# Patient Record
Sex: Female | Born: 1940 | Race: Black or African American | Hispanic: No | Smoking: Never smoker
Health system: Southern US, Community
[De-identification: ages and names within clinical notes are randomized; demographics above are authoritative.]

## PROBLEM LIST (undated history)

## (undated) DIAGNOSIS — R7303 Prediabetes: Secondary | ICD-10-CM

## (undated) DIAGNOSIS — M549 Dorsalgia, unspecified: Secondary | ICD-10-CM

## (undated) DIAGNOSIS — E079 Disorder of thyroid, unspecified: Secondary | ICD-10-CM

## (undated) DIAGNOSIS — I10 Essential (primary) hypertension: Secondary | ICD-10-CM

## (undated) DIAGNOSIS — E039 Hypothyroidism, unspecified: Secondary | ICD-10-CM

## (undated) DIAGNOSIS — F419 Anxiety disorder, unspecified: Secondary | ICD-10-CM

## (undated) DIAGNOSIS — M67919 Unspecified disorder of synovium and tendon, unspecified shoulder: Secondary | ICD-10-CM

## (undated) DIAGNOSIS — N289 Disorder of kidney and ureter, unspecified: Secondary | ICD-10-CM

## (undated) DIAGNOSIS — H269 Unspecified cataract: Secondary | ICD-10-CM

## (undated) DIAGNOSIS — E785 Hyperlipidemia, unspecified: Secondary | ICD-10-CM

## (undated) DIAGNOSIS — M1990 Unspecified osteoarthritis, unspecified site: Secondary | ICD-10-CM

## (undated) HISTORY — DX: Disorder of thyroid, unspecified: E07.9

## (undated) HISTORY — DX: Unspecified osteoarthritis, unspecified site: M19.90

## (undated) HISTORY — DX: Essential (primary) hypertension: I10

## (undated) HISTORY — DX: Dorsalgia, unspecified: M54.9

## (undated) HISTORY — DX: Disorder of kidney and ureter, unspecified: N28.9

## (undated) HISTORY — DX: Unspecified cataract: H26.9

## (undated) HISTORY — DX: Hyperlipidemia, unspecified: E78.5

---

## 2007-09-30 ENCOUNTER — Other Ambulatory Visit: Admission: RE | Admit: 2007-09-30 | Discharge: 2007-09-30 | Payer: Self-pay | Admitting: Internal Medicine

## 2007-09-30 ENCOUNTER — Encounter: Admission: RE | Admit: 2007-09-30 | Discharge: 2007-09-30 | Payer: Self-pay | Admitting: Internal Medicine

## 2007-09-30 LAB — HM PAP SMEAR

## 2007-10-16 ENCOUNTER — Encounter: Admission: RE | Admit: 2007-10-16 | Discharge: 2007-10-16 | Payer: Self-pay | Admitting: Internal Medicine

## 2007-12-31 ENCOUNTER — Ambulatory Visit: Payer: Self-pay | Admitting: Internal Medicine

## 2008-02-28 ENCOUNTER — Ambulatory Visit: Payer: Self-pay | Admitting: Internal Medicine

## 2008-06-02 ENCOUNTER — Ambulatory Visit: Payer: Self-pay | Admitting: Internal Medicine

## 2008-06-30 ENCOUNTER — Ambulatory Visit: Payer: Self-pay | Admitting: Internal Medicine

## 2008-06-30 ENCOUNTER — Encounter: Admission: RE | Admit: 2008-06-30 | Discharge: 2008-06-30 | Payer: Self-pay | Admitting: Internal Medicine

## 2008-08-25 ENCOUNTER — Ambulatory Visit: Payer: Self-pay | Admitting: Internal Medicine

## 2008-09-28 ENCOUNTER — Ambulatory Visit: Payer: Self-pay | Admitting: Internal Medicine

## 2008-10-04 ENCOUNTER — Encounter: Admission: RE | Admit: 2008-10-04 | Discharge: 2008-10-04 | Payer: Self-pay | Admitting: Internal Medicine

## 2008-10-08 HISTORY — PX: JOINT REPLACEMENT: SHX530

## 2008-10-09 ENCOUNTER — Inpatient Hospital Stay (HOSPITAL_COMMUNITY): Admission: RE | Admit: 2008-10-09 | Discharge: 2008-10-12 | Payer: Self-pay | Admitting: Orthopedic Surgery

## 2008-12-29 ENCOUNTER — Ambulatory Visit: Payer: Self-pay | Admitting: Internal Medicine

## 2009-09-06 ENCOUNTER — Ambulatory Visit: Payer: Self-pay | Admitting: Internal Medicine

## 2009-10-30 ENCOUNTER — Ambulatory Visit: Payer: Self-pay | Admitting: Internal Medicine

## 2009-10-30 ENCOUNTER — Encounter: Admission: RE | Admit: 2009-10-30 | Discharge: 2009-10-30 | Payer: Self-pay | Admitting: Internal Medicine

## 2009-10-30 LAB — HM MAMMOGRAPHY

## 2010-04-01 ENCOUNTER — Encounter: Payer: Self-pay | Admitting: Internal Medicine

## 2010-04-22 ENCOUNTER — Ambulatory Visit (INDEPENDENT_AMBULATORY_CARE_PROVIDER_SITE_OTHER): Payer: Medicare Other | Admitting: Internal Medicine

## 2010-04-22 DIAGNOSIS — E785 Hyperlipidemia, unspecified: Secondary | ICD-10-CM

## 2010-04-22 DIAGNOSIS — IMO0002 Reserved for concepts with insufficient information to code with codable children: Secondary | ICD-10-CM

## 2010-04-22 DIAGNOSIS — E119 Type 2 diabetes mellitus without complications: Secondary | ICD-10-CM

## 2010-04-22 DIAGNOSIS — E039 Hypothyroidism, unspecified: Secondary | ICD-10-CM

## 2010-06-15 LAB — CBC
HCT: 24.7 % — ABNORMAL LOW (ref 36.0–46.0)
HCT: 25.8 % — ABNORMAL LOW (ref 36.0–46.0)
HCT: 29.1 % — ABNORMAL LOW (ref 36.0–46.0)
Hemoglobin: 9.9 g/dL — ABNORMAL LOW (ref 12.0–15.0)
MCHC: 33.8 g/dL (ref 30.0–36.0)
MCHC: 33.8 g/dL (ref 30.0–36.0)
MCHC: 34 g/dL (ref 30.0–36.0)
MCV: 98 fL (ref 78.0–100.0)
Platelets: 175 10*3/uL (ref 150–400)
Platelets: 180 10*3/uL (ref 150–400)
Platelets: 217 10*3/uL (ref 150–400)
RBC: 2.52 MIL/uL — ABNORMAL LOW (ref 3.87–5.11)
RBC: 2.64 MIL/uL — ABNORMAL LOW (ref 3.87–5.11)
RBC: 2.99 MIL/uL — ABNORMAL LOW (ref 3.87–5.11)
RDW: 13.3 % (ref 11.5–15.5)
RDW: 13.4 % (ref 11.5–15.5)
WBC: 8.8 10*3/uL (ref 4.0–10.5)

## 2010-06-15 LAB — BASIC METABOLIC PANEL
BUN: 12 mg/dL (ref 6–23)
BUN: 13 mg/dL (ref 6–23)
BUN: 15 mg/dL (ref 6–23)
BUN: 16 mg/dL (ref 6–23)
CO2: 28 mEq/L (ref 19–32)
CO2: 29 mEq/L (ref 19–32)
Calcium: 8 mg/dL — ABNORMAL LOW (ref 8.4–10.5)
Chloride: 102 mEq/L (ref 96–112)
Chloride: 98 mEq/L (ref 96–112)
Creatinine, Ser: 1.37 mg/dL — ABNORMAL HIGH (ref 0.4–1.2)
Creatinine, Ser: 1.54 mg/dL — ABNORMAL HIGH (ref 0.4–1.2)
GFR calc Af Amer: 46 mL/min — ABNORMAL LOW (ref >60)
GFR calc Af Amer: 54 mL/min — ABNORMAL LOW (ref >60)
GFR calc Af Amer: 57 mL/min — ABNORMAL LOW (ref >60)
GFR calc non Af Amer: 38 mL/min — ABNORMAL LOW (ref >60)
Glucose, Bld: 101 mg/dL — ABNORMAL HIGH (ref 70–99)
Glucose, Bld: 106 mg/dL — ABNORMAL HIGH (ref 70–99)
Glucose, Bld: 158 mg/dL — ABNORMAL HIGH (ref 70–99)
Potassium: 3.5 mEq/L (ref 3.5–5.1)
Potassium: 4.2 mEq/L (ref 3.5–5.1)
Sodium: 134 mEq/L — ABNORMAL LOW (ref 135–145)
Sodium: 137 mEq/L (ref 135–145)
Sodium: 137 mEq/L (ref 135–145)

## 2010-06-15 LAB — PROTIME-INR
INR: 1.1 (ref 0.00–1.49)
Prothrombin Time: 16.2 seconds — ABNORMAL HIGH (ref 11.6–15.2)
Prothrombin Time: 18.9 seconds — ABNORMAL HIGH (ref 11.6–15.2)

## 2010-06-15 LAB — TYPE AND SCREEN

## 2010-06-16 LAB — URINALYSIS, ROUTINE W REFLEX MICROSCOPIC
Leukocytes, UA: NEGATIVE
Nitrite: NEGATIVE
Specific Gravity, Urine: 1.007 (ref 1.005–1.030)
pH: 6 (ref 5.0–8.0)

## 2010-06-16 LAB — URINE MICROSCOPIC-ADD ON

## 2010-06-16 LAB — COMPREHENSIVE METABOLIC PANEL
Albumin: 3.6 g/dL (ref 3.5–5.2)
Calcium: 9.2 mg/dL (ref 8.4–10.5)
Chloride: 102 mEq/L (ref 96–112)
Creatinine, Ser: 1.32 mg/dL — ABNORMAL HIGH (ref 0.4–1.2)
GFR calc non Af Amer: 40 mL/min — ABNORMAL LOW (ref >60)
Total Bilirubin: 0.4 mg/dL (ref 0.3–1.2)

## 2010-06-16 LAB — CBC
Hemoglobin: 11.2 g/dL — ABNORMAL LOW (ref 12.0–15.0)
RDW: 13.6 % (ref 11.5–15.5)

## 2010-06-16 LAB — APTT: aPTT: 30 seconds (ref 24–37)

## 2010-07-23 NOTE — H&P (Signed)
[name], [name]                 ACCOUNT NO.:  [id number]   MEDICAL RECORD NO.:  [id number]          PATIENT TYPE:  INP   LOCATION:                               FACILITY:  [facility]   PHYSICIAN:  Ollen Gross, M.D.    DATE OF BIRTH:  22-Mar-1940   DATE OF ADMISSION:  10/09/2008  DATE OF DISCHARGE:                              HISTORY & PHYSICAL   CHIEF COMPLAINT:  Right hip pain.   HISTORY OF PRESENT ILLNESS:  The patient is a 70 year old female who has  been seen by Dr. Lequita Halt for ongoing right hip pain which has  progressively gotten worse over time.  It has been ongoing for several  years now, starting in the groin and radiating down to the thigh.  She  has been seen in the office and x-rays show severe end-stage arthritis  with bone on bone, subchondral cystic formation.  She also had MRI that  confirmed severe degenerative changes.  It is felt she has reached the  point where she would benefit from undergoing surgical intervention.  Risks and benefits have been discussed and she would like to proceed  with surgery.   ALLERGIES:  No known drug allergies.   INTOLERANCES:  LISINOPRIL causes cough, TOPROL causes cough.   FOOD ALLERGIES:  TOMATOES which cause rash and several FRUIT PUNCHES  which cause rash.   CURRENT MEDICATIONS:  Metoprolol, amlodipine, hydrochlorothiazide,  simvastatin, levothyroxine.   PAST MEDICAL HISTORY:  Hypertension, hypercholesterolemia,  hypothyroidism, postmenopausal.  Childhood illnesses of measles, mumps.   PAST SURGICAL HISTORY:  Tubal ligation.   FAMILY HISTORY:  Father with history of prostate cancer.  Mother with  hypertension and stroke.  She has four children.   SOCIAL HISTORY:  Separated, retired, nonsmoker.  She does have a  caregiver lined up after surgery.  She is going to be going home with  her daughter which has two steps entering into her home.  She has four  steps entering into her own home.   REVIEW OF SYSTEMS:  GENERAL:  No  fevers, chills or night sweats.  NEURO:  No seizures, syncope, paralysis.  RESPIRATORY:  She has a little bit of  shortness of breath on exertion.  No shortness of breath at rest.  The  patient states this is more due to pain than anything.  No productive  cough or hemoptysis.  CARDIOVASCULAR:  No chest pain, orthopnea.  GI:  No nausea, vomiting, diarrhea or constipation.  GU:  No dysuria,  hematuria or discharge.  MUSCULOSKELETAL:  Hip pain.   PHYSICAL EXAMINATION:  VITAL SIGNS:  Pulse 68, respirations 12, blood  pressure 162/84.  GENERAL:  A 70 year old [place] female well-nourished, well-  developed, in no acute distress.  She is alert and oriented,  cooperative, pleasant, accompanied by her sister and daughter.  HEENT:  Normocephalic, atraumatic.  Pupils are round and reactive.  Noted to wear reading glasses.  EOMs intact.  NECK:  Supple.  CHEST:  Clear.  HEART:  Regular rate and rhythm.  No murmur.  S1-S2 noted.  ABDOMEN:  Soft, round, protuberant  abdomen.  Bowel sounds present.  RECTUM/BREASTS/GENITALIA:  Not done, not pertinent to present illness.  EXTREMITIES:  Right hip flexion 90-0, internal rotation 0, external  rotation about 20 degrees of abduction.  Right knee normal exam.   IMPRESSION:  Osteoarthritis right hip.   PLAN:  The patient will be admitted to [facility] to undergo a  right total hip replacement arthroplasty.  Surgery will be performed by  Dr. Ollen Gross.      [name], P.A.C.      Ollen Gross, M.D.  Electronically Signed    ALP/MEDQ  D:  10/08/2008  T:  10/08/2008  Job:  [id number]

## 2010-07-23 NOTE — Op Note (Signed)
NAMESHAROLYN, [name]                 ACCOUNT NO.:  [id number]   MEDICAL RECORD NO.:  [id number]          PATIENT TYPE:  INP   LOCATION:  0012                         FACILITY:  [facility]   PHYSICIAN:  Ollen Gross, M.D.    DATE OF BIRTH:  [birth year]   DATE OF PROCEDURE:  10/09/2008  DATE OF DISCHARGE:                               OPERATIVE REPORT   PREOPERATIVE DIAGNOSIS:  Osteoarthritis, right hip.   POSTOPERATIVE DIAGNOSIS:  Osteoarthritis, right hip.   PROCEDURE:  Right total hip arthroplasty.   SURGEON:  Aluisio.   ASSISTANT:  Rozell Searing, PA-C.   ANESTHESIA:  General.   BLOOD LOSS:  500.   DRAINS:  Hemovac x1.   COMPLICATIONS:  None.   CONDITION:  Stable to recovery.   CLINICAL NOTE:  [name] is a 70 year old female with end-stage  arthritis of the right hip with progressively worsening pain and  dysfunction.  She has failed nonoperative management and presents for  total hip arthroplasty.   PROCEDURE IN DETAIL:  After successful administration of general  anesthetic, the patient is placed in the left lateral decubitus position  with the right side up and held with the hip positioner.  Right lower  extremity is isolated from her perineum with plastic drapes and prepped  and draped in the usual sterile fashion.  A short posterolateral  incision is made with a 10 blade through the skin and then subcutaneous  tissue is incised down to the level of the fascia lata.  This is a very  thick layer of subcutaneous tissue.  Fascia lata was incised in line  with the skin incision.  The sciatic nerve is palpated and protected,  and the short rotators isolated off the femur.  Capsulotomy was  performed, and the hip is dislocated.  The center of the femoral head is  marked.  A trial prosthesis was placed, such that the center of the  trial head corresponds to the center of her native femoral head.  Osteotomy lines are marked on the femoral neck, and osteotomy made with  an  oscillating saw.  The femoral head is removed, and the femur is  retracted anteriorly to gain acetabular exposure.   Acetabular retractors were placed in the labrum, and osteophytes were  removed.  Acetabular reaming was initiated, starting at 45 mm, coursing  in increments of 2 to 51 mm, and then a 52-mm Pinnacle acetabular shell  was placed in anatomic position and transfixed with 2 dome screws.  The  32 mm neutral +4 liner was placed.   The femur was repaired with the canal finder and irrigation.  Axial  reaming was performed at 13.5 mm proximal, reaming to an 18.5 and the  sleeve machine to a large.  The 18.5 large trial sleeve was placed, and  the 18 x 13 stem, and a 36 + 8 neck, matching her native anteversion.  A  32 +0 head is placed.  The hip is reduced fairly easily using a 32 + 3  head, which had great tension.  There is excellent stability with full  extension,  full external rotation to 70 degrees flexion, 40 degrees  adduction, 90 degrees internal rotation, 90 degrees of flexion, and 70  degrees of internal rotation.  By placing the right leg on top of the  left, it felt as though the leg lengths were equal.  The hip was then  dislocated, and trials were removed.  The permanent apex hole eliminator  was placed in the acetabular shell, and permanent 32 mm neutral +4  Marathon liner was placed.  On the femoral side, the 18.5 large sleeve  is placed with the 18 x 13 stem, 36 +8 neck, matching native  anteversion.  The 32 +3 head is placed, and the hip is reduced with the  same stability parameters.  The wound was copiously irrigated with  saline solution, and then the short rotators were reattached to the  femur through drill holes.  The fascia lata is then closed over a  Hemovac drain with interrupted #1 Vicryl.  Subcu closed with #1-0 and #2-  0 Vicryl, and subcuticular running 4-0 Monocryl.  The incision is then  cleaned and dried, and Steri-Strips and a bulky sterile  dressing are  applied.  She is then placed into a knee immobilizer, awakened, and  transported to recovery in stable condition.      Ollen Gross, M.D.  Electronically Signed     FA/MEDQ  D:  10/09/2008  T:  10/09/2008  Job:  [id number]

## 2010-09-05 ENCOUNTER — Other Ambulatory Visit: Payer: Self-pay | Admitting: *Deleted

## 2010-09-05 MED ORDER — METOPROLOL SUCCINATE ER 25 MG PO TB24: 25.0000 mg | ORAL_TABLET | Freq: Every day | ORAL | Status: DC

## 2010-09-05 MED ORDER — AMLODIPINE BESYLATE 10 MG PO TABS: 10.0000 mg | ORAL_TABLET | Freq: Every day | ORAL | Status: DC

## 2010-09-16 ENCOUNTER — Other Ambulatory Visit: Payer: Self-pay | Admitting: *Deleted

## 2010-09-16 MED ORDER — SIMVASTATIN 10 MG PO TABS: 10.0000 mg | ORAL_TABLET | Freq: Every evening | ORAL | Status: DC

## 2010-09-16 MED ORDER — LEVOTHYROXINE SODIUM 75 MCG PO TABS: 75.0000 ug | ORAL_TABLET | Freq: Every day | ORAL | Status: DC

## 2010-10-18 ENCOUNTER — Encounter: Payer: Self-pay | Admitting: Internal Medicine

## 2010-10-21 ENCOUNTER — Encounter: Payer: Self-pay | Admitting: Internal Medicine

## 2010-10-21 ENCOUNTER — Ambulatory Visit (INDEPENDENT_AMBULATORY_CARE_PROVIDER_SITE_OTHER): Payer: Medicare Other | Admitting: Internal Medicine

## 2010-10-21 DIAGNOSIS — X58XXXA Exposure to other specified factors, initial encounter: Secondary | ICD-10-CM

## 2010-10-21 DIAGNOSIS — IMO0002 Reserved for concepts with insufficient information to code with codable children: Secondary | ICD-10-CM

## 2010-10-21 DIAGNOSIS — M1990 Unspecified osteoarthritis, unspecified site: Secondary | ICD-10-CM

## 2010-10-21 DIAGNOSIS — M169 Osteoarthritis of hip, unspecified: Secondary | ICD-10-CM | POA: Insufficient documentation

## 2010-10-21 DIAGNOSIS — E785 Hyperlipidemia, unspecified: Secondary | ICD-10-CM

## 2010-10-21 DIAGNOSIS — T148XXA Other injury of unspecified body region, initial encounter: Secondary | ICD-10-CM

## 2010-10-21 DIAGNOSIS — E119 Type 2 diabetes mellitus without complications: Secondary | ICD-10-CM

## 2010-10-21 DIAGNOSIS — E039 Hypothyroidism, unspecified: Secondary | ICD-10-CM

## 2010-10-21 DIAGNOSIS — I10 Essential (primary) hypertension: Secondary | ICD-10-CM

## 2010-10-21 LAB — HEPATIC FUNCTION PANEL
AST: 23 U/L (ref 0–37)
Alkaline Phosphatase: 57 U/L (ref 39–117)
Bilirubin, Direct: 0.1 mg/dL (ref 0.0–0.3)
Indirect Bilirubin: 0.4 mg/dL (ref 0.0–0.9)
Total Bilirubin: 0.5 mg/dL (ref 0.3–1.2)

## 2010-10-21 LAB — HEMOGLOBIN A1C: Hgb A1c MFr Bld: 6.2 % — ABNORMAL HIGH (ref <5.7)

## 2010-10-21 MED ORDER — TETANUS-DIPHTH-ACELL PERTUSSIS 5-2.5-18.5 LF-MCG/0.5 IM SUSP
0.5000 mL | Freq: Once | INTRAMUSCULAR | Status: AC
  Administered 2010-10-21: 0.5 mL via INTRAMUSCULAR

## 2010-10-21 NOTE — Progress Notes (Signed)
  Subjective:    Patient ID: [name], female    DOB: Mar 03, 1941, 70 y.o.   MRN: [id number]  HPI 70 year old black female with history of hypertension, hypothyroidism, osteoarthritis of back and knees, hyperlipidemia, mild renal insufficiency, adult onset diabetes mellitus in today for six-month recheck. She shows me a laceration tip of her left third finger that happened last Thursday, August 9 which she was using some hedge clippers. Likely needed sutures at that time but she did not seek medical attention. Tdap vaccine given today.    Review of Systems     Objective:   Physical Exam 1 cm laceration tip of left third finger. No evidence of secondary bacterial infection. Area around laceration is macerated.        Assessment & Plan:  Laceration left third finger distal tip  Tdap vaccine given. Duricef 500 mg daily for 7 days. Recheck in one week. He is to clean laceration twice daily with peroxide followed by small amount of Bactroban ointment. Given prescription for Bactroban ointment with prn one year refill. Recheck in one week and we'll address other medical problems at that time.

## 2010-10-22 ENCOUNTER — Other Ambulatory Visit: Payer: Self-pay | Admitting: Internal Medicine

## 2010-10-22 DIAGNOSIS — Z1231 Encounter for screening mammogram for malignant neoplasm of breast: Secondary | ICD-10-CM

## 2010-10-28 ENCOUNTER — Ambulatory Visit: Payer: Medicare Other | Admitting: Internal Medicine

## 2010-10-31 ENCOUNTER — Encounter: Payer: Self-pay | Admitting: Internal Medicine

## 2010-10-31 ENCOUNTER — Ambulatory Visit (INDEPENDENT_AMBULATORY_CARE_PROVIDER_SITE_OTHER): Payer: Medicare Other | Admitting: Internal Medicine

## 2010-10-31 VITALS — BP 130/80 | HR 70 | Temp 97.5°F | Ht 63.5 in | Wt 237.0 lb

## 2010-10-31 DIAGNOSIS — N289 Disorder of kidney and ureter, unspecified: Secondary | ICD-10-CM

## 2010-10-31 DIAGNOSIS — E039 Hypothyroidism, unspecified: Secondary | ICD-10-CM

## 2010-10-31 DIAGNOSIS — I10 Essential (primary) hypertension: Secondary | ICD-10-CM

## 2010-10-31 DIAGNOSIS — M545 Low back pain, unspecified: Secondary | ICD-10-CM

## 2010-10-31 DIAGNOSIS — E119 Type 2 diabetes mellitus without complications: Secondary | ICD-10-CM

## 2010-10-31 DIAGNOSIS — E785 Hyperlipidemia, unspecified: Secondary | ICD-10-CM

## 2010-11-07 ENCOUNTER — Other Ambulatory Visit: Payer: Self-pay | Admitting: *Deleted

## 2010-11-07 MED ORDER — AMLODIPINE BESYLATE 10 MG PO TABS: 10.0000 mg | ORAL_TABLET | Freq: Every day | ORAL | Status: DC

## 2010-11-07 MED ORDER — METOPROLOL SUCCINATE ER 25 MG PO TB24: 25.0000 mg | ORAL_TABLET | Freq: Every day | ORAL | Status: DC

## 2010-11-22 ENCOUNTER — Other Ambulatory Visit: Payer: Self-pay

## 2010-11-22 MED ORDER — SIMVASTATIN 10 MG PO TABS: 10.0000 mg | ORAL_TABLET | Freq: Every evening | ORAL | Status: DC

## 2010-12-01 DIAGNOSIS — M545 Low back pain: Secondary | ICD-10-CM | POA: Insufficient documentation

## 2010-12-01 NOTE — Progress Notes (Signed)
  Subjective:    Patient ID: [name], female    DOB: 04-Nov-1940, 70 y.o.   MRN: [id number]  HPI for followup of laceration distal left third finger which occurred August 19 for which she sought treatment here August 13. She was treated with Duricef and Bactroban. It is  healing nicely. No evidence of secondary infection. Also for six-month recheck. Blood pressure is stable on current regimen. History of hypothyroidism. TSH checked and is within normal limits on Synthroid 0.075 mg daily. History of hyperlipidemia treated with Zocor. Patient is status post right hip replacement August 2010. Has osteoarthritis in her back as well. Mild renal insufficiency and diabetes mellitus. Had urinary tract infection July 2009. Last mammogram on file August 2011. Patient refuses influenza and Pneumovax immunizations. No known drug allergies. Prinivil causes a cough.    Review of Systems     Objective:   Physical Exam neck is supple without thyromegaly JVD or carotid bruits. Chest clear to auscultation; cardiac exam regular rate and rhythm; extremities no pitting edema.        Assessment & Plan:  Hypertension  Hyperlipidemia  Hypothyroidism  Back pain  Mild renal insufficiency  Adult onset diabetes mellitus  Healing laceration distal left third finger-laceration occurred August 9  Plan is to return in 6 months for physical exam and fasting lab work

## 2010-12-17 ENCOUNTER — Other Ambulatory Visit: Payer: Self-pay | Admitting: *Deleted

## 2010-12-17 MED ORDER — LEVOTHYROXINE SODIUM 75 MCG PO TABS: 75.0000 ug | ORAL_TABLET | Freq: Every day | ORAL | Status: DC

## 2011-04-29 DIAGNOSIS — H40019 Open angle with borderline findings, low risk, unspecified eye: Secondary | ICD-10-CM | POA: Diagnosis not present

## 2011-04-29 DIAGNOSIS — H5710 Ocular pain, unspecified eye: Secondary | ICD-10-CM | POA: Diagnosis not present

## 2011-04-29 DIAGNOSIS — H353190 Nonexudative age-related macular degeneration, unspecified eye, stage unspecified: Secondary | ICD-10-CM | POA: Diagnosis not present

## 2011-04-29 DIAGNOSIS — H2510 Age-related nuclear cataract, unspecified eye: Secondary | ICD-10-CM | POA: Diagnosis not present

## 2011-05-26 ENCOUNTER — Ambulatory Visit (INDEPENDENT_AMBULATORY_CARE_PROVIDER_SITE_OTHER): Payer: Medicare Other | Admitting: Internal Medicine

## 2011-05-26 ENCOUNTER — Encounter: Payer: Self-pay | Admitting: Internal Medicine

## 2011-05-26 ENCOUNTER — Other Ambulatory Visit (HOSPITAL_COMMUNITY)
Admission: RE | Admit: 2011-05-26 | Discharge: 2011-05-26 | Disposition: A | Discharge status: Home / Self Care | Payer: Medicare Other | Source: Ambulatory Visit | Attending: Internal Medicine | Admitting: Internal Medicine

## 2011-05-26 VITALS — BP 146/86 | HR 76 | Temp 97.7°F | Ht 63.25 in | Wt 237.0 lb

## 2011-05-26 DIAGNOSIS — E559 Vitamin D deficiency, unspecified: Secondary | ICD-10-CM | POA: Diagnosis not present

## 2011-05-26 DIAGNOSIS — E039 Hypothyroidism, unspecified: Secondary | ICD-10-CM

## 2011-05-26 DIAGNOSIS — I10 Essential (primary) hypertension: Secondary | ICD-10-CM | POA: Diagnosis not present

## 2011-05-26 DIAGNOSIS — Z0000 Encounter for general adult medical examination without abnormal findings: Secondary | ICD-10-CM | POA: Diagnosis not present

## 2011-05-26 DIAGNOSIS — Z124 Encounter for screening for malignant neoplasm of cervix: Secondary | ICD-10-CM

## 2011-05-26 DIAGNOSIS — E785 Hyperlipidemia, unspecified: Secondary | ICD-10-CM

## 2011-05-26 DIAGNOSIS — R319 Hematuria, unspecified: Secondary | ICD-10-CM | POA: Diagnosis not present

## 2011-05-26 DIAGNOSIS — E119 Type 2 diabetes mellitus without complications: Secondary | ICD-10-CM | POA: Diagnosis not present

## 2011-05-26 LAB — COMPREHENSIVE METABOLIC PANEL
AST: 24 U/L (ref 0–37)
Alkaline Phosphatase: 66 U/L (ref 39–117)
BUN: 22 mg/dL (ref 6–23)
Glucose, Bld: 85 mg/dL (ref 70–99)
Potassium: 4.5 mEq/L (ref 3.5–5.3)
Sodium: 143 mEq/L (ref 135–145)
Total Bilirubin: 0.6 mg/dL (ref 0.3–1.2)

## 2011-05-26 LAB — CBC WITH DIFFERENTIAL/PLATELET
Basophils Absolute: 0 10*3/uL (ref 0.0–0.1)
Basophils Relative: 0 % (ref 0–1)
Eosinophils Absolute: 0.1 10*3/uL (ref 0.0–0.7)
Hemoglobin: 12.8 g/dL (ref 12.0–15.0)
MCH: 31.8 pg (ref 26.0–34.0)
MCHC: 33.6 g/dL (ref 30.0–36.0)
Monocytes Absolute: 0.5 10*3/uL (ref 0.1–1.0)
Monocytes Relative: 10 % (ref 3–12)
Neutro Abs: 2 10*3/uL (ref 1.7–7.7)
Neutrophils Relative %: 41 % — ABNORMAL LOW (ref 43–77)
RBC: 4.02 MIL/uL (ref 3.87–5.11)
RDW: 13.9 % (ref 11.5–15.5)
WBC: 4.8 10*3/uL (ref 4.0–10.5)

## 2011-05-26 LAB — LIPID PANEL
HDL: 50 mg/dL (ref >39)
LDL Cholesterol: 110 mg/dL — ABNORMAL HIGH (ref 0–99)
Triglycerides: 100 mg/dL (ref <150)
VLDL: 20 mg/dL (ref 0–40)

## 2011-05-26 LAB — POCT URINALYSIS DIPSTICK
Bilirubin, UA: NEGATIVE
Glucose, UA: NEGATIVE
Ketones, UA: NEGATIVE
Spec Grav, UA: 1.02
pH, UA: 5.5

## 2011-05-26 LAB — TSH: TSH: 2.875 u[IU]/mL (ref 0.350–4.500)

## 2011-05-28 LAB — URINE CULTURE

## 2011-06-09 DIAGNOSIS — E119 Type 2 diabetes mellitus without complications: Secondary | ICD-10-CM | POA: Insufficient documentation

## 2011-06-09 NOTE — Patient Instructions (Signed)
Continue same medications and return in 6 months 

## 2011-06-09 NOTE — Progress Notes (Signed)
  Subjective:    Patient ID: [name], female    DOB: [birth year], 71 y.o.   MRN: [id number]  HPI 71 year old black female mother of [name] in today for health maintenance and evaluation of medical problems. Patient refuses flu and Pneumovax immunization. Does not want to do colonoscopy. Agrees to do 3 Hemoccult cards. History of hypertension, hypothyroidism, low back pain, hyperlipidemia, chronic kidney disease, diabetes mellitus. Had right hip replacement August 2010. Had mammogram August 2011. Prinivil causes a cough. Diabetes is diet controlled at the present time. She is on statin for hyperlipidemia.  No history of serious illnesses accidents or operations prior to my assuming her care in 2009. She is a retired from an Horticulturist, commercial.  Family history: Father died at age 78 of cancer of the prostate. Mother died at age 72 of stroke with history of hypertension. One brother with history of lung cancer. Patient does not smoke or consume alcohol. She is married and completed high school.    Review of Systems noncontributory except as above     Objective:   Physical Exam  Constitutional: She is oriented to person, place, and time. She appears well-nourished. No distress.  HENT:  Head: Normocephalic and atraumatic.  Right Ear: External ear normal.  Left Ear: External ear normal.  Mouth/Throat: Oropharynx is clear and moist. No oropharyngeal exudate.  Eyes: Conjunctivae and EOM are normal. Pupils are equal, round, and reactive to light. Right eye exhibits no discharge. No scleral icterus.  Neck: Neck supple. No JVD present. No thyromegaly present.  Cardiovascular: Normal rate, regular rhythm, normal heart sounds and intact distal pulses.   No murmur heard. Pulmonary/Chest: Effort normal and breath sounds normal. No respiratory distress. She has no wheezes. She has no rales. She exhibits no tenderness.  Abdominal: Soft. Bowel sounds are normal. She exhibits no  distension and no mass. There is no tenderness. There is no rebound and no guarding.  Genitourinary:       Deferred  Musculoskeletal: Normal range of motion. She exhibits no edema.  Lymphadenopathy:    She has no cervical adenopathy.  Neurological: She is alert and oriented to person, place, and time. She has normal reflexes. She displays normal reflexes. No cranial nerve deficit. Coordination normal.  Skin: Skin is warm and dry. No rash noted. She is not diaphoretic.  Psychiatric: She has a normal mood and affect. Her behavior is normal. Judgment and thought content normal.          Assessment & Plan:  Hypertension  Adult onset diabetes diet controlled  Chronic kidney disease stage I  Hyperlipidemia  Hypothyroidism  History of low back pain  Plan: Patient is to return in 6 months and continue same medications.

## 2011-07-03 ENCOUNTER — Other Ambulatory Visit: Payer: Self-pay

## 2011-07-03 MED ORDER — FUROSEMIDE 20 MG PO TABS: 20.0000 mg | ORAL_TABLET | Freq: Two times a day (BID) | ORAL | Status: DC

## 2011-12-01 ENCOUNTER — Ambulatory Visit (INDEPENDENT_AMBULATORY_CARE_PROVIDER_SITE_OTHER): Payer: Medicare Other | Admitting: Internal Medicine

## 2011-12-01 ENCOUNTER — Other Ambulatory Visit: Payer: Self-pay | Admitting: Internal Medicine

## 2011-12-01 ENCOUNTER — Encounter: Payer: Self-pay | Admitting: Internal Medicine

## 2011-12-01 VITALS — BP 148/84 | HR 76 | Ht 63.0 in | Wt 234.0 lb

## 2011-12-01 DIAGNOSIS — E119 Type 2 diabetes mellitus without complications: Secondary | ICD-10-CM | POA: Diagnosis not present

## 2011-12-01 DIAGNOSIS — Z79899 Other long term (current) drug therapy: Secondary | ICD-10-CM

## 2011-12-01 DIAGNOSIS — E785 Hyperlipidemia, unspecified: Secondary | ICD-10-CM

## 2011-12-01 DIAGNOSIS — E039 Hypothyroidism, unspecified: Secondary | ICD-10-CM | POA: Diagnosis not present

## 2011-12-01 LAB — HEPATIC FUNCTION PANEL
AST: 23 U/L (ref 0–37)
Alkaline Phosphatase: 59 U/L (ref 39–117)
Indirect Bilirubin: 0.4 mg/dL (ref 0.0–0.9)
Total Protein: 7.9 g/dL (ref 6.0–8.3)

## 2011-12-01 LAB — LIPID PANEL
Cholesterol: 187 mg/dL (ref 0–200)
LDL Cholesterol: 115 mg/dL — ABNORMAL HIGH (ref 0–99)
Triglycerides: 115 mg/dL (ref <150)

## 2011-12-01 NOTE — Progress Notes (Signed)
  Subjective:    Patient ID: [name], female    DOB: 1940-12-26, 71 y.o.   MRN: [id number]  HPI  71 year old Black female for 6 month recheck.  Having dependent edema right ankle greater than left. Ankles painful. Has not had BP meds this am; so of course legs are swollen somewhat. Drove up from [facility] this am. No other complaints. History of Hypertension, Hyperlipidemia and Hypothyroidism. Declines influenza immunization, pneumovax, Zostavax. Declines colonoscopy but has done 3 hemoccult cards from home previously. Wants refill on Vicodin for musculoskeletal pain in hips and knees. History of right hip replacement    Review of Systems     Objective:   Physical Exam Neck supple without  bruits; Chest clear; Cor: RRR,   normal S1/S2 without murmur   Ext: trace pitting edema. Skin warm and dry. Knees without effusions but both have osteoarthritic changes        Assessment & Plan:  Dependent edema HTN- has not had antihypertensive meds or diuretic this am Hypothyroidism-TSH pending Hyperlipidemia-fasting lipid panel pending Osteoarthritis  Plan: Increase Lasix to 40 mg daily. Continue other meds. Return in one month for BP check and potassium check Vicodin 5/500 one po q 8 hours prn pain with 2 refills.

## 2011-12-01 NOTE — Patient Instructions (Addendum)
Increase Furosemide to 40 mg daily from 20 mg daily.Return in one month.

## 2011-12-02 LAB — TSH: TSH: 4.095 u[IU]/mL (ref 0.350–4.500)

## 2011-12-09 ENCOUNTER — Other Ambulatory Visit: Payer: Self-pay

## 2011-12-09 MED ORDER — SIMVASTATIN 10 MG PO TABS: 10.0000 mg | ORAL_TABLET | Freq: Every evening | ORAL | Status: DC

## 2011-12-29 ENCOUNTER — Other Ambulatory Visit: Payer: Self-pay

## 2011-12-29 MED ORDER — AMLODIPINE BESYLATE 10 MG PO TABS: 10.0000 mg | ORAL_TABLET | Freq: Every day | ORAL | Status: DC

## 2011-12-29 MED ORDER — HYDROCODONE-ACETAMINOPHEN 5-500 MG PO TABS: 1.0000 | ORAL_TABLET | Freq: Four times a day (QID) | ORAL | Status: DC | PRN

## 2012-01-05 ENCOUNTER — Ambulatory Visit
Admission: RE | Admit: 2012-01-05 | Discharge: 2012-01-05 | Disposition: A | Discharge status: Home / Self Care | Payer: Medicare Other | Source: Ambulatory Visit | Attending: Internal Medicine | Admitting: Internal Medicine

## 2012-01-05 ENCOUNTER — Ambulatory Visit (INDEPENDENT_AMBULATORY_CARE_PROVIDER_SITE_OTHER): Payer: Medicare Other | Admitting: Internal Medicine

## 2012-01-05 ENCOUNTER — Encounter: Payer: Self-pay | Admitting: Internal Medicine

## 2012-01-05 VITALS — BP 138/76 | HR 76 | Temp 96.0°F | Wt 233.5 lb

## 2012-01-05 DIAGNOSIS — I10 Essential (primary) hypertension: Secondary | ICD-10-CM | POA: Diagnosis not present

## 2012-01-05 DIAGNOSIS — R52 Pain, unspecified: Secondary | ICD-10-CM | POA: Diagnosis not present

## 2012-01-05 DIAGNOSIS — M25571 Pain in right ankle and joints of right foot: Secondary | ICD-10-CM

## 2012-01-05 DIAGNOSIS — M25579 Pain in unspecified ankle and joints of unspecified foot: Secondary | ICD-10-CM | POA: Diagnosis not present

## 2012-01-05 DIAGNOSIS — R609 Edema, unspecified: Secondary | ICD-10-CM | POA: Insufficient documentation

## 2012-01-05 DIAGNOSIS — E039 Hypothyroidism, unspecified: Secondary | ICD-10-CM

## 2012-01-05 LAB — BASIC METABOLIC PANEL WITH GFR
BUN: 16 mg/dL (ref 6–23)
CO2: 27 meq/L (ref 19–32)
Calcium: 9.5 mg/dL (ref 8.4–10.5)
Chloride: 101 meq/L (ref 96–112)
Creat: 1.39 mg/dL — ABNORMAL HIGH (ref 0.50–1.10)
Glucose, Bld: 88 mg/dL (ref 70–99)
Potassium: 3.9 meq/L (ref 3.5–5.3)
Sodium: 139 meq/L (ref 135–145)

## 2012-01-05 LAB — TSH: TSH: 2.795 u[IU]/mL (ref 0.350–4.500)

## 2012-01-05 NOTE — Patient Instructions (Addendum)
Ankle x-ray shows no subluxation or fracture. Continue Lasix 40 mg daily. Continue other medications as review sleep prescribed. TSH redrawn today. Potassium checked today. Return in 6 months.

## 2012-01-05 NOTE — Progress Notes (Signed)
  Subjective:    Patient ID: [name], female    DOB: [birth year], 71 y.o.   MRN: [id number]  HPI 71 year old black female with history of hypertension, dependent edema, hypothyroidism in today for four-week followup. Last visit blood pressure was elevated. She had not been taking her blood pressure medication prior to coming to the office. Apparently she had run out. She was complaining of right lower extremity edema. Has always had some left lower extremity edema after injuring her left ankle. We increased Lasix from 20-40 mg daily. Also at last visit TSH was in the 4.0 range. She denied noncompliance with thyroid replacement therapy but has been taking a generic preparation. We will repeat TSH today along with the meds that she is on an increased dose of Lasix. She also complaining of pain in her right medial ankle. Doesn't recall any recent injury but says it hurts often.    Review of Systems     Objective:   Physical Exam edema has improved in both ankles. Or swelling in left ankle than right. Very tender along right medial malleolus.        Assessment & Plan:  Rule out subtle fracture right medial malleolus with x-ray-x-ray shows no fracture or subluxation  Dependent edema now on increased dose of Lasix 40 mg daily. Check for hypokalemia with b- met done today Hypertension-stable and improved  Hypothyroidism-TSH repeated today. Previous TSH was in the 4.0 range. Has been getting generic thyroid replacement.  Right ankle pain. 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 She had right hip replacement previously. Ankle pain could be related to that. Advise taking Vicodin sparingly on a daily basis. She waits until the pain is unbearable before taking pain medication. She should just take it daily. Also gave her Ace wrap to wrap her ankle when necessary.  Plan: Patient return in 6 months for physical examination. Advise further after reviewing labs.

## 2012-01-06 ENCOUNTER — Other Ambulatory Visit: Payer: Self-pay

## 2012-01-06 MED ORDER — POTASSIUM CHLORIDE ER 10 MEQ PO TBCR: 10.0000 meq | EXTENDED_RELEASE_TABLET | Freq: Every day | ORAL | Status: DC

## 2012-01-22 ENCOUNTER — Other Ambulatory Visit: Payer: Self-pay

## 2012-01-22 MED ORDER — AZITHROMYCIN 250 MG PO TABS: ORAL_TABLET | ORAL | Status: DC

## 2012-02-27 ENCOUNTER — Other Ambulatory Visit: Payer: Self-pay

## 2012-02-27 MED ORDER — LEVOTHYROXINE SODIUM 75 MCG PO TABS: 75.0000 ug | ORAL_TABLET | Freq: Every day | ORAL | Status: DC

## 2012-05-07 ENCOUNTER — Ambulatory Visit (INDEPENDENT_AMBULATORY_CARE_PROVIDER_SITE_OTHER): Payer: Medicare Other | Admitting: Internal Medicine

## 2012-05-07 ENCOUNTER — Other Ambulatory Visit (HOSPITAL_COMMUNITY): Payer: Self-pay | Admitting: Cardiovascular Disease

## 2012-05-07 ENCOUNTER — Encounter: Payer: Self-pay | Admitting: Internal Medicine

## 2012-05-07 VITALS — BP 122/86 | HR 76 | Temp 97.1°F | Ht 63.0 in | Wt 236.0 lb

## 2012-05-07 DIAGNOSIS — E039 Hypothyroidism, unspecified: Secondary | ICD-10-CM

## 2012-05-07 DIAGNOSIS — R7301 Impaired fasting glucose: Secondary | ICD-10-CM | POA: Diagnosis not present

## 2012-05-07 DIAGNOSIS — I10 Essential (primary) hypertension: Secondary | ICD-10-CM

## 2012-05-07 DIAGNOSIS — E782 Mixed hyperlipidemia: Secondary | ICD-10-CM | POA: Diagnosis not present

## 2012-05-07 DIAGNOSIS — R079 Chest pain, unspecified: Secondary | ICD-10-CM

## 2012-05-07 DIAGNOSIS — E119 Type 2 diabetes mellitus without complications: Secondary | ICD-10-CM | POA: Diagnosis not present

## 2012-05-07 DIAGNOSIS — R609 Edema, unspecified: Secondary | ICD-10-CM

## 2012-05-07 LAB — HEMOGLOBIN A1C: Hgb A1c MFr Bld: 5.7 % — ABNORMAL HIGH (ref <5.7)

## 2012-05-07 LAB — CBC WITH DIFFERENTIAL/PLATELET
HCT: 37.9 % (ref 36.0–46.0)
Hemoglobin: 13.2 g/dL (ref 12.0–15.0)
Lymphs Abs: 2.1 10*3/uL (ref 0.7–4.0)
Monocytes Absolute: 0.4 10*3/uL (ref 0.1–1.0)
Monocytes Relative: 8 % (ref 3–12)
Neutro Abs: 2.6 10*3/uL (ref 1.7–7.7)
Neutrophils Relative %: 50 % (ref 43–77)
RBC: 4.14 MIL/uL (ref 3.87–5.11)

## 2012-05-07 LAB — COMPREHENSIVE METABOLIC PANEL
Albumin: 4.3 g/dL (ref 3.5–5.2)
Alkaline Phosphatase: 71 U/L (ref 39–117)
BUN: 28 mg/dL — ABNORMAL HIGH (ref 6–23)
CO2: 26 mEq/L (ref 19–32)
Calcium: 9.4 mg/dL (ref 8.4–10.5)
Chloride: 98 mEq/L (ref 96–112)
Glucose, Bld: 80 mg/dL (ref 70–99)
Potassium: 3.9 mEq/L (ref 3.5–5.3)
Sodium: 139 mEq/L (ref 135–145)
Total Protein: 8.8 g/dL — ABNORMAL HIGH (ref 6.0–8.3)

## 2012-05-07 LAB — TSH: TSH: 2.875 u[IU]/mL (ref 0.350–4.500)

## 2012-05-07 NOTE — Progress Notes (Signed)
Subjective:    Patient ID: [name], female    DOB: 09-20-1940, 72 y.o.   MRN: [id number]  HPI 72 year old black female with history of hypertension, dependent edema, hypothyroidism who had an episode of right anterior chest pain early February. It awakened her from sleep and initially started below her right breast. It spread up over her right breast in her right anterior chest and neck. She said the pain was very sharp but did not last long. She got up to drink some water which she said relieved it. After that she got a headache. Says she had another similar less severe episode sometime in January. Once again it awakened her from sleep and had a similar distribution. She is worried about this. Her daughter who works as an Glass blower/designer for low back or is planning to have orthopedic surgery March 26 and wants this taken care of right away. Patient thinks is related to taking potassium supplement. She says potassium supplement gives her flatus .She is on diuretic therapy for dependent edema. I do not want to stop potassium supplement but it is possible for the pharmacist to give her another brand that might cause less gas. Patient had no nausea or vomiting with this episode. No significant diaphoresis. No water brash. Doesn't recall what she ate before going to bed. No prior history of heart disease. She does have some impaired glucose tolerance.   Review of Systems     Objective:   Physical Exam  Vitals reviewed. Constitutional: She is oriented to person, place, and time. She appears well-developed and well-nourished. No distress.  HENT:  Head: Normocephalic and atraumatic.  Right Ear: External ear normal.  Left Ear: External ear normal.  Chronically scarred TMs  Eyes: Conjunctivae and EOM are normal. Pupils are equal, round, and reactive to light. Left eye exhibits no discharge. No scleral icterus.  Neck: No JVD present. No thyromegaly present.  Cardiovascular: Normal rate, regular  rhythm, normal heart sounds and intact distal pulses.   No murmur heard. Pulmonary/Chest: Effort normal and breath sounds normal. No respiratory distress. She has no wheezes. She has no rales. She exhibits no tenderness.  No chest wall tenderness to palpation along parasternal areas or rib areas on the right. Breast fibrocystic changes without masses  Abdominal: Soft. Bowel sounds are normal. She exhibits no distension and no mass. There is no tenderness. There is no rebound and no guarding.  Musculoskeletal: She exhibits edema.  Patient complains of decreased range of motion left upper extremity.  Lymphadenopathy:    She has no cervical adenopathy.  Neurological: She is alert and oriented to person, place, and time. She has normal reflexes. No cranial nerve deficit.  Skin: Skin is warm and dry. She is not diaphoretic.  Psychiatric: She has a normal mood and affect. Her behavior is normal. Judgment and thought content normal.          Assessment & Plan:  Right chest pain-etiology unclear. Pain actually started below right breast but not really in right upper abdomen. She does have some risk factors for coronary disease. Daughter is concerned. Also with pain being in right diaphragmatic area possibly could represent gallbladder disease. We are going to arrange for cardiology consultation and ultrasound of the gallbladder. We have drawn CBC with differential C. met TSH and hemoglobin A1c today. EKG shows no acute changes. Would like for her to have a chest x-ray and ultrasound of the right upper abdomen.  No change in medications at this point  in time. Symptoms do not sound like GE reflux to make given the right-sided numbness of this discomfort. Possibly could be musculoskeletal pain.  Hypertension  Hypothyroidism  Dependent edema  Hyperlipidemia  Impaired glucose tolerance  Plan: Arrange for cardiology consultation, ultrasound of the right abdomen, chest x-ray. Lab work drawn.

## 2012-05-07 NOTE — Patient Instructions (Addendum)
Lab work has been drawn. EKG shows no acute changes. Arrange for cardiology consultation and ultrasound of the right upper quadrant. These chest x-ray.

## 2012-05-11 ENCOUNTER — Encounter (HOSPITAL_COMMUNITY): Payer: Medicare Other

## 2012-05-11 ENCOUNTER — Telehealth: Payer: Self-pay

## 2012-05-11 DIAGNOSIS — R079 Chest pain, unspecified: Secondary | ICD-10-CM

## 2012-05-11 NOTE — Telephone Encounter (Signed)
Patient scheduled for abdominal u/s on 05/21/12 at 8:45am at [address]. Daughter Silvio Pate informed.

## 2012-05-16 DIAGNOSIS — E785 Hyperlipidemia, unspecified: Secondary | ICD-10-CM | POA: Diagnosis not present

## 2012-05-16 DIAGNOSIS — I10 Essential (primary) hypertension: Secondary | ICD-10-CM | POA: Diagnosis not present

## 2012-05-16 DIAGNOSIS — R42 Dizziness and giddiness: Secondary | ICD-10-CM | POA: Diagnosis not present

## 2012-05-18 ENCOUNTER — Telehealth: Payer: Self-pay

## 2012-05-18 DIAGNOSIS — E039 Hypothyroidism, unspecified: Secondary | ICD-10-CM | POA: Diagnosis not present

## 2012-05-18 DIAGNOSIS — E7800 Pure hypercholesterolemia, unspecified: Secondary | ICD-10-CM | POA: Diagnosis not present

## 2012-05-18 DIAGNOSIS — Y92009 Unspecified place in unspecified non-institutional (private) residence as the place of occurrence of the external cause: Secondary | ICD-10-CM | POA: Diagnosis not present

## 2012-05-18 DIAGNOSIS — I10 Essential (primary) hypertension: Secondary | ICD-10-CM | POA: Diagnosis not present

## 2012-05-18 DIAGNOSIS — S139XXA Sprain of joints and ligaments of unspecified parts of neck, initial encounter: Secondary | ICD-10-CM | POA: Diagnosis not present

## 2012-05-18 DIAGNOSIS — R5383 Other fatigue: Secondary | ICD-10-CM | POA: Diagnosis not present

## 2012-05-18 DIAGNOSIS — T1490XA Injury, unspecified, initial encounter: Secondary | ICD-10-CM | POA: Diagnosis not present

## 2012-05-18 DIAGNOSIS — N390 Urinary tract infection, site not specified: Secondary | ICD-10-CM | POA: Diagnosis not present

## 2012-05-18 DIAGNOSIS — R5381 Other malaise: Secondary | ICD-10-CM | POA: Diagnosis not present

## 2012-05-18 DIAGNOSIS — R42 Dizziness and giddiness: Secondary | ICD-10-CM | POA: Diagnosis not present

## 2012-05-18 NOTE — Telephone Encounter (Signed)
Patient's daughter Silvio Pate calls today to report that patient fell outside her home on Saturday. Thinks she fell backwards, but no one witnessed the fall. Patient having dizziness and woozy-headed. Family member took her to an Urgent Care, but no tests were done. They told her to followup with PCP if no better. She is not any better. Advised, per Dr. Lenord Fellers to have someone take her to a local hospital for further evaluation.

## 2012-05-21 ENCOUNTER — Ambulatory Visit: Payer: Medicare Other | Admitting: Internal Medicine

## 2012-05-21 ENCOUNTER — Ambulatory Visit
Admission: RE | Admit: 2012-05-21 | Discharge: 2012-05-21 | Disposition: A | Discharge status: Home / Self Care | Payer: Medicare Other | Source: Ambulatory Visit | Attending: Internal Medicine | Admitting: Internal Medicine

## 2012-05-21 ENCOUNTER — Ambulatory Visit (HOSPITAL_COMMUNITY)
Admission: RE | Admit: 2012-05-21 | Discharge: 2012-05-21 | Disposition: A | Discharge status: Home / Self Care | Payer: Medicare Other | Source: Ambulatory Visit | Attending: Cardiovascular Disease | Admitting: Cardiovascular Disease

## 2012-05-21 ENCOUNTER — Encounter: Payer: Self-pay | Admitting: Internal Medicine

## 2012-05-21 VITALS — BP 138/88 | HR 76 | Temp 97.9°F | Wt 236.0 lb

## 2012-05-21 DIAGNOSIS — N390 Urinary tract infection, site not specified: Secondary | ICD-10-CM

## 2012-05-21 DIAGNOSIS — H8110 Benign paroxysmal vertigo, unspecified ear: Secondary | ICD-10-CM

## 2012-05-21 DIAGNOSIS — R079 Chest pain, unspecified: Secondary | ICD-10-CM | POA: Diagnosis not present

## 2012-05-21 DIAGNOSIS — E119 Type 2 diabetes mellitus without complications: Secondary | ICD-10-CM | POA: Diagnosis not present

## 2012-05-21 DIAGNOSIS — R1011 Right upper quadrant pain: Secondary | ICD-10-CM | POA: Diagnosis not present

## 2012-05-21 DIAGNOSIS — R300 Dysuria: Secondary | ICD-10-CM

## 2012-05-21 LAB — POCT URINALYSIS DIPSTICK
Glucose, UA: NEGATIVE
Spec Grav, UA: 1.015
Urobilinogen, UA: NEGATIVE
pH, UA: 5.5

## 2012-05-21 MED ORDER — REGADENOSON 0.4 MG/5ML IV SOLN
0.4000 mg | Freq: Once | INTRAVENOUS | Status: AC
  Administered 2012-05-21: 0.4 mg via INTRAVENOUS

## 2012-05-21 MED ORDER — TECHNETIUM TC 99M SESTAMIBI GENERIC - CARDIOLITE
30.0000 | Freq: Once | INTRAVENOUS | Status: AC | PRN
  Administered 2012-05-21: 30 via INTRAVENOUS

## 2012-05-21 MED ORDER — TECHNETIUM TC 99M SESTAMIBI GENERIC - CARDIOLITE
10.0000 | Freq: Once | INTRAVENOUS | Status: AC | PRN
  Administered 2012-05-21: 10 via INTRAVENOUS

## 2012-05-21 NOTE — Procedures (Addendum)
[facility] [address] [place] [address] [place] [address] [phone]  Cardiology Nuclear Med Study  KERRIGAN GOMBOS is a 72 y.o. female     MRN : [id number]     DOB: 28-May-1940  Procedure Date: 05/21/2012  Nuclear Med Background Indication for Stress Test:  Evaluation for Ischemia History:  NO PRIOR CARDIAC HISTORY. Cardiac Risk Factors: Hypertension, Lipids, NIDDM and Obesity  Symptoms:  Chest Pain and Dizziness   Nuclear Pre-Procedure Caffeine/Decaff Intake:  12:00am NPO After: 10 AM   IV Site: R Antecubital  IV 0.9% NS with Angio Cath:  22g  Chest Size (in):  N/A IV Started by: Emmit Pomfret, RN  Height: 5\' 6"  (1.676 m)  Cup Size: D  BMI:  Body mass index is 38.11 kg/(m^2). Weight:  236 lb (107.049 kg)   Tech Comments:  N/A    Nuclear Med Study 1 or 2 day study: 1 day  Stress Test Type:  Lexiscan  Order Authorizing Provider:  Thurmon Fair, MD    Resting Radionuclide: Technetium 99m Sestamibi  Resting Radionuclide Dose: 11.0 mCi   Stress Radionuclide:  Technetium 52m Sestamibi  Stress Radionuclide Dose: 31.1 mCi           Stress Protocol Rest HR: 64 Stress HR: 91  Rest BP: 144/86 Stress BP: 149/71  Exercise Time (min): n/a METS: n/a   Predicted Max HR: 148 bpm % Max HR: 61.49 bpm Rate Pressure Product: 46962  Dose of Adenosine (mg):  n/a Dose of Lexiscan: 0.4 mg  Dose of Atropine (mg): n/a Dose of Dobutamine: n/a mcg/kg/min (at max HR)  Stress Test Technologist: Esperanza Sheets, CCT Nuclear Technologist: Koren Shiver, CNMT   Rest Procedure:  Myocardial perfusion imaging was performed at rest 45 minutes following the intravenous administration of Technetium 94m Sestamibi. Stress Procedure:  The patient received IV Lexiscan 0.4 mg over 15-seconds.  Technetium 61m Sestamibi injected at 30-seconds.  There were no significant changes with Lexiscan.  Quantitative spect images were obtained after a 45 minute  delay.  Transient Ischemic Dilatation (Normal <1.22):  1.14 Lung/Heart Ratio (Normal <0.45):  0.35 QGS EDV:  61 ml QGS ESV:  14 ml LV Ejection Fraction: 77%  Rest ECG: NSR - Normal EKG  Stress ECG: No significant change from baseline ECG  QPS Raw Data Images:  Normal; no motion artifact; normal heart/lung ratio. Stress Images:  Normal homogeneous uptake in all areas of the myocardium. Rest Images:  Normal homogeneous uptake in all areas of the myocardium. Subtraction (SDS):  No evidence of ischemia.  Impression Exercise Capacity:  Lexiscan with no exercise. BP Response:  Hypotensive blood pressure response. Clinical Symptoms:  Patient felt "warm" ECG Impression:  No significant ECG changes with Lexiscan. Comparison with Prior Nuclear Study: No previous nuclear study performed  Overall Impression:  Normal stress nuclear study.  LV Wall Motion:  NL LV Function; NL Wall Motion; EF 77%  Chrystie Nose, MD, [facility] Board Certified in Nuclear Cardiology Attending Cardiologist [facility]   Chrystie Nose, MD  05/22/2012 2:43 PM

## 2012-05-21 NOTE — Progress Notes (Signed)
  Subjective:    Patient ID: [name], female    DOB: 08-20-1940, 72 y.o.   MRN: [id number]  HPI 72 year old Black female with history of hypertension, hyperlipidemia and hypothyroidism was at her home this past weekend when she noticed a strange dog on her porch. She went out to run the dog away and he ground at her and tried to approach her. She was backing down her front steps and fell on the ground. Says she did not strike her he had. The dog came after her. She screamed and some neighbors came and ran the dog away. Since then she's had issues with room spinning dizziness and positional vertigo. [facility] Her daughter later called on Monday March 10 explaining what had happened. We advised she be seen in the emergency department have a CT done. She went to [facility] in [place] on 05/18/2012 for evaluation. Had CT of the head and neck. Had no cervical spine fracture. No intracranial abnormality noted on CT. She was prescribed Antivert. She's been taking that and is a bit better but still having some positional vertigo. No headache. Says she was diagnosed with "whiplash". She had lab work consisting of a CBC which was within normal limits. Urinalysis was abnormal. Culture was done. She was started on Macrobid but she did not have prescription filled. BUN was 15, creatinine 1.3. Estimated GFR 49 cc per minute. Urine culture grew 100,000 col/ml strep agalactiae. Sensitivities were not done.  Her daughter is getting ready to have hip replacement surgery. Patient seems anxious about her issues. She recently had some issues with abdominal and chest pain. Had stress test done today a cardiology office. Had ultrasound of the gallbladder which was negative.    Review of Systems     Objective:   Physical Exam PERRLA. Alert and oriented x3. Extraocular movements are full. No nystagmus. Pharynx is clear. TMs are clear. Neck is supple. Tenderness in left  sternocleidomastoid muscle. Cranial nerves II through XII grossly intact. No focal deficits on brief neurological exam. Moves all 4 extremities. Ambulation is okay. Chest clear. Cardiac exam regular rate and rhythm. Neck without bruits.  Urinalysis today repeated. It is abnormal. Culture sent        Assessment & Plan:  Benign positional vertigo secondary to fall  Hypertension  Hyperlipidemia  Anxiety  UTI  Hypothyroidism  Plan: Defer physical examination mental September 2014. She was due to have physical exam March 24. She's had considerable evaluation done this past few weeks. Started on Cipro 250 mg twice daily for 7 days for UTI. Antivert 25 mg 3 times daily when necessary vertigo with no refill #30. Reassure patient.

## 2012-05-22 NOTE — Patient Instructions (Addendum)
Take Antivert 3 times daily as needed for vertigo. Take Cipro 250 mg twice daily for 7 days for urinary tract infection.

## 2012-05-23 LAB — URINE CULTURE

## 2012-05-31 ENCOUNTER — Encounter: Payer: Medicare Other | Admitting: Internal Medicine

## 2012-06-28 ENCOUNTER — Encounter: Payer: Medicare Other | Admitting: Internal Medicine

## 2012-07-13 ENCOUNTER — Ambulatory Visit (INDEPENDENT_AMBULATORY_CARE_PROVIDER_SITE_OTHER): Payer: Medicare Other | Admitting: Internal Medicine

## 2012-07-13 ENCOUNTER — Encounter: Payer: Self-pay | Admitting: Internal Medicine

## 2012-07-13 VITALS — BP 148/80 | HR 80 | Temp 97.9°F | Wt 235.0 lb

## 2012-07-13 DIAGNOSIS — B372 Candidiasis of skin and nail: Secondary | ICD-10-CM | POA: Diagnosis not present

## 2012-07-13 NOTE — Patient Instructions (Addendum)
Use Lotrisone cream under breast twice daily as needed.

## 2012-07-13 NOTE — Progress Notes (Signed)
  Subjective:    Patient ID: [name], female    DOB: 06-18-1940, 72 y.o.   MRN: [id number]  HPI Patient says she has had a rash under her breasts since having had recent ultrasound of her heart. Says rash is been very itchy particularly at night. She's tried Benadryl without success.    Review of Systems     Objective:   Physical Exam Erythema under both breasts consistent with intertrigo. No papular lesions noted. No excoriations.        Assessment & Plan:    Intertrigo  Plan: Lotrisone cream 30 g to use under breasts twice daily with refills

## 2012-11-11 ENCOUNTER — Encounter: Payer: Self-pay | Admitting: Internal Medicine

## 2012-11-29 ENCOUNTER — Encounter: Payer: Self-pay | Admitting: Internal Medicine

## 2012-11-29 ENCOUNTER — Ambulatory Visit (INDEPENDENT_AMBULATORY_CARE_PROVIDER_SITE_OTHER): Payer: Medicare Other | Admitting: Internal Medicine

## 2012-11-29 VITALS — BP 142/84 | HR 80 | Ht 63.0 in | Wt 231.0 lb

## 2012-11-29 DIAGNOSIS — Z0000 Encounter for general adult medical examination without abnormal findings: Secondary | ICD-10-CM | POA: Diagnosis not present

## 2012-11-29 DIAGNOSIS — E119 Type 2 diabetes mellitus without complications: Secondary | ICD-10-CM

## 2012-11-29 DIAGNOSIS — Z130 Encounter for screening for diseases of the blood and blood-forming organs and certain disorders involving the immune mechanism: Secondary | ICD-10-CM

## 2012-11-29 DIAGNOSIS — Z13220 Encounter for screening for lipoid disorders: Secondary | ICD-10-CM | POA: Diagnosis not present

## 2012-11-29 DIAGNOSIS — E669 Obesity, unspecified: Secondary | ICD-10-CM | POA: Diagnosis not present

## 2012-11-29 DIAGNOSIS — I10 Essential (primary) hypertension: Secondary | ICD-10-CM | POA: Diagnosis not present

## 2012-11-29 DIAGNOSIS — M899 Disorder of bone, unspecified: Secondary | ICD-10-CM | POA: Diagnosis not present

## 2012-11-29 DIAGNOSIS — R609 Edema, unspecified: Secondary | ICD-10-CM | POA: Diagnosis not present

## 2012-11-29 DIAGNOSIS — E039 Hypothyroidism, unspecified: Secondary | ICD-10-CM | POA: Diagnosis not present

## 2012-11-29 LAB — COMPREHENSIVE METABOLIC PANEL
ALT: 13 U/L (ref 0–35)
AST: 24 U/L (ref 0–37)
Albumin: 4 g/dL (ref 3.5–5.2)
Alkaline Phosphatase: 64 U/L (ref 39–117)
Potassium: 4.1 mEq/L (ref 3.5–5.3)
Sodium: 140 mEq/L (ref 135–145)
Total Protein: 8.2 g/dL (ref 6.0–8.3)

## 2012-11-29 LAB — CBC WITH DIFFERENTIAL/PLATELET
Basophils Absolute: 0 10*3/uL (ref 0.0–0.1)
Basophils Relative: 0 % (ref 0–1)
Hemoglobin: 12.6 g/dL (ref 12.0–15.0)
MCHC: 34.1 g/dL (ref 30.0–36.0)
Neutro Abs: 2.1 10*3/uL (ref 1.7–7.7)
Neutrophils Relative %: 43 % (ref 43–77)
RDW: 14.2 % (ref 11.5–15.5)
WBC: 4.8 10*3/uL (ref 4.0–10.5)

## 2012-11-29 LAB — POCT URINALYSIS DIPSTICK
Glucose, UA: NEGATIVE
Ketones, UA: NEGATIVE
Leukocytes, UA: NEGATIVE
Protein, UA: NEGATIVE
Urobilinogen, UA: NEGATIVE

## 2012-11-29 LAB — TSH: TSH: 2.317 u[IU]/mL (ref 0.350–4.500)

## 2012-11-29 LAB — HEMOGLOBIN A1C
Hgb A1c MFr Bld: 5.9 % — ABNORMAL HIGH (ref <5.7)
Mean Plasma Glucose: 123 mg/dL — ABNORMAL HIGH (ref <117)

## 2012-11-29 LAB — LIPID PANEL: LDL Cholesterol: 110 mg/dL — ABNORMAL HIGH (ref 0–99)

## 2012-11-29 MED ORDER — AMLODIPINE BESYLATE 5 MG PO TABS: 5.0000 mg | ORAL_TABLET | Freq: Every day | ORAL | Status: DC

## 2012-11-29 MED ORDER — HYDROCODONE-ACETAMINOPHEN 5-500 MG PO TABS: 1.0000 | ORAL_TABLET | Freq: Three times a day (TID) | ORAL | Status: DC | PRN

## 2012-11-29 NOTE — Progress Notes (Signed)
Subjective:    Patient ID: [name], female    DOB: Sep 12, 1940, 72 y.o.   MRN: [id number]  HPI  72 year old Black female mother of Philis Doke for health maintenance and evaluation of medical problems. Needs handicapped parking permit signed. Ambulatory issues due to osteoarthritis. She does not want to do colonoscopy. Agrees to do 3 Hemoccult cards.  History of hypertension, hypothyroidism, low back pain, osteoarthritis, hyperlipidemia, chronic kidney disease stage I, controlled type 2 diabetes mellitus.  Prinivil causes a cough  She is on statin for hyperlipidemia and diabetes is diet controlled at the present time.  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  No history of serious illnesses, accidents or operations before I assumed her care in 2009.  Social history: She is retired from an Horticulturist, commercial. Resides alone. Patient does not smoke or consume alcohol. She owns her on home and does her own house and yard work. She is married and completed high school. However husband is in a nursing home here in [place] with history of diabetes status post leg amputation.  Family history: Father died at age 30 of prostate cancer. Mother died at age 73 of stroke with history of hypertension. One brother with history of lung cancer.  Fasting lab results are pending as they were drawn today. Has not had recent mammogram. Order given today.    Review of Systems  Constitutional: Negative for appetite change and fatigue.  HENT: Positive for rhinorrhea and sneezing.   Eyes: Negative.   Respiratory: Negative for cough, chest tightness and wheezing.   Cardiovascular: Positive for leg swelling. Negative for chest pain.  Endocrine:       History of DM, hypothyroidism  Allergic/Immunologic: Negative for environmental allergies and food allergies.  Hematological: Negative for adenopathy. Bruises/bleeds easily.       Objective:   Physical Exam  Vitals reviewed. Constitutional:  She is oriented to person, place, and time. She appears well-developed and well-nourished. No distress.  HENT:  Head: Normocephalic and atraumatic.  Right Ear: External ear normal.  Left Ear: External ear normal.  Mouth/Throat: Oropharynx is clear and moist. No oropharyngeal exudate.  Eyes: Conjunctivae and EOM are normal. Pupils are equal, round, and reactive to light. Right eye exhibits no discharge. Left eye exhibits no discharge. No scleral icterus.  Neck: Normal range of motion. Neck supple. No thyromegaly present.  Cardiovascular: Normal rate, regular rhythm, normal heart sounds and intact distal pulses.   No murmur heard. Pulmonary/Chest: Effort normal and breath sounds normal. No respiratory distress. She has no wheezes. She has no rales. She exhibits no tenderness.  Breasts normal female  Abdominal: Soft. Bowel sounds are normal. She exhibits no distension and no mass. There is no tenderness. There is no rebound and no guarding.  Genitourinary:  deferred  Musculoskeletal: Normal range of motion. She exhibits edema.  Lymphadenopathy:    She has no cervical adenopathy.  Neurological: She is alert and oriented to person, place, and time. She has normal reflexes. No cranial nerve deficit. Coordination normal.  Skin: Skin is warm and dry. No rash noted. She is not diaphoretic.  Psychiatric: She has a normal mood and affect. Her behavior is normal. Judgment and thought content normal.          Assessment & Plan:  HTN- BP is normal in supine position. She will continue to monitor at home AODM-hemoglobin A1c pending Hypothyroidism-TSH pending Hyperlipidemia-on statin therapy Chronic kidney disease stage I-BUN/creatinine pending Osteoarthritis-hydrocodone/APAP refilled Dependent edema- decrease Norvasc  to 5 mg daily. Continue other medications as previously prescribed  Plan: Return in 6 months for office visit, hemoglobin A1c, TSH, lipid panel liver functions  Subjective:    Patient presents for Medicare Annual/Subsequent preventive examination.   Review Past Medical/Family/Social: see above   Risk Factors  Current exercise habits:  Walks some, does yard work Dietary issues discussed: low fat low carb  Cardiac risk factors: HTN, AODM  Depression Screen  (Note: if answer to either of the following is "Yes", a more complete depression screening is indicated)   Over the past two weeks, have you felt down, depressed or hopeless? No  Over the past two weeks, have you felt little interest or pleasure in doing things? No Have you lost interest or pleasure in daily life? No Do you often feel hopeless? No Do you cry easily over simple problems? No   Activities of Daily Living  In your present state of health, do you have any difficulty performing the following activities?:   Driving? No  Managing money? No  Feeding yourself? No  Getting from bed to chair? No  Climbing a flight of stairs? No  Preparing food and eating?: No  Bathing or showering? No  Getting dressed: No  Getting to the toilet? No  Using the toilet:No  Moving around from place to place: No  In the past year have you fallen or had a near fall?:No  Are you sexually active? No  Do you have more than one partner? No   Hearing Difficulties: No  Do you often ask people to speak up or repeat themselves? No  Do you experience ringing or noises in your ears? No  Do you have difficulty understanding soft or whispered voices? No  Do you feel that you have a problem with memory? No Do you often misplace items? No    Home Safety:  Do you have a smoke alarm at your residence?  Get batteries changed Do you have grab bars in the bathroom? no Do you have throw rugs in your house? no   Cognitive Testing  Alert? Yes Normal Appearance?Yes  Oriented to person? Yes Place? Yes  Time? Yes  Recall of three objects? Yes  Can perform simple calculations? Yes  Displays appropriate judgment?Yes  Can  read the correct time from a watch face?Yes   List the Names of Other Physician/Practitioners you currently use:  See referral list for the physicians patient is currently seeing.    NONE    Review of Systems: see EPIC   Objective:     General appearance: Appears stated age and mildly obese  Head: Normocephalic, without obvious abnormality, atraumatic  Eyes: conj clear, EOMi PEERLA  Ears: normal TM's and external ear canals both ears  Nose: Nares normal. Septum midline. Mucosa normal. No drainage or sinus tenderness.  Throat: lips, mucosa, and tongue normal; teeth and gums normal  Neck: no adenopathy, no carotid bruit, no JVD, supple, symmetrical, trachea midline and thyroid not enlarged, symmetric, no tenderness/mass/nodules  No CVA tenderness.  Lungs: clear to auscultation bilaterally  Breasts: normal appearance, no masses or tenderness Heart: regular rate and rhythm, S1, S2 normal, no murmur, click, rub or gallop  Abdomen: soft, non-tender; bowel sounds normal; no masses, no organomegaly  Musculoskeletal: ROM normal in all joints, no crepitus, no deformity, Normal muscle strengthen. Back  is symmetric, no curvature. Skin: Skin color, texture, turgor normal. No rashes or lesions  Lymph nodes: Cervical, supraclavicular, and axillary nodes normal.  Neurologic: CN  2 -12 Normal, Normal symmetric reflexes. Normal coordination and gait  Psych: Alert & Oriented x 3, Mood appear stable.    Assessment:    Annual wellness medicare exam   Plan:    During the course of the visit the patient was educated and counseled about appropriate screening and preventive services including:  Needs eye exam Needs mammogram-order written 3 hemoccult cards Declines flu vaccine Declines Pneumovax  Declines Zostavax       Patient Instructions (the written plan) was given to the patient.  Medicare Attestation  I have personally reviewed:  The patient's medical and social history  Their  use of alcohol, tobacco or illicit drugs  Their current medications and supplements  The patient's functional ability including ADLs,fall risks, home safety risks, cognitive, and hearing and visual impairment  Diet and physical activities  Evidence for depression or mood disorders  The patient's weight, height, BMI, and visual acuity have been recorded in the chart. I have made referrals, counseling, and provided education to the patient based on review of the above and I have provided the patient with a written personalized care plan for preventive services.

## 2012-11-29 NOTE — Patient Instructions (Addendum)
Do 3 hemoccult cards. Declines flu vaccine. Refilled Hydrocodone/ APAP. Labs are pending. Return in 6 months. Change amlodipine from 10  to 5 mg daily.

## 2012-11-30 LAB — MICROALBUMIN, URINE: Microalb, Ur: 2.41 mg/dL — ABNORMAL HIGH (ref 0.00–1.89)

## 2012-12-06 ENCOUNTER — Other Ambulatory Visit: Payer: Self-pay

## 2012-12-06 MED ORDER — FUROSEMIDE 40 MG PO TABS: 40.0000 mg | ORAL_TABLET | Freq: Every day | ORAL | Status: DC

## 2012-12-09 ENCOUNTER — Telehealth: Payer: Self-pay

## 2012-12-09 DIAGNOSIS — Z1239 Encounter for other screening for malignant neoplasm of breast: Secondary | ICD-10-CM

## 2012-12-13 ENCOUNTER — Telehealth (INDEPENDENT_AMBULATORY_CARE_PROVIDER_SITE_OTHER): Payer: Medicare Other

## 2012-12-13 DIAGNOSIS — Z1211 Encounter for screening for malignant neoplasm of colon: Secondary | ICD-10-CM

## 2012-12-13 LAB — HEMOCCULT GUIAC POC 1CARD (OFFICE)

## 2012-12-13 NOTE — Telephone Encounter (Signed)
Hemoccults 6 of 6 all negative

## 2013-01-07 ENCOUNTER — Other Ambulatory Visit: Payer: Self-pay | Admitting: Internal Medicine

## 2013-01-07 ENCOUNTER — Ambulatory Visit
Admission: RE | Admit: 2013-01-07 | Discharge: 2013-01-07 | Disposition: A | Discharge status: Home / Self Care | Payer: Medicare Other | Source: Ambulatory Visit | Attending: Internal Medicine | Admitting: Internal Medicine

## 2013-01-07 ENCOUNTER — Ambulatory Visit: Admission: RE | Admit: 2013-01-07 | Payer: Medicare Other | Source: Ambulatory Visit

## 2013-01-07 DIAGNOSIS — Z0000 Encounter for general adult medical examination without abnormal findings: Secondary | ICD-10-CM

## 2013-01-07 DIAGNOSIS — Z1231 Encounter for screening mammogram for malignant neoplasm of breast: Secondary | ICD-10-CM

## 2013-01-21 ENCOUNTER — Ambulatory Visit: Payer: Medicare Other

## 2013-02-17 ENCOUNTER — Ambulatory Visit (INDEPENDENT_AMBULATORY_CARE_PROVIDER_SITE_OTHER): Payer: Medicare Other | Admitting: Internal Medicine

## 2013-02-17 ENCOUNTER — Encounter: Payer: Self-pay | Admitting: Internal Medicine

## 2013-02-17 VITALS — BP 170/90 | HR 68 | Temp 97.8°F | Ht 63.0 in | Wt 225.0 lb

## 2013-02-17 DIAGNOSIS — R21 Rash and other nonspecific skin eruption: Secondary | ICD-10-CM | POA: Diagnosis not present

## 2013-02-17 DIAGNOSIS — F411 Generalized anxiety disorder: Secondary | ICD-10-CM

## 2013-02-17 DIAGNOSIS — I10 Essential (primary) hypertension: Secondary | ICD-10-CM

## 2013-02-17 MED ORDER — ALPRAZOLAM 0.5 MG PO TBDP: 0.5000 mg | ORAL_TABLET | Freq: Every evening | ORAL | Status: DC | PRN

## 2013-04-01 ENCOUNTER — Ambulatory Visit: Payer: Medicare Other | Admitting: Internal Medicine

## 2013-06-03 ENCOUNTER — Encounter: Payer: Self-pay | Admitting: Internal Medicine

## 2013-06-03 ENCOUNTER — Ambulatory Visit (INDEPENDENT_AMBULATORY_CARE_PROVIDER_SITE_OTHER): Payer: Medicare Other | Admitting: Internal Medicine

## 2013-06-03 VITALS — BP 140/90 | HR 80 | Temp 98.0°F | Wt 226.0 lb

## 2013-06-03 DIAGNOSIS — E785 Hyperlipidemia, unspecified: Secondary | ICD-10-CM

## 2013-06-03 DIAGNOSIS — H8110 Benign paroxysmal vertigo, unspecified ear: Secondary | ICD-10-CM

## 2013-06-03 DIAGNOSIS — M19019 Primary osteoarthritis, unspecified shoulder: Secondary | ICD-10-CM | POA: Diagnosis not present

## 2013-06-03 DIAGNOSIS — M19012 Primary osteoarthritis, left shoulder: Principal | ICD-10-CM

## 2013-06-03 DIAGNOSIS — I10 Essential (primary) hypertension: Secondary | ICD-10-CM | POA: Diagnosis not present

## 2013-06-03 DIAGNOSIS — E119 Type 2 diabetes mellitus without complications: Secondary | ICD-10-CM

## 2013-06-03 DIAGNOSIS — M19011 Primary osteoarthritis, right shoulder: Secondary | ICD-10-CM

## 2013-06-03 DIAGNOSIS — Z79899 Other long term (current) drug therapy: Secondary | ICD-10-CM

## 2013-06-03 DIAGNOSIS — E039 Hypothyroidism, unspecified: Secondary | ICD-10-CM | POA: Diagnosis not present

## 2013-06-03 LAB — LIPID PANEL
CHOL/HDL RATIO: 4 ratio
Cholesterol: 182 mg/dL (ref 0–200)
HDL: 46 mg/dL (ref >39)
LDL CALC: 114 mg/dL — AB (ref 0–99)
Triglycerides: 111 mg/dL (ref <150)
VLDL: 22 mg/dL (ref 0–40)

## 2013-06-03 LAB — HEPATIC FUNCTION PANEL
ALBUMIN: 4.1 g/dL (ref 3.5–5.2)
ALK PHOS: 67 U/L (ref 39–117)
ALT: 10 U/L (ref 0–35)
AST: 18 U/L (ref 0–37)
BILIRUBIN DIRECT: 0.1 mg/dL (ref 0.0–0.3)
BILIRUBIN TOTAL: 0.5 mg/dL (ref 0.2–1.2)
Indirect Bilirubin: 0.4 mg/dL (ref 0.2–1.2)
Total Protein: 8.4 g/dL — ABNORMAL HIGH (ref 6.0–8.3)

## 2013-06-03 MED ORDER — ALPRAZOLAM 0.5 MG PO TBDP: ORAL_TABLET | ORAL | Status: DC

## 2013-06-03 MED ORDER — HYDROCODONE-ACETAMINOPHEN 5-325 MG PO TABS: 1.0000 | ORAL_TABLET | Freq: Four times a day (QID) | ORAL | Status: DC | PRN

## 2013-06-03 NOTE — Patient Instructions (Addendum)
Take Xanax for vertigo. Take Norco for osteoarthritis pain. Return in 6 months. Both shoulders injected today. Please remember to take antihypertensive medications before coming to office . may take Aleve for osteoarthritis pain

## 2013-06-03 NOTE — Addendum Note (Signed)
Addended by: Brett Canales on: 06/03/2013 11:12 AM   Modules accepted: Orders

## 2013-06-03 NOTE — Progress Notes (Signed)
   Subjective:    Patient ID: [name], female    DOB: 01-22-1941, 73 y.o.   MRN: [id number]  HPI 73 year old Black Female in today for six-month recheck on hypertension, diabetes, hypothyroidism and hyperlipidemia. Says that her shoulders have been aching a lot this past winter. Takes Aleve occasionally. Has run out of Norco. Rash that she was seen for here in December has resolved. However has been having more problems recently with benign positional vertigo for the past 2 weeks. Dizzy with position change. Continues to drive but her sister drove her here today.    Review of Systems     Objective:   Physical Exam  skin warm and dry without rash. Nodes none. TMs are chronically scarred but not red. Pharynx is clear. Neck is supple without JVD thyromegaly or carotid bruits. Chest clear to auscultation. Cardiac exam regular rate and rhythm normal S1 and S2. Decreased range of motion both shoulders. Cannot extend shoulders up over her head without pain. Pain in both glenohumeral joints bilaterally with palpation. She has nystagmus present today on neurological exam. PERRLA. Moves all 4 extremities. Muscle strength is normal. Deep tendon reflexes 1+ and symmetrical.   Blood pressure is elevated today but patient did not have antihypertensive medication prior to coming to the office. Reminded her once again to be sure and take her medication even though she is fasting  Each shoulder today was injected with Depo-Medrol Marcaine and Xylocaine from posterior approach. Patient tolerated the procedure well.      Assessment & Plan:  Benign positional vertigo  Hypertension-patient reminded to take antihypertensive medication prior to coming to office.  Type 2 diabetes mellitus controlled  Hyperlipidemia  Hypothyroidism  Obesity  Metabolic syndrome  Osteoarthritis of shoulders  Plan: Both shoulders injected today as described above. Return in 6 months for physical exam. Refill Norco  5/325 to take once or twice daily for osteoarthritis pain. Xanax 0.5 mg for vertigo 1/2-1 by mouth twice a day when necessary vertigo. Continue same medications otherwise. May take Aleve daily for osteoarthritis pain.

## 2013-06-04 LAB — TSH: TSH: 3.09 u[IU]/mL (ref 0.350–4.500)

## 2013-06-06 ENCOUNTER — Other Ambulatory Visit: Payer: Self-pay

## 2013-06-06 MED ORDER — LEVOTHYROXINE SODIUM 75 MCG PO TABS: 75.0000 ug | ORAL_TABLET | Freq: Every day | ORAL | Status: DC

## 2013-06-08 ENCOUNTER — Other Ambulatory Visit: Payer: Self-pay

## 2013-06-08 MED ORDER — METOPROLOL SUCCINATE ER 25 MG PO TB24: 25.0000 mg | ORAL_TABLET | Freq: Every day | ORAL | Status: DC

## 2013-08-07 NOTE — Progress Notes (Signed)
   Subjective:    Patient ID: [name], female    DOB: 05/18/1940, 73 y.o.   MRN: [id number]  HPI Seems very anxious today. Says she's had rash and a lot of itching. Doesn't know what's causing rash. Has not taken blood pressure medication before coming to this office. She has been reminded before to do so. Sometimes I wonder she's compliant with her medication based on refills from the pharmacy.    Review of Systems     Objective:   Physical Exam Generalized fine papular rash on arms       Assessment & Plan:  Nonspecific dermatitis  Hypertension  Plan: Triamcinolone cream 0.1% 60 g in 4 ounces Eucerin to apply to rash 3 times a day sparingly. Reminded patient to take blood pressure medication before coming to office.

## 2013-08-07 NOTE — Patient Instructions (Signed)
Use triamcinolone cream in  Eucerin cream 2-3 times daily on rash.

## 2013-09-16 DIAGNOSIS — M19019 Primary osteoarthritis, unspecified shoulder: Secondary | ICD-10-CM | POA: Diagnosis not present

## 2013-09-16 DIAGNOSIS — M25519 Pain in unspecified shoulder: Secondary | ICD-10-CM | POA: Diagnosis not present

## 2013-09-21 DIAGNOSIS — M25519 Pain in unspecified shoulder: Secondary | ICD-10-CM | POA: Diagnosis not present

## 2013-09-26 DIAGNOSIS — M25519 Pain in unspecified shoulder: Secondary | ICD-10-CM | POA: Diagnosis not present

## 2013-09-28 DIAGNOSIS — M25519 Pain in unspecified shoulder: Secondary | ICD-10-CM | POA: Diagnosis not present

## 2013-10-03 DIAGNOSIS — M25519 Pain in unspecified shoulder: Secondary | ICD-10-CM | POA: Diagnosis not present

## 2013-10-05 DIAGNOSIS — M25519 Pain in unspecified shoulder: Secondary | ICD-10-CM | POA: Diagnosis not present

## 2013-10-10 DIAGNOSIS — M25519 Pain in unspecified shoulder: Secondary | ICD-10-CM | POA: Diagnosis not present

## 2013-10-12 DIAGNOSIS — M25519 Pain in unspecified shoulder: Secondary | ICD-10-CM | POA: Diagnosis not present

## 2013-10-17 DIAGNOSIS — M25519 Pain in unspecified shoulder: Secondary | ICD-10-CM | POA: Diagnosis not present

## 2013-10-20 ENCOUNTER — Other Ambulatory Visit: Payer: Self-pay

## 2013-10-20 DIAGNOSIS — M25519 Pain in unspecified shoulder: Secondary | ICD-10-CM | POA: Diagnosis not present

## 2013-10-20 MED ORDER — CLOTRIMAZOLE-BETAMETHASONE 1-0.05 % EX CREA: 1.0000 "application " | TOPICAL_CREAM | Freq: Two times a day (BID) | CUTANEOUS | Status: DC

## 2013-10-21 ENCOUNTER — Ambulatory Visit (INDEPENDENT_AMBULATORY_CARE_PROVIDER_SITE_OTHER): Payer: Medicare Other | Admitting: Internal Medicine

## 2013-10-21 VITALS — BP 142/86

## 2013-10-21 DIAGNOSIS — E785 Hyperlipidemia, unspecified: Secondary | ICD-10-CM

## 2013-10-21 DIAGNOSIS — E119 Type 2 diabetes mellitus without complications: Secondary | ICD-10-CM

## 2013-10-21 DIAGNOSIS — E039 Hypothyroidism, unspecified: Secondary | ICD-10-CM

## 2013-10-21 DIAGNOSIS — I10 Essential (primary) hypertension: Secondary | ICD-10-CM | POA: Diagnosis not present

## 2013-10-21 DIAGNOSIS — M25519 Pain in unspecified shoulder: Secondary | ICD-10-CM | POA: Diagnosis not present

## 2013-10-21 DIAGNOSIS — M19019 Primary osteoarthritis, unspecified shoulder: Secondary | ICD-10-CM | POA: Diagnosis not present

## 2013-10-21 LAB — CBC WITH DIFFERENTIAL/PLATELET
BASOS PCT: 0 % (ref 0–1)
Basophils Absolute: 0 10*3/uL (ref 0.0–0.1)
Eosinophils Absolute: 0.1 10*3/uL (ref 0.0–0.7)
Eosinophils Relative: 1 % (ref 0–5)
HCT: 36.9 % (ref 36.0–46.0)
Hemoglobin: 12.8 g/dL (ref 12.0–15.0)
LYMPHS ABS: 2.7 10*3/uL (ref 0.7–4.0)
Lymphocytes Relative: 43 % (ref 12–46)
MCH: 31.3 pg (ref 26.0–34.0)
MCHC: 34.7 g/dL (ref 30.0–36.0)
MCV: 90.2 fL (ref 78.0–100.0)
MONOS PCT: 9 % (ref 3–12)
Monocytes Absolute: 0.6 10*3/uL (ref 0.1–1.0)
NEUTROS ABS: 3 10*3/uL (ref 1.7–7.7)
NEUTROS PCT: 47 % (ref 43–77)
Platelets: 245 10*3/uL (ref 150–400)
RBC: 4.09 MIL/uL (ref 3.87–5.11)
RDW: 13.2 % (ref 11.5–15.5)
WBC: 6.3 10*3/uL (ref 4.0–10.5)

## 2013-10-21 LAB — LIPID PANEL
Cholesterol: 192 mg/dL (ref 0–200)
HDL: 51 mg/dL (ref >39)
LDL CALC: 123 mg/dL — AB (ref 0–99)
Total CHOL/HDL Ratio: 3.8 Ratio
Triglycerides: 90 mg/dL (ref <150)
VLDL: 18 mg/dL (ref 0–40)

## 2013-10-21 LAB — COMPREHENSIVE METABOLIC PANEL
ALK PHOS: 50 U/L (ref 39–117)
ALT: 15 U/L (ref 0–35)
AST: 19 U/L (ref 0–37)
Albumin: 3.8 g/dL (ref 3.5–5.2)
BUN: 25 mg/dL — ABNORMAL HIGH (ref 6–23)
CO2: 27 mEq/L (ref 19–32)
Calcium: 9.3 mg/dL (ref 8.4–10.5)
Chloride: 103 mEq/L (ref 96–112)
Creat: 1.54 mg/dL — ABNORMAL HIGH (ref 0.50–1.10)
GLUCOSE: 94 mg/dL (ref 70–99)
Potassium: 4.3 mEq/L (ref 3.5–5.3)
SODIUM: 141 meq/L (ref 135–145)
TOTAL PROTEIN: 7.2 g/dL (ref 6.0–8.3)
Total Bilirubin: 0.4 mg/dL (ref 0.2–1.2)

## 2013-10-21 LAB — HEMOGLOBIN A1C
HEMOGLOBIN A1C: 6.2 % — AB (ref <5.7)
MEAN PLASMA GLUCOSE: 131 mg/dL — AB (ref <117)

## 2013-10-22 LAB — TSH: TSH: 2.614 u[IU]/mL (ref 0.350–4.500)

## 2013-10-27 DIAGNOSIS — M25519 Pain in unspecified shoulder: Secondary | ICD-10-CM | POA: Diagnosis not present

## 2013-10-31 DIAGNOSIS — M25519 Pain in unspecified shoulder: Secondary | ICD-10-CM | POA: Diagnosis not present

## 2013-11-02 DIAGNOSIS — M25519 Pain in unspecified shoulder: Secondary | ICD-10-CM | POA: Diagnosis not present

## 2013-11-07 DIAGNOSIS — M25519 Pain in unspecified shoulder: Secondary | ICD-10-CM | POA: Diagnosis not present

## 2013-11-25 DIAGNOSIS — M7500 Adhesive capsulitis of unspecified shoulder: Secondary | ICD-10-CM | POA: Diagnosis not present

## 2013-11-25 DIAGNOSIS — M19019 Primary osteoarthritis, unspecified shoulder: Secondary | ICD-10-CM | POA: Diagnosis not present

## 2013-11-25 DIAGNOSIS — M25519 Pain in unspecified shoulder: Secondary | ICD-10-CM | POA: Diagnosis not present

## 2013-12-05 DIAGNOSIS — M25519 Pain in unspecified shoulder: Secondary | ICD-10-CM | POA: Diagnosis not present

## 2013-12-16 ENCOUNTER — Ambulatory Visit (INDEPENDENT_AMBULATORY_CARE_PROVIDER_SITE_OTHER): Payer: Medicare Other | Admitting: Internal Medicine

## 2013-12-16 ENCOUNTER — Encounter: Payer: Self-pay | Admitting: *Deleted

## 2013-12-16 ENCOUNTER — Encounter: Payer: Self-pay | Admitting: Internal Medicine

## 2013-12-16 VITALS — BP 138/80 | HR 78 | Temp 97.9°F | Wt 229.0 lb

## 2013-12-16 DIAGNOSIS — E039 Hypothyroidism, unspecified: Secondary | ICD-10-CM | POA: Diagnosis not present

## 2013-12-16 DIAGNOSIS — M19011 Primary osteoarthritis, right shoulder: Secondary | ICD-10-CM

## 2013-12-16 DIAGNOSIS — N181 Chronic kidney disease, stage 1: Secondary | ICD-10-CM

## 2013-12-16 DIAGNOSIS — E785 Hyperlipidemia, unspecified: Secondary | ICD-10-CM | POA: Diagnosis not present

## 2013-12-16 DIAGNOSIS — I10 Essential (primary) hypertension: Secondary | ICD-10-CM | POA: Diagnosis not present

## 2013-12-16 DIAGNOSIS — M545 Low back pain, unspecified: Secondary | ICD-10-CM

## 2013-12-16 DIAGNOSIS — M170 Bilateral primary osteoarthritis of knee: Secondary | ICD-10-CM | POA: Diagnosis not present

## 2013-12-16 DIAGNOSIS — Z0000 Encounter for general adult medical examination without abnormal findings: Secondary | ICD-10-CM

## 2013-12-16 DIAGNOSIS — E119 Type 2 diabetes mellitus without complications: Secondary | ICD-10-CM

## 2013-12-16 DIAGNOSIS — L304 Erythema intertrigo: Secondary | ICD-10-CM

## 2013-12-16 MED ORDER — CLOTRIMAZOLE-BETAMETHASONE 1-0.05 % EX CREA: 1.0000 "application " | TOPICAL_CREAM | Freq: Two times a day (BID) | CUTANEOUS | Status: DC

## 2013-12-16 MED ORDER — METOPROLOL SUCCINATE ER 25 MG PO TB24: 25.0000 mg | ORAL_TABLET | Freq: Every day | ORAL | Status: DC

## 2013-12-16 MED ORDER — MUPIROCIN 2 % EX OINT: 1.0000 "application " | TOPICAL_OINTMENT | Freq: Two times a day (BID) | CUTANEOUS | Status: DC

## 2013-12-16 MED ORDER — HYDROCODONE-ACETAMINOPHEN 5-325 MG PO TABS: 1.0000 | ORAL_TABLET | Freq: Four times a day (QID) | ORAL | Status: DC | PRN

## 2013-12-16 MED ORDER — AMLODIPINE BESYLATE 10 MG PO TABS: 10.0000 mg | ORAL_TABLET | Freq: Every day | ORAL | Status: DC

## 2013-12-16 MED ORDER — LEVOTHYROXINE SODIUM 75 MCG PO TABS: 75.0000 ug | ORAL_TABLET | Freq: Every day | ORAL | Status: DC

## 2013-12-16 MED ORDER — FUROSEMIDE 40 MG PO TABS: 40.0000 mg | ORAL_TABLET | Freq: Every day | ORAL | Status: DC

## 2013-12-16 MED ORDER — POTASSIUM CHLORIDE ER 10 MEQ PO TBCR: 10.0000 meq | EXTENDED_RELEASE_TABLET | Freq: Every day | ORAL | Status: DC

## 2013-12-16 MED ORDER — SIMVASTATIN 10 MG PO TABS: 10.0000 mg | ORAL_TABLET | Freq: Every evening | ORAL | Status: DC

## 2013-12-16 NOTE — Progress Notes (Signed)
Subjective:    Patient ID: [name], female    DOB: March 15, 1940, 73 y.o.   MRN: [id number]  HPI  73 year old 58 female in today for health maintenance exam in today wishing of medical issues. History of diabetes mellitus, hypertension, hyperlipidemia, dependent edema, hypothyroidism the pain and back pain. Complaining of tinnitus. Has appointment for diabetic eye exam with [name] in [place]. She declines flu vaccine. Was given 3 Hemoccult cards. Has been given handicap parking permit in [id number]. Has ambulatory issues due to osteoarthritis. Declines colonoscopy.  Prinivil causes a cough.  She is on statin medication for hyperlipidemia and diabetes is diet controlled at present time.  No history of serious illnesses accidents or operations before I assumed her care in 2009.  She had right hip replacement in 2010.  Social history: She is retired from an Museum/gallery exhibitions officer. Resides alone. Does not smoke or consume alcohol. She owns her own home and does her own housework. She is married and completed high school. Have her husband is in a nursing home here in [place] with history of diabetes status post leg amputation.  Family history: Father died at age 66 of prostate cancer. Mother died at age 22 of stroke with history of hypertension. One brother with history of lung cancer.    Review of Systems  Constitutional: Negative.   HENT:       Tinnitus  Respiratory: Negative.   Cardiovascular: Positive for leg swelling. Negative for chest pain.  Gastrointestinal: Negative.   Musculoskeletal:       Bilateral knee pain  Hematological: Negative.   Psychiatric/Behavioral: Negative.        Objective:   Physical Exam  Constitutional: She is oriented to person, place, and time. She appears well-developed and well-nourished. No distress.  HENT:  Head: Normocephalic and atraumatic.  Right Ear: External ear normal.  Left Ear: External ear normal.  Mouth/Throat: Oropharynx  is clear and moist. No oropharyngeal exudate.  Eyes: Conjunctivae and EOM are normal. Pupils are equal, round, and reactive to light. Right eye exhibits no discharge. Left eye exhibits no discharge. No scleral icterus.  Neck: Neck supple. No JVD present. No thyromegaly present.  Cardiovascular: Normal rate, regular rhythm, normal heart sounds and intact distal pulses.   No murmur heard. Pulmonary/Chest: Effort normal and breath sounds normal. No respiratory distress. She has no wheezes. She has no rales. She exhibits no tenderness.  Breasts normal female  Abdominal: Soft. Bowel sounds are normal. She exhibits no distension and no mass. There is no tenderness. There is no rebound and no guarding.  Genitourinary:  Pap taken in 2013. Bimanual normal.  Musculoskeletal: Normal range of motion. She exhibits no edema.  Neurological: She is alert and oriented to person, place, and time. She has normal reflexes. She displays normal reflexes. No cranial nerve deficit. Coordination normal.  Skin: Skin is warm and dry. No rash noted. She is not diaphoretic.  Psychiatric: She has a normal mood and affect. Her behavior is normal. Judgment and thought content normal.  Vitals reviewed.         Assessment & Plan:  Hypertension-stable  Hyperlipidemia-treated with diet  Hypothyroidism-stable on thyroid replacement  Back pain  Osteoarthritis of the knees  Tinnitus  Type 2 diabetes mellitus  Intertrigo-treat with Nizoral  Plan: Given Norco for pain. Reminded about diabetic eye exam. 3 Hemoccult cards given. Refill Bactroban. Nizoral given for intertrigo. Return in 6 months or as needed. LDL cholesterol is 123. Hemoglobin A1c was  6.2% in August.  Subjective:   Patient presents for Medicare Annual/Subsequent preventive examination.   Review Past Medical/Family/Social: See above   Risk Factors  Current exercise habits: Sedentary Dietary issues discussed: Low fat low carb  Cardiac risk  factors: Family history of stroke and hypertension in mother. Hyperlipidemia, diabetes mellitus, hypertension  Depression Screen  (Note: if answer to either of the following is "Yes", a more complete depression screening is indicated)  Over the past two weeks, have you felt down, depressed or hopeless? Yes  Over the past two weeks, have you felt little interest or pleasure in doing things? Yes  Have you lost interest or pleasure in daily life? Yes  Do you often feel hopeless? Yes  Do you cry easily over simple problems? No   Activities of Daily Living  In your present state of health, do you have any difficulty performing the following activities?:  Driving? No  Managing money? No  Feeding yourself? No  Getting from bed to chair? No  Climbing a flight of stairs? Yes due to musculoskeletal pain Preparing food and eating?: No  Bathing or showering? No  Getting dressed: No  Getting to the toilet? No  Using the toilet:No  Moving around from place to place: Sometimes because of musculoskeletal pain In the past year have you fallen or had a near fall?:No  Are you sexually active? No  Do you have more than one partner? No   Hearing Difficulties: No  Do you often ask people to speak up or repeat themselves? No  Do you experience ringing or noises in your ears? Yes  Do you have difficulty understanding soft or whispered voices? No  Do you feel that you have a problem with memory? no Do you often misplace items? No  Do you feel safe at home? Yes   Cognitive Testing  Alert? Yes Normal Appearance?Yes  Oriented to person? Yes Place? Yes  Time? Yes  Recall of three objects? Yes  Can perform simple calculations? Yes  Displays appropriate judgment?Yes  Can read the correct time from a watch face?Yes   List the Names of Other Physician/Practitioners you currently use: [name]   Indicate any recent Medical Services you may have received from other than [facility] providers in  the past year (date may be approximate).   Screening Tests / Date Colonoscopy   -declined                  Zostavax  Mammogram  Influenza Vaccine-declined  Tetanus/tdap   Objective:       General appearance: Appears stated age and mildly obese  Head: Normocephalic, without obvious abnormality, atraumatic  Eyes: conj clear, EOMi PEERLA  Ears: normal TM's and external ear canals both ears  Nose: Nares normal. Septum midline. Mucosa normal. No drainage or sinus tenderness.  Throat: lips, mucosa, and tongue normal; teeth and gums normal  Neck: no adenopathy, no carotid bruit, no JVD, supple, symmetrical, trachea midline and thyroid not enlarged, symmetric, no tenderness/mass/nodules  No CVA tenderness.  Lungs: clear to auscultation bilaterally  Breasts: normal appearance, no masses or tenderness, Heart: regular rate and rhythm, S1, S2 normal, no murmur, click, rub or gallop  Abdomen: soft, non-tender; bowel sounds normal; no masses, no organomegaly  Musculoskeletal: ROM normal in all joints, no crepitus, no deformity, Normal muscle strengthen. Back  is symmetric, no curvature. Skin: Skin color, texture, turgor normal. No rashes or lesions  Lymph nodes: Cervical, supraclavicular, and axillary nodes normal.  Neurologic: CN  2 -12 Normal, Normal symmetric reflexes. Normal coordination and gait  Psych: Alert & Oriented x 3, Mood appear stable.    Assessment:    Annual wellness medicare exam   Plan:    During the course of the visit the patient was educated and counseled about appropriate screening and preventive services including:  Screening mammography  Hemoccult cards given. Colonoscopy declined.  Diet review for nutrition referral? Yes ____ Not Indicated __x__  Patient Instructions (the written plan) was given to the patient.  Medicare Attestation  I have personally reviewed:  The patient's medical and social history  Their use of alcohol, tobacco or illicit drugs  Their  current medications and supplements  The patient's functional ability including ADLs,fall risks, home safety risks, cognitive, and hearing and visual impairment  Diet and physical activities  Evidence for depression or mood disorders  The patient's weight, height, BMI, and visual acuity have been recorded in the chart. I have made referrals, counseling, and provided education to the patient based on review of the above and I have provided the patient with a written personalized care plan for preventive services.    !

## 2013-12-16 NOTE — Patient Instructions (Signed)
Continue same meds and return in 6 months. Declines flu vaccine. Given 3 hemoccult cards. Have diabetic eye exam.

## 2013-12-26 ENCOUNTER — Telehealth: Payer: Self-pay | Admitting: Internal Medicine

## 2013-12-26 ENCOUNTER — Encounter: Payer: Self-pay | Admitting: Internal Medicine

## 2013-12-26 DIAGNOSIS — Z1211 Encounter for screening for malignant neoplasm of colon: Secondary | ICD-10-CM

## 2013-12-26 LAB — HEMOCCULT GUIAC POC 1CARD (OFFICE)
Card #2 Fecal Occult Blod, POC: POSITIVE
Card #3 Fecal Occult Blood, POC: POSITIVE
FECAL OCCULT BLD: POSITIVE

## 2013-12-26 NOTE — Telephone Encounter (Signed)
Patient has been taking multivitamin with iron and and NSAIDS. We are going to mail her 3 Hemoccult cards. She should stay off multivitamin with iron one week before doing Hemoccult cards and stay off NSAIDS 3 days before doing Hemoccult cards.

## 2013-12-26 NOTE — Telephone Encounter (Signed)
3 Hemoccult cards returned are all guaiac positive. Have left message to see if patient is taking iron supplement or NSAID which could possibly cause false positive results.

## 2014-01-23 ENCOUNTER — Other Ambulatory Visit (INDEPENDENT_AMBULATORY_CARE_PROVIDER_SITE_OTHER): Payer: Medicare Other | Admitting: Internal Medicine

## 2014-01-23 DIAGNOSIS — Z1211 Encounter for screening for malignant neoplasm of colon: Secondary | ICD-10-CM

## 2014-01-23 LAB — HEMOCCULT GUIAC POC 1CARD (OFFICE)
Card #2 Fecal Occult Blod, POC: NEGATIVE
Card #3 Fecal Occult Blood, POC: NEGATIVE
FECAL OCCULT BLD: NEGATIVE

## 2014-02-21 DIAGNOSIS — H2513 Age-related nuclear cataract, bilateral: Secondary | ICD-10-CM | POA: Diagnosis not present

## 2014-06-30 ENCOUNTER — Ambulatory Visit: Payer: Medicare Other | Admitting: Internal Medicine

## 2014-07-03 ENCOUNTER — Other Ambulatory Visit: Payer: Self-pay | Admitting: Internal Medicine

## 2014-07-03 ENCOUNTER — Encounter: Payer: Self-pay | Admitting: Internal Medicine

## 2014-07-03 ENCOUNTER — Ambulatory Visit (INDEPENDENT_AMBULATORY_CARE_PROVIDER_SITE_OTHER): Payer: Medicare Other | Admitting: Internal Medicine

## 2014-07-03 VITALS — BP 118/76 | HR 84 | Temp 97.8°F | Wt 226.0 lb

## 2014-07-03 DIAGNOSIS — Z79899 Other long term (current) drug therapy: Secondary | ICD-10-CM

## 2014-07-03 DIAGNOSIS — E039 Hypothyroidism, unspecified: Secondary | ICD-10-CM | POA: Diagnosis not present

## 2014-07-03 DIAGNOSIS — E785 Hyperlipidemia, unspecified: Secondary | ICD-10-CM | POA: Diagnosis not present

## 2014-07-03 DIAGNOSIS — E119 Type 2 diabetes mellitus without complications: Secondary | ICD-10-CM | POA: Diagnosis not present

## 2014-07-03 DIAGNOSIS — Z23 Encounter for immunization: Secondary | ICD-10-CM | POA: Diagnosis not present

## 2014-07-03 LAB — LIPID PANEL
CHOL/HDL RATIO: 3.4 ratio
Cholesterol: 173 mg/dL (ref 0–200)
HDL: 51 mg/dL (ref >=46)
LDL Cholesterol: 101 mg/dL — ABNORMAL HIGH (ref 0–99)
TRIGLYCERIDES: 104 mg/dL (ref <150)
VLDL: 21 mg/dL (ref 0–40)

## 2014-07-03 LAB — HEPATIC FUNCTION PANEL
ALBUMIN: 4 g/dL (ref 3.5–5.2)
ALT: 10 U/L (ref 0–35)
AST: 17 U/L (ref 0–37)
Alkaline Phosphatase: 59 U/L (ref 39–117)
BILIRUBIN INDIRECT: 0.4 mg/dL (ref 0.2–1.2)
BILIRUBIN TOTAL: 0.5 mg/dL (ref 0.2–1.2)
Bilirubin, Direct: 0.1 mg/dL (ref 0.0–0.3)
Total Protein: 7.8 g/dL (ref 6.0–8.3)

## 2014-07-03 LAB — TSH: TSH: 2.288 u[IU]/mL (ref 0.350–4.500)

## 2014-07-03 MED ORDER — HYDROCORTISONE 2.5 % EX CREA: TOPICAL_CREAM | Freq: Two times a day (BID) | CUTANEOUS | Status: DC

## 2014-07-03 MED ORDER — HYDROCODONE-ACETAMINOPHEN 5-325 MG PO TABS: 1.0000 | ORAL_TABLET | Freq: Four times a day (QID) | ORAL | Status: DC | PRN

## 2014-07-03 NOTE — Patient Instructions (Signed)
To see Dr. Maureen Ralphs regarding left knee pain. Handicap parking permit signed. Narcotic pain medication refill. Try 2% hydrocortisone cream to face 3 times a day for contact dermatitis. Continue other medications as prescribed. Lab work is pending. Prevnar given.

## 2014-07-03 NOTE — Progress Notes (Signed)
   Subjective:    Patient ID: [name], female    DOB: 31-Jul-1940, 74 y.o.   MRN: [id number]  HPI 74 year old 73 female in today for six-month recheck. Multiple medical issues including hypertension, chronic kidney disease, osteoarthritis, back pain, hyperlipidemia, dependent edema, controlled right 2 diabetes. History of hypokalemia. Denies noncompliance with medication. Also has hypothyroidism.  In August, she strained her left knee coming down from a step that she missed. It twisted outwards. Has been painful ever cents. Dr. Maureen Ralphs was orthopedist who did hip replacement. We will try to get an appointment with him in the near future. We talked about a steroid injection but she does not want to do that. We will give her a small quantity of pain medication for now since it's quite painful.  Her blood pressure is excellent. Denies noncompliance with medication. Urine obtained for microalbumin. Prevnar given today.  Complaining of rash on her face after working out in the yard. Looks like contact dermatitis    Review of Systems     Objective:   Physical Exam  Papular rash on face around eyes and cheeks. Eyes are not red or swollen. Neck is supple without JVD thyromegaly or carotid bruits. Chest clear. Cardiac exam regular rate and rhythm normal S1 and S2. Extremities without edema. No thyromegaly.  Pain lateral left joint line. No effusion. Osteoarthritis changes left knee.    Assessment & Plan:  Left knee pain-could have torn cartilage or partially torn ligament. To see orthopedist. Narcotic pain medication refilled. No evidence of abuse of prescription previously. Orthopedic appointment.  Type 2 diabetes mellitus-doesn't check Accu-Cheks on a regular basis. Tries to watch diet.  Hypertension-stable on current medication  Chronic kidney disease  Hypothyroidism-TSH pending  Obesity-cannot walk very much due to left knee pain. Needs to watch diet. His lost 3 pounds since last  visit.   Handicap parking permit signed.  Hyperlipidemia-lipid panel pending  Contact dermatitis-face  Plan: Return in 6 months for physical examination. Prevnar given today. Urine obtained for microalbumin. Try 2 percent Hytone cream on face for contact dermatitis. Narcotic pain medication refilled.

## 2014-07-04 LAB — HEMOGLOBIN A1C
Hgb A1c MFr Bld: 5.8 % — ABNORMAL HIGH (ref <5.7)
MEAN PLASMA GLUCOSE: 120 mg/dL — AB (ref <117)

## 2014-07-04 LAB — MICROALBUMIN / CREATININE URINE RATIO
Creatinine, Urine: 191.2 mg/dL
Microalb Creat Ratio: 4.2 mg/g (ref 0.0–30.0)
Microalb, Ur: 0.8 mg/dL (ref <2.0)

## 2014-07-05 ENCOUNTER — Telehealth: Payer: Self-pay | Admitting: Internal Medicine

## 2014-07-05 ENCOUNTER — Telehealth: Payer: Self-pay | Admitting: *Deleted

## 2014-07-05 NOTE — Telephone Encounter (Signed)
Reviewed lab results with patient.

## 2014-07-05 NOTE — Telephone Encounter (Signed)
Spoke with daughter, Gracia Saggese @ [phone] to confirm that they have heard from [place]:  Dr. Gaynelle Arabian.  Appointment scheduled for 09/07/14 @ 9:30 a.m.   Patient has been notified and daughter advised that patient will NOT see anyone else.

## 2014-09-07 DIAGNOSIS — M1712 Unilateral primary osteoarthritis, left knee: Secondary | ICD-10-CM | POA: Diagnosis not present

## 2014-09-07 DIAGNOSIS — M25562 Pain in left knee: Secondary | ICD-10-CM | POA: Diagnosis not present

## 2014-09-13 ENCOUNTER — Other Ambulatory Visit: Payer: Self-pay | Admitting: *Deleted

## 2014-09-13 MED ORDER — FUROSEMIDE 40 MG PO TABS: 40.0000 mg | ORAL_TABLET | Freq: Every day | ORAL | Status: DC

## 2014-09-13 NOTE — Telephone Encounter (Signed)
Lasix refilled.

## 2014-11-03 ENCOUNTER — Encounter: Payer: Self-pay | Admitting: Cardiovascular Disease

## 2014-12-01 ENCOUNTER — Telehealth: Payer: Self-pay | Admitting: Internal Medicine

## 2014-12-01 ENCOUNTER — Ambulatory Visit (INDEPENDENT_AMBULATORY_CARE_PROVIDER_SITE_OTHER): Payer: Medicare Other | Admitting: Internal Medicine

## 2014-12-01 ENCOUNTER — Encounter: Payer: Self-pay | Admitting: Internal Medicine

## 2014-12-01 VITALS — BP 124/84 | HR 75 | Temp 98.1°F | Ht 63.0 in | Wt 231.5 lb

## 2014-12-01 DIAGNOSIS — E559 Vitamin D deficiency, unspecified: Secondary | ICD-10-CM | POA: Diagnosis not present

## 2014-12-01 DIAGNOSIS — R21 Rash and other nonspecific skin eruption: Secondary | ICD-10-CM

## 2014-12-01 DIAGNOSIS — Z79899 Other long term (current) drug therapy: Secondary | ICD-10-CM | POA: Diagnosis not present

## 2014-12-01 DIAGNOSIS — Z23 Encounter for immunization: Secondary | ICD-10-CM | POA: Diagnosis not present

## 2014-12-01 DIAGNOSIS — E785 Hyperlipidemia, unspecified: Secondary | ICD-10-CM

## 2014-12-01 DIAGNOSIS — I10 Essential (primary) hypertension: Secondary | ICD-10-CM | POA: Diagnosis not present

## 2014-12-01 DIAGNOSIS — R5383 Other fatigue: Secondary | ICD-10-CM | POA: Diagnosis not present

## 2014-12-01 DIAGNOSIS — R609 Edema, unspecified: Secondary | ICD-10-CM | POA: Diagnosis not present

## 2014-12-01 LAB — CBC WITH DIFFERENTIAL/PLATELET
BASOS ABS: 0 10*3/uL (ref 0.0–0.1)
Basophils Relative: 0 % (ref 0–1)
EOS ABS: 0.1 10*3/uL (ref 0.0–0.7)
Eosinophils Relative: 2 % (ref 0–5)
HCT: 37.7 % (ref 36.0–46.0)
HEMOGLOBIN: 12.6 g/dL (ref 12.0–15.0)
Lymphocytes Relative: 49 % — ABNORMAL HIGH (ref 12–46)
Lymphs Abs: 2.2 10*3/uL (ref 0.7–4.0)
MCH: 31.5 pg (ref 26.0–34.0)
MCHC: 33.4 g/dL (ref 30.0–36.0)
MCV: 94.3 fL (ref 78.0–100.0)
MPV: 9.8 fL (ref 8.6–12.4)
Monocytes Absolute: 0.5 10*3/uL (ref 0.1–1.0)
Monocytes Relative: 12 % (ref 3–12)
NEUTROS ABS: 1.7 10*3/uL (ref 1.7–7.7)
NEUTROS PCT: 37 % — AB (ref 43–77)
PLATELETS: 245 10*3/uL (ref 150–400)
RBC: 4 MIL/uL (ref 3.87–5.11)
RDW: 13.4 % (ref 11.5–15.5)
WBC: 4.5 10*3/uL (ref 4.0–10.5)

## 2014-12-01 LAB — COMPLETE METABOLIC PANEL WITH GFR
ALBUMIN: 3.8 g/dL (ref 3.6–5.1)
ALT: 10 U/L (ref 6–29)
AST: 19 U/L (ref 10–35)
Alkaline Phosphatase: 67 U/L (ref 33–130)
BILIRUBIN TOTAL: 0.5 mg/dL (ref 0.2–1.2)
BUN: 17 mg/dL (ref 7–25)
CO2: 28 mmol/L (ref 20–31)
CREATININE: 1.19 mg/dL — AB (ref 0.60–0.93)
Calcium: 9.1 mg/dL (ref 8.6–10.4)
Chloride: 103 mmol/L (ref 98–110)
GFR, EST AFRICAN AMERICAN: 52 mL/min — AB (ref >=60)
GFR, Est Non African American: 45 mL/min — ABNORMAL LOW (ref >=60)
Glucose, Bld: 82 mg/dL (ref 65–99)
Potassium: 4.1 mmol/L (ref 3.5–5.3)
Sodium: 138 mmol/L (ref 135–146)
TOTAL PROTEIN: 7.4 g/dL (ref 6.1–8.1)

## 2014-12-01 LAB — LIPID PANEL
CHOLESTEROL: 193 mg/dL (ref 125–200)
HDL: 56 mg/dL (ref >=46)
LDL Cholesterol: 116 mg/dL (ref <130)
Total CHOL/HDL Ratio: 3.4 Ratio (ref <=5.0)
Triglycerides: 103 mg/dL (ref <150)
VLDL: 21 mg/dL (ref <30)

## 2014-12-01 LAB — TSH: TSH: 2.177 u[IU]/mL (ref 0.350–4.500)

## 2014-12-01 MED ORDER — HYDROCODONE-ACETAMINOPHEN 5-325 MG PO TABS: 1.0000 | ORAL_TABLET | Freq: Four times a day (QID) | ORAL | Status: DC | PRN

## 2014-12-01 NOTE — Progress Notes (Signed)
   Subjective:    Patient ID: [name], female    DOB: May 28, 1940, 74 y.o.   MRN: [id number]  HPI  74 year old Black Female with history of hypertension currently on metoprolol, calcium channel blocker, Lasix with persistent facial rash. Says it stings when she gets out in the sun. Has not been on HCTZ. Is worried that rash is not improving. Her daughter is a Marine scientist at [employer]  Has appointment October for complete physical exam. Went ahead and drew fasting labs today along with ANA  Review of Systems     Objective:   Physical Exam  Hyperpigmentation in a butterfly type of pattern with some papules.? 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 No other rash. Chest clear. Cardiac exam regular rate and rhythm. Extremity is without edema.      Assessment & Plan:  Facial rash-ANA drawn. Dermatology appointment made.? Contact dermatitis versus discoid lupus versus drug eruption  Essential hypertension-stable on current regimen  Hyperlipidemia-on statin medication  Chronic pain-refill hydrocodone/APAP for osteoarthritis #180 see written prescription  Plan: To see dermatologist regarding persistent facial rash for several months. Labs drawn and pending including ANA. Flu vaccine given today. Physical examination to follow in October. Fax records to Dermatologist.

## 2014-12-01 NOTE — Patient Instructions (Signed)
Fasting labs drawn for upcoming physical examination. Blood pressure is stable. Not on thiazide diuretic. See dermatologist regarding rash. ANA drawn

## 2014-12-01 NOTE — Telephone Encounter (Signed)
Appointment made with Dermatologist:  Dr. Rinaldo Ratel @ [phone].  Information faxed to [phone].   Date:  12/06/14 @11 :57.  RE:  Discoid Lupus Labs drawn today for CPE Labs and ANA Patient made aware of appointment as well as daughter, Jayme Cloud.  Daughter will call and R/S if this is not a good day.  Patient given appointment card with name, date/time and address/phone #.

## 2014-12-02 LAB — ANA: Anti Nuclear Antibody(ANA): NEGATIVE

## 2014-12-02 LAB — VITAMIN D 25 HYDROXY (VIT D DEFICIENCY, FRACTURES): Vit D, 25-Hydroxy: 36 ng/mL (ref 30–100)

## 2014-12-18 ENCOUNTER — Encounter: Payer: Medicare Other | Admitting: Internal Medicine

## 2014-12-19 ENCOUNTER — Ambulatory Visit (INDEPENDENT_AMBULATORY_CARE_PROVIDER_SITE_OTHER): Payer: Medicare Other | Admitting: Internal Medicine

## 2014-12-19 ENCOUNTER — Encounter: Payer: Self-pay | Admitting: Internal Medicine

## 2014-12-19 VITALS — BP 118/74 | HR 67 | Temp 97.5°F | Ht 63.0 in | Wt 234.0 lb

## 2014-12-19 DIAGNOSIS — M19011 Primary osteoarthritis, right shoulder: Secondary | ICD-10-CM | POA: Diagnosis not present

## 2014-12-19 DIAGNOSIS — I10 Essential (primary) hypertension: Secondary | ICD-10-CM

## 2014-12-19 DIAGNOSIS — Z0000 Encounter for general adult medical examination without abnormal findings: Secondary | ICD-10-CM | POA: Diagnosis not present

## 2014-12-19 DIAGNOSIS — E039 Hypothyroidism, unspecified: Secondary | ICD-10-CM

## 2014-12-19 DIAGNOSIS — E8881 Metabolic syndrome: Secondary | ICD-10-CM

## 2014-12-19 DIAGNOSIS — R21 Rash and other nonspecific skin eruption: Secondary | ICD-10-CM

## 2014-12-19 DIAGNOSIS — M170 Bilateral primary osteoarthritis of knee: Secondary | ICD-10-CM | POA: Diagnosis not present

## 2014-12-19 DIAGNOSIS — E785 Hyperlipidemia, unspecified: Secondary | ICD-10-CM | POA: Diagnosis not present

## 2014-12-19 DIAGNOSIS — L304 Erythema intertrigo: Secondary | ICD-10-CM | POA: Diagnosis not present

## 2014-12-19 DIAGNOSIS — J069 Acute upper respiratory infection, unspecified: Secondary | ICD-10-CM

## 2014-12-19 DIAGNOSIS — E119 Type 2 diabetes mellitus without complications: Secondary | ICD-10-CM | POA: Diagnosis not present

## 2014-12-19 LAB — POCT URINALYSIS DIPSTICK
BILIRUBIN UA: NEGATIVE
Glucose, UA: NEGATIVE
Ketones, UA: NEGATIVE
Leukocytes, UA: NEGATIVE
NITRITE UA: NEGATIVE
PH UA: 6
Protein, UA: NEGATIVE
RBC UA: NEGATIVE
SPEC GRAV UA: 1.02
Urobilinogen, UA: NEGATIVE

## 2014-12-20 ENCOUNTER — Other Ambulatory Visit: Payer: Self-pay | Admitting: *Deleted

## 2014-12-20 MED ORDER — SIMVASTATIN 10 MG PO TABS: 10.0000 mg | ORAL_TABLET | Freq: Every evening | ORAL | Status: DC

## 2015-01-06 NOTE — Progress Notes (Signed)
Subjective:    Patient ID: [name], female    DOB: 1940-05-20, 74 y.o.   MRN: [id number]  HPI 74 year old Black Female in today for health maintenance exam and evaluation of medical issues. Upcoming appointment to see dermatologist regarding rash on face. I am concerned about discoid lupus.  Patient has URI today. History of hypertension hyperlipidemia, hypothyroidism, control Type 2 diabetes mellitus. History of intertrigo. Has primary osteoarthritis of both knees and osteoarthritis of right shoulder.   Prinivil causes a cough.  She is on statin medication for hyperlipidemia and diabetes is diet controlled at the present time.  No history of serious illnesses accidents or operations before I send her care in 2009.  She had right hip replacement in 2010.  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 Resides alone. Does not smoke or consume alcohol. She owns her own home and does her own housework. She is married and completed high school. Her husband is in a nursing home here in [place] with history of diabetes status post leg amputation.  Family history: Father died at  Age 9 of prostate cancer. Mother died at age 40 of stroke with history of hypertension. One brother with history of lung cancer.      Review of Systems bilateral knee pain and some back pain. Rash on face which is been present for several weeks.     Objective:   Physical Exam  Constitutional: She is oriented to person, place, and time. She appears well-developed and well-nourished. No distress.  HENT:  Head: Normocephalic and atraumatic.  Right Ear: External ear normal.  Left Ear: External ear normal.  Mouth/Throat: Oropharynx is clear and moist.  Eyes: Conjunctivae and EOM are normal. Pupils are equal, round, and reactive to light.  Neck: Neck supple. No JVD present. No thyromegaly present.  Cardiovascular: Normal rate, regular rhythm  and normal heart sounds.   No murmur heard. Pulmonary/Chest: Effort normal and breath sounds normal. No respiratory distress. She has no wheezes. She has no rales. She exhibits no tenderness.  Abdominal: Soft. Bowel sounds are normal. She exhibits no distension and no mass. There is no tenderness. There is no rebound and no guarding.  Genitourinary:  Last Pap done in 2013. Will not be repeated due to age. Bimanual normal.  Musculoskeletal: She exhibits no edema.  Lymphadenopathy:    She has no cervical adenopathy.  Neurological: She is alert and oriented to person, place, and time. She has normal reflexes. No cranial nerve deficit.  Skin: Skin is warm and dry. No rash noted. She is not diaphoretic.  Psychiatric: She has a normal mood and affect. Her behavior is normal. Judgment and thought content normal.  Vitals reviewed.         Assessment & Plan:  Acute URI-treat with Zithromax Z-PAK  Essential hypertension-stable  Hyperlipidemia  Hypothyroidism  Back pain  Osteoarthritis of the knees  Status post right hip replacement 2010  Intertrigo treated with Nizoral  Plan: Okay to refill Norco as needed for musculoskeletal pain. Reminded about annual diabetic eye exam.  Subjective:   Patient presents for Medicare Annual/Subsequent preventive examination.  Review Past Medical/Family/Social: See above   Risk Factors  Current exercise habits: Sedentary except for housework and yardwork Dietary issues discussed: Low fat low carbohydrate  Cardiac risk factors: Hypertension, diabetes mellitus, hyperlipidemia, family history  Depression Screen  (Note: if answer to either of the following is "Yes", a more complete  depression screening is indicated)   Over the past two weeks, have you felt down, depressed or hopeless? No  Over the past two weeks, have you felt little interest or pleasure in doing things? No Have you lost interest or pleasure in daily life? No Do you often feel  hopeless? No Do you cry easily over simple problems? No   Activities of Daily Living  In your present state of health, do you have any difficulty performing the following activities?:   Driving? No  Managing money? No  Feeding yourself? No  Getting from bed to chair? No  Climbing a flight of stairs? Yes due to knee pain Preparing food and eating?: No  Bathing or showering? No  Getting dressed: No  Getting to the toilet? No  Using the toilet:No  Moving around from place to place: Yes due to osteoarthritis of knees. Needs knee replacement In the past year have you fallen or had a near fall?:No  Are you sexually active? No  Do you have more than one partner? No   Hearing Difficulties: No  Do you often ask people to speak up or repeat themselves? No  Do you experience ringing or noises in your ears? Sometimes Do you have difficulty understanding soft or whispered voices? No  Do you feel that you have a problem with memory? No Do you often misplace items? Not often   Home Safety:  Do you have a smoke alarm at your residence? Yes Do you have grab bars in the bathroom? No Do you have throw rugs in your house? Yes   Cognitive Testing  Alert? Yes Normal Appearance?Yes  Oriented to person? Yes Place? Yes  Time? Yes  Recall of three objects? Yes  Can perform simple calculations? Yes  Displays appropriate judgment?Yes  Can read the correct time from a watch face?Yes   List the Names of Other Physician/Practitioners you currently use:  See referral list for the physicians patient is currently seeing.     Review of Systems: See above   Objective:     General appearance: Appears stated age and mildly obese  Head: Normocephalic, without obvious abnormality, atraumatic  Eyes: conj clear, EOMi PEERLA  Ears: normal TM's and external ear canals both ears  Nose: Nares normal. Septum midline. Mucosa normal. No drainage or sinus tenderness.  Throat: lips, mucosa, and tongue  normal; teeth and gums normal  Neck: no adenopathy, no carotid bruit, no JVD, supple, symmetrical, trachea midline and thyroid not enlarged, symmetric, no tenderness/mass/nodules  No CVA tenderness.  Lungs: clear to auscultation bilaterally  Breasts: normal appearance, no masses or tenderness Heart: regular rate and rhythm, S1, S2 normal, no murmur, click, rub or gallop  Abdomen: soft, non-tender; bowel sounds normal; no masses, no organomegaly  Musculoskeletal: ROM normal in all joints, no crepitus, no deformity, Normal muscle strengthen. Back  is symmetric, no curvature. Skin: Skin color, texture, turgor normal. No rashes or lesions  Lymph nodes: Cervical, supraclavicular, and axillary nodes normal.  Neurologic: CN 2 -12 Normal, Normal symmetric reflexes. Normal coordination and gait  Psych: Alert & Oriented x 3, Mood appear stable.    Assessment:    Annual wellness medicare exam   Plan:    During the course of the visit the patient was educated and counseled about appropriate screening and preventive services including:   Annual mammogram  Annual flu vaccine     Patient Instructions (the written plan) was given to the patient.  Medicare Attestation  I have personally  reviewed:  The patient's medical and social history  Their use of alcohol, tobacco or illicit drugs  Their current medications and supplements  The patient's functional ability including ADLs,fall risks, home safety risks, cognitive, and hearing and visual impairment  Diet and physical activities  Evidence for depression or mood disorders  The patient's weight, height, BMI, and visual acuity have been recorded in the chart. I have made referrals, counseling, and provided education to the patient based on review of the above and I have provided the patient with a written personalized care plan for preventive services.

## 2015-01-07 ENCOUNTER — Encounter: Payer: Self-pay | Admitting: Internal Medicine

## 2015-01-07 NOTE — Patient Instructions (Signed)
It was pleasure to see you today. Takes Zithromax Z-PAK as directed. Return in 6 months for follow-up. Continue same medications and keep up the good work keeping her medical issues under control.

## 2015-01-10 ENCOUNTER — Telehealth: Payer: Self-pay | Admitting: Internal Medicine

## 2015-01-10 ENCOUNTER — Other Ambulatory Visit (INDEPENDENT_AMBULATORY_CARE_PROVIDER_SITE_OTHER): Payer: Medicare Other | Admitting: Internal Medicine

## 2015-01-10 ENCOUNTER — Telehealth: Payer: Self-pay

## 2015-01-10 DIAGNOSIS — Z1211 Encounter for screening for malignant neoplasm of colon: Secondary | ICD-10-CM

## 2015-01-10 LAB — POC HEMOCCULT BLD/STL (OFFICE/1-CARD/DIAGNOSTIC)
FECAL OCCULT BLD: POSITIVE — AB
Fecal Occult Blood, POC: POSITIVE — AB
Fecal Occult Blood, POC: POSITIVE — AB

## 2015-01-10 NOTE — Telephone Encounter (Signed)
Last year, she returned 3 Hemoccult cards and had been taking vitamins with iron and NSAIDS which I think caused false positivity. This year the same thing has happened.  She returns stool cards today and they're all guaic positive. Refer to GI. Please put in referral order for Dr. Scarlette Shorts. Please call her daughter [name] and explain results.

## 2015-01-10 NOTE — Telephone Encounter (Signed)
Contacted daughter to discuss recent results of hemoccult cards,no answer left voicemail for return call. All 3 positive and referral has been entered into [facility] for Dr. Scarlette Shorts. Dr. Renold Genta feels that a colonoscopy at this point is important for her health.

## 2015-01-11 NOTE — Telephone Encounter (Signed)
Spoke to patients daughter Freda Munro and she states that her mother is adamant that she doesn't want to do it. She states that she is going to "work on it" with her to try to get her to do it.

## 2015-02-14 ENCOUNTER — Other Ambulatory Visit: Payer: Self-pay

## 2015-02-14 MED ORDER — POTASSIUM CHLORIDE ER 10 MEQ PO TBCR: 10.0000 meq | EXTENDED_RELEASE_TABLET | Freq: Every day | ORAL | Status: DC

## 2015-02-14 MED ORDER — LEVOTHYROXINE SODIUM 75 MCG PO TABS: 75.0000 ug | ORAL_TABLET | Freq: Every day | ORAL | Status: DC

## 2015-02-14 MED ORDER — AMLODIPINE BESYLATE 10 MG PO TABS: 10.0000 mg | ORAL_TABLET | Freq: Every day | ORAL | Status: DC

## 2015-02-14 NOTE — Telephone Encounter (Signed)
Last OV 12/19/14  Next OV 06/18/15

## 2015-04-30 ENCOUNTER — Telehealth: Payer: Self-pay | Admitting: Internal Medicine

## 2015-04-30 NOTE — Telephone Encounter (Signed)
Daughter, Adela Lank calling to advise that her Mom is going to be having knee replacement surgery on 5/8.  She see's you on 06/18/14 for 6 month f/u.  Her surgeon is going to need a clearance letter from you.  Shelia just wanted to make [place] aware.  Patient seen you for her CPE on 12/19/14, so she wouldn't be due for CPE until 12/2015.  Would you do the clearance letter during her 6 month f/u on 4/10?

## 2015-04-30 NOTE — Telephone Encounter (Signed)
6 month recheck will be a surgical clearance OV with brief PE  And EKG just like others have been

## 2015-06-11 ENCOUNTER — Other Ambulatory Visit: Payer: Self-pay

## 2015-06-11 DIAGNOSIS — Z1231 Encounter for screening mammogram for malignant neoplasm of breast: Secondary | ICD-10-CM

## 2015-06-12 ENCOUNTER — Other Ambulatory Visit: Payer: Self-pay

## 2015-06-12 MED ORDER — AMLODIPINE BESYLATE 10 MG PO TABS: 10.0000 mg | ORAL_TABLET | Freq: Every day | ORAL | Status: DC

## 2015-06-12 MED ORDER — FUROSEMIDE 40 MG PO TABS: 40.0000 mg | ORAL_TABLET | Freq: Every day | ORAL | Status: DC

## 2015-06-18 ENCOUNTER — Encounter: Payer: Self-pay | Admitting: Internal Medicine

## 2015-06-18 ENCOUNTER — Ambulatory Visit (INDEPENDENT_AMBULATORY_CARE_PROVIDER_SITE_OTHER): Payer: Medicare Other | Admitting: Internal Medicine

## 2015-06-18 VITALS — BP 128/80 | HR 65 | Temp 97.2°F | Resp 20 | Ht 63.0 in | Wt 233.5 lb

## 2015-06-18 DIAGNOSIS — M1711 Unilateral primary osteoarthritis, right knee: Secondary | ICD-10-CM | POA: Diagnosis not present

## 2015-06-18 DIAGNOSIS — E039 Hypothyroidism, unspecified: Secondary | ICD-10-CM

## 2015-06-18 DIAGNOSIS — M170 Bilateral primary osteoarthritis of knee: Secondary | ICD-10-CM | POA: Diagnosis not present

## 2015-06-18 DIAGNOSIS — R609 Edema, unspecified: Secondary | ICD-10-CM | POA: Diagnosis not present

## 2015-06-18 DIAGNOSIS — N183 Chronic kidney disease, stage 3 unspecified: Secondary | ICD-10-CM

## 2015-06-18 DIAGNOSIS — Z79899 Other long term (current) drug therapy: Secondary | ICD-10-CM | POA: Diagnosis not present

## 2015-06-18 DIAGNOSIS — E119 Type 2 diabetes mellitus without complications: Secondary | ICD-10-CM | POA: Diagnosis not present

## 2015-06-18 DIAGNOSIS — N189 Chronic kidney disease, unspecified: Secondary | ICD-10-CM | POA: Insufficient documentation

## 2015-06-18 DIAGNOSIS — Z419 Encounter for procedure for purposes other than remedying health state, unspecified: Secondary | ICD-10-CM | POA: Diagnosis not present

## 2015-06-18 DIAGNOSIS — Z01818 Encounter for other preprocedural examination: Secondary | ICD-10-CM

## 2015-06-18 DIAGNOSIS — E785 Hyperlipidemia, unspecified: Secondary | ICD-10-CM | POA: Diagnosis not present

## 2015-06-18 DIAGNOSIS — E8881 Metabolic syndrome: Secondary | ICD-10-CM | POA: Diagnosis not present

## 2015-06-18 DIAGNOSIS — E669 Obesity, unspecified: Secondary | ICD-10-CM | POA: Diagnosis not present

## 2015-06-18 DIAGNOSIS — M1712 Unilateral primary osteoarthritis, left knee: Secondary | ICD-10-CM | POA: Diagnosis not present

## 2015-06-18 DIAGNOSIS — I10 Essential (primary) hypertension: Secondary | ICD-10-CM | POA: Diagnosis not present

## 2015-06-18 LAB — LIPID PANEL
Cholesterol: 187 mg/dL (ref 125–200)
HDL: 47 mg/dL (ref >=46)
LDL CALC: 110 mg/dL (ref <130)
TRIGLYCERIDES: 152 mg/dL — AB (ref <150)
Total CHOL/HDL Ratio: 4 Ratio (ref <=5.0)
VLDL: 30 mg/dL (ref <30)

## 2015-06-18 LAB — COMPLETE METABOLIC PANEL WITH GFR
ALT: 10 U/L (ref 6–29)
AST: 17 U/L (ref 10–35)
Albumin: 4 g/dL (ref 3.6–5.1)
Alkaline Phosphatase: 63 U/L (ref 33–130)
BUN: 14 mg/dL (ref 7–25)
CHLORIDE: 103 mmol/L (ref 98–110)
CO2: 26 mmol/L (ref 20–31)
Calcium: 9.5 mg/dL (ref 8.6–10.4)
Creat: 1.05 mg/dL — ABNORMAL HIGH (ref 0.60–0.93)
GFR, Est African American: 60 mL/min (ref >=60)
GFR, Est Non African American: 52 mL/min — ABNORMAL LOW (ref >=60)
GLUCOSE: 93 mg/dL (ref 65–99)
POTASSIUM: 4.2 mmol/L (ref 3.5–5.3)
SODIUM: 140 mmol/L (ref 135–146)
Total Bilirubin: 0.5 mg/dL (ref 0.2–1.2)
Total Protein: 7.8 g/dL (ref 6.1–8.1)

## 2015-06-18 LAB — HEMOGLOBIN A1C
HEMOGLOBIN A1C: 6 % — AB (ref <5.7)
Mean Plasma Glucose: 126 mg/dL

## 2015-06-18 LAB — TSH: TSH: 2.04 m[IU]/L

## 2015-06-18 MED ORDER — HYDROCODONE-ACETAMINOPHEN 5-325 MG PO TABS: 1.0000 | ORAL_TABLET | Freq: Four times a day (QID) | ORAL | Status: DC | PRN

## 2015-06-18 NOTE — Patient Instructions (Addendum)
Approved for surgical clearance for knee replacement in May pending lab review.  Return in 6 months for physical exam. Reminded about annual diabetic eye exam.

## 2015-06-18 NOTE — Progress Notes (Signed)
   Subjective:    Patient ID: [name], female    DOB: 05/19/1940, 75 y.o.   MRN: [id number]  HPI Here for 6 month recheck and will need surgical clearance for knee replacement by Dr. Maureen Ralphs May 8th. Has end stage osteoarthritis both knees. Pt is  not sure which knee will be done first.  Says she feels well. She says right knee gave way with her recently.  She has a history of essential hypertension, controlled Type 2 diabetes mellitus, dependent edema, hyperlipidemia, obesity, metabolic syndrome, hypothyroidism and chronic kidney disease stage III. Last creatinine checked Fall  of 2016 was 1.19.  In 2014 she complained of chest pain and dizziness and had negative myocardial perfusion scan.  Labs drawn today and are pending.  Prinivil causes a cough.  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 She resides alone in [place]. Daughter lives here in [place]. Daughter is a Marine scientist at [employer]. Patient's husband is in a nursing home here in [place].  Family history: Father died at age 35 of prostate cancer. Mother died at age 13 of a stroke with history of hypertension. One brother with history of lung cancer.    Review of Systems  Constitutional: Negative.   Respiratory: Negative.   Cardiovascular: Positive for leg swelling.  Genitourinary: Negative.   Neurological: Negative.   Psychiatric/Behavioral: Negative.        Objective:   Physical Exam  Constitutional: She is oriented to person, place, and time. She appears well-developed and well-nourished. No distress.  HENT:  Head: Normocephalic and atraumatic.  Neck: No JVD present. No thyromegaly present.  Cardiovascular: Normal rate, regular rhythm, normal heart sounds and intact distal pulses.   No murmur heard. Pulmonary/Chest: Effort normal and breath sounds normal. No  respiratory distress. She has no wheezes.  Musculoskeletal:  1+ nonpitting edema  Lymphadenopathy:    She has no cervical adenopathy.  Neurological: She is alert and oriented to person, place, and time. She has normal reflexes. No cranial nerve deficit.  Skin: Skin is warm and dry. She is not diaphoretic.  Psychiatric: She has a normal mood and affect. Her behavior is normal. Judgment and thought content normal.  Vitals reviewed.         Assessment & Plan:  Preoperative evaluation for knee arthroplasty  Essential hypertension-stable  Controlled type 2 diabetes mellitus-hemoglobin A1c pending  Hyperlipidemia-fasting lipid panel obtained  Hypothyroidism-TSH pending  Chronic kidney disease stage III likely due to combination of essential hypertension and type 2 diabetes mellitus  Dependent edema  Obesity  Metabolic syndrome  End-stage osteoarthritis of knee  Plan: Review lab results when available. EKG performed today. Continue same medications and return for physical exam in 6 months. Approved for upcoming knee replacement surgery. Patient is in a lot of pain. Refilled hydrocodone/APAP.

## 2015-06-19 DIAGNOSIS — H40033 Anatomical narrow angle, bilateral: Secondary | ICD-10-CM | POA: Diagnosis not present

## 2015-06-19 DIAGNOSIS — H2513 Age-related nuclear cataract, bilateral: Secondary | ICD-10-CM | POA: Diagnosis not present

## 2015-06-19 LAB — CBC WITH DIFFERENTIAL/PLATELET
Basophils Absolute: 0 cells/uL (ref 0–200)
Basophils Relative: 0 %
EOS PCT: 2 %
Eosinophils Absolute: 90 cells/uL (ref 15–500)
HCT: 38.7 % (ref 35.0–45.0)
Hemoglobin: 12.7 g/dL (ref 11.7–15.5)
LYMPHS PCT: 48 %
Lymphs Abs: 2160 cells/uL (ref 850–3900)
MCH: 31.2 pg (ref 27.0–33.0)
MCHC: 32.8 g/dL (ref 32.0–36.0)
MCV: 95.1 fL (ref 80.0–100.0)
MPV: 10 fL (ref 7.5–12.5)
Monocytes Absolute: 450 cells/uL (ref 200–950)
Monocytes Relative: 10 %
NEUTROS PCT: 40 %
Neutro Abs: 1800 cells/uL (ref 1500–7800)
PLATELETS: 241 10*3/uL (ref 140–400)
RBC: 4.07 MIL/uL (ref 3.80–5.10)
RDW: 13.9 % (ref 11.0–15.0)
WBC: 4.5 10*3/uL (ref 3.8–10.8)

## 2015-06-28 DIAGNOSIS — H35361 Drusen (degenerative) of macula, right eye: Secondary | ICD-10-CM | POA: Diagnosis not present

## 2015-06-28 DIAGNOSIS — H40033 Anatomical narrow angle, bilateral: Secondary | ICD-10-CM | POA: Diagnosis not present

## 2015-07-05 ENCOUNTER — Ambulatory Visit: Payer: Self-pay | Admitting: Orthopedic Surgery

## 2015-07-05 NOTE — Progress Notes (Signed)
Preoperative surgical orders have been place into the Epic hospital system for LUJAIN OBEID on 07/05/2015, 8:54 AM  by Mickel Crow for surgery on 07-16-2015.  Preop Total Knee orders including Experal, IV Tylenol, and IV Decadron as long as there are no contraindications to the above medications. Arlee Muslim, PA-C

## 2015-07-06 ENCOUNTER — Encounter (HOSPITAL_COMMUNITY): Payer: Medicare Other

## 2015-07-06 NOTE — Patient Instructions (Addendum)
[name]  07/06/2015   Your procedure is scheduled on: 07-16-15  Report to [facility] Main  Entrance take [facility]  elevators to 3rd floor to  [place] at 0930 AM.  Call this number if you have problems the morning of surgery [phone]   Remember: ONLY 1 PERSON MAY GO WITH YOU TO SHORT STAY TO GET  READY MORNING OF [place].  Do not eat food or drink liquids :After Midnight.     Take these medicines the morning of surgery with A SIP OF WATER: Amlodipine. Levothyroxine.Metoprolol. Pain med-if need. DO NOT TAKE ANY DIABETIC MEDICATIONS DAY OF YOUR SURGERY                               You may not have any metal on your body including hair pins and              piercings  Do not wear jewelry, make-up, lotions, powders or perfumes, deodorant             Do not wear nail polish.  Do not shave  48 hours prior to surgery.              Men may shave face and neck.   Do not bring valuables to the hospital. [name].  Contacts, dentures or bridgework may not be worn into surgery.  Leave suitcase in the car. After surgery it may be brought to your room.     Patients discharged the day of surgery will not be allowed to drive home.  Name and phone number of your driver: [name]- daughter [phone]  Special Instructions: N/A              Please read over the following fact sheets you were given: _____________________________________________________________________             [facility] - Preparing for Surgery Before surgery, you can play an important role.  Because skin is not sterile, your skin needs to be as free of germs as possible.  You can reduce the number of germs on your skin by washing with CHG (chlorahexidine gluconate) soap before surgery.  CHG is an antiseptic cleaner which kills germs and bonds with the skin to continue killing germs even after washing. Please DO NOT use if you  have an allergy to CHG or antibacterial soaps.  If your skin becomes reddened/irritated stop using the CHG and inform your nurse when you arrive at Short Stay. Do not shave (including legs and underarms) for at least 48 hours prior to the first CHG shower.  You may shave your face/neck. Please follow these instructions carefully:  1.  Shower with CHG Soap the night before surgery and the  morning of Surgery.  2.  If you choose to wash your hair, wash your hair first as usual with your  normal  shampoo.  3.  After you shampoo, rinse your hair and body thoroughly to remove the  shampoo.                           4.  Use CHG as you would any other liquid soap.  You can apply chg directly  to the skin and wash                       Gently with a scrungie or clean washcloth.  5.  Apply the CHG Soap to your body ONLY FROM THE NECK DOWN.   Do not use on face/ open                           Wound or open sores. Avoid contact with eyes, ears mouth and genitals (private parts).                       Wash face,  Genitals (private parts) with your normal soap.             6.  Wash thoroughly, paying special attention to the area where your surgery  will be performed.  7.  Thoroughly rinse your body with warm water from the neck down.  8.  DO NOT shower/wash with your normal soap after using and rinsing off  the CHG Soap.                9.  Pat yourself dry with a clean towel.            10.  Wear clean pajamas.            11.  Place clean sheets on your bed the night of your first shower and do not  sleep with pets. Day of Surgery : Do not apply any lotions/deodorants the morning of surgery.  Please wear clean clothes to the hospital/surgery center.  FAILURE TO FOLLOW THESE INSTRUCTIONS MAY RESULT IN THE CANCELLATION OF YOUR SURGERY PATIENT SIGNATURE_________________________________  NURSE  SIGNATURE__________________________________  ________________________________________________________________________   [name]  An incentive spirometer is a tool that can help keep your lungs clear and active. This tool measures how well you are filling your lungs with each breath. Taking long deep breaths may help reverse or decrease the chance of developing breathing (pulmonary) problems (especially infection) following:  A long period of time when you are unable to move or be active. BEFORE THE PROCEDURE   If the spirometer includes an indicator to show your best effort, your nurse or respiratory therapist will set it to a desired goal.  If possible, sit up straight or lean slightly forward. Try not to slouch.  Hold the incentive spirometer in an upright position. INSTRUCTIONS FOR USE   Sit on the edge of your bed if possible, or sit up as far as you can in bed or on a chair.  Hold the incentive spirometer in an upright position.  Breathe out normally.  Place the mouthpiece in your mouth and seal your lips tightly around it.  Breathe in slowly and as deeply as possible, raising the piston or the ball toward the top of the column.  Hold your breath for 3-5 seconds or for as long as possible. Allow the piston or ball to fall to the bottom of the column.  Remove the mouthpiece from your mouth and breathe out normally.  Rest for a few seconds and repeat Steps 1 through 7 at least 10 times every 1-2 hours when you are awake. Take your time and take a few normal breaths between deep breaths.  The spirometer may include an indicator to show your best effort. Use the indicator as a goal to work toward during each repetition.  After each  set of 10 deep breaths, practice coughing to be sure your lungs are clear. If you have an incision (the cut made at the time of surgery), support your incision when coughing by placing a pillow or rolled up towels firmly against it. Once  you are able to get out of bed, walk around indoors and cough well. You may stop using the incentive spirometer when instructed by your caregiver.  RISKS AND COMPLICATIONS  Take your time so you do not get dizzy or light-headed.  If you are in pain, you may need to take or ask for pain medication before doing incentive spirometry. It is harder to take a deep breath if you are having pain. AFTER USE  Rest and breathe slowly and easily.  It can be helpful to keep track of a log of your progress. Your caregiver can provide you with a simple table to help with this. If you are using the spirometer at home, follow these instructions: [place] IF:   You are having difficultly using the spirometer.  You have trouble using the spirometer as often as instructed.  Your pain medication is not giving enough relief while using the spirometer.  You develop fever of 100.5 F (38.1 C) or higher. SEEK IMMEDIATE MEDICAL CARE IF:   You cough up bloody sputum that had not been present before.  You develop fever of 102 F (38.9 C) or greater.  You develop worsening pain at or near the incision site. MAKE SURE YOU:   Understand these instructions.  Will watch your condition.  Will get help right away if you are not doing well or get worse. Document Released: 07/07/2006 Document Revised: 05/19/2011 Document Reviewed: 09/07/2006 ExitCare Patient Information 2014 ExitCare, [place].   ________________________________________________________________________  WHAT IS A BLOOD TRANSFUSION? Blood Transfusion Information  A transfusion is the replacement of blood or some of its parts. Blood is made up of multiple cells which provide different functions.  Red blood cells carry oxygen and are used for blood loss replacement.  White blood cells fight against infection.  Platelets control bleeding.  Plasma helps clot blood.  Other blood products are available for specialized needs, such as  hemophilia or other clotting disorders. BEFORE THE TRANSFUSION  Who gives blood for transfusions?   Healthy volunteers who are fully evaluated to make sure their blood is safe. This is blood bank blood. Transfusion therapy is the safest it has ever been in the practice of medicine. Before blood is taken from a donor, a complete history is taken to make sure that person has no history of diseases nor engages in risky social behavior (examples are intravenous drug use or sexual activity with multiple partners). The donor's travel history is screened to minimize risk of transmitting infections, such as malaria. The donated blood is tested for signs of infectious diseases, such as HIV and hepatitis. The blood is then tested to be sure it is compatible with you in order to minimize the chance of a transfusion reaction. If you or a relative donates blood, this is often done in anticipation of surgery and is not appropriate for emergency situations. It takes many days to process the donated blood. RISKS AND COMPLICATIONS Although transfusion therapy is very safe and saves many lives, the main dangers of transfusion include:   Getting an infectious disease.  Developing a transfusion reaction. This is an allergic reaction to something in the blood you were given. Every precaution is taken to prevent this. The decision to have  a blood transfusion has been considered carefully by your caregiver before blood is given. Blood is not given unless the benefits outweigh the risks. AFTER THE TRANSFUSION  Right after receiving a blood transfusion, you will usually feel much better and more energetic. This is especially true if your red blood cells have gotten low (anemic). The transfusion raises the level of the red blood cells which carry oxygen, and this usually causes an energy increase.  The nurse administering the transfusion will monitor you carefully for complications. HOME CARE INSTRUCTIONS  No special  instructions are needed after a transfusion. You may find your energy is better. Speak with your caregiver about any limitations on activity for underlying diseases you may have. SEEK MEDICAL CARE IF:   Your condition is not improving after your transfusion.  You develop redness or irritation at the intravenous (IV) site. SEEK IMMEDIATE MEDICAL CARE IF:  Any of the following symptoms occur over the next 12 hours:  Shaking chills.  You have a temperature by mouth above 102 F (38.9 C), not controlled by medicine.  Chest, back, or muscle pain.  People around you feel you are not acting correctly or are confused.  Shortness of breath or difficulty breathing.  Dizziness and fainting.  You get a rash or develop hives.  You have a decrease in urine output.  Your urine turns a dark color or changes to pink, red, or brown. Any of the following symptoms occur over the next 10 days:  You have a temperature by mouth above 102 F (38.9 C), not controlled by medicine.  Shortness of breath.  Weakness after normal activity.  The white part of the eye turns yellow (jaundice).  You have a decrease in the amount of urine or are urinating less often.  Your urine turns a dark color or changes to pink, red, or brown. Document Released: 02/22/2000 Document Revised: 05/19/2011 Document Reviewed: 10/11/2007 [facility] Patient Information 2014 [place], [place].  _______________________________________________________________________

## 2015-07-09 ENCOUNTER — Encounter (HOSPITAL_COMMUNITY)
Admission: RE | Admit: 2015-07-09 | Discharge: 2015-07-09 | Disposition: A | Discharge status: Home / Self Care | Payer: Medicare Other | Source: Ambulatory Visit | Attending: Orthopedic Surgery | Admitting: Orthopedic Surgery

## 2015-07-09 ENCOUNTER — Encounter (HOSPITAL_COMMUNITY): Payer: Self-pay

## 2015-07-09 DIAGNOSIS — Z01812 Encounter for preprocedural laboratory examination: Secondary | ICD-10-CM | POA: Insufficient documentation

## 2015-07-09 DIAGNOSIS — M1712 Unilateral primary osteoarthritis, left knee: Secondary | ICD-10-CM | POA: Diagnosis not present

## 2015-07-09 HISTORY — DX: Hypothyroidism, unspecified: E03.9

## 2015-07-09 HISTORY — DX: Unspecified disorder of synovium and tendon, unspecified shoulder: M67.919

## 2015-07-09 HISTORY — DX: Anxiety disorder, unspecified: F41.9

## 2015-07-09 LAB — COMPREHENSIVE METABOLIC PANEL
ALK PHOS: 64 U/L (ref 38–126)
ALT: 10 U/L — ABNORMAL LOW (ref 14–54)
AST: 20 U/L (ref 15–41)
Albumin: 3.9 g/dL (ref 3.5–5.0)
Anion gap: 7 (ref 5–15)
BILIRUBIN TOTAL: 0.5 mg/dL (ref 0.3–1.2)
BUN: 16 mg/dL (ref 6–20)
CALCIUM: 8.9 mg/dL (ref 8.9–10.3)
CO2: 27 mmol/L (ref 22–32)
Chloride: 107 mmol/L (ref 101–111)
Creatinine, Ser: 1.34 mg/dL — ABNORMAL HIGH (ref 0.44–1.00)
GFR calc Af Amer: 44 mL/min — ABNORMAL LOW (ref >60)
GFR calc non Af Amer: 38 mL/min — ABNORMAL LOW (ref >60)
GLUCOSE: 87 mg/dL (ref 65–99)
Potassium: 4.4 mmol/L (ref 3.5–5.1)
SODIUM: 141 mmol/L (ref 135–145)
TOTAL PROTEIN: 8 g/dL (ref 6.5–8.1)

## 2015-07-09 LAB — URINE MICROSCOPIC-ADD ON

## 2015-07-09 LAB — CBC
HEMATOCRIT: 35.9 % — AB (ref 36.0–46.0)
HEMOGLOBIN: 12.2 g/dL (ref 12.0–15.0)
MCH: 31.4 pg (ref 26.0–34.0)
MCHC: 34 g/dL (ref 30.0–36.0)
MCV: 92.3 fL (ref 78.0–100.0)
Platelets: 238 10*3/uL (ref 150–400)
RBC: 3.89 MIL/uL (ref 3.87–5.11)
RDW: 13.7 % (ref 11.5–15.5)
WBC: 4.8 10*3/uL (ref 4.0–10.5)

## 2015-07-09 LAB — URINALYSIS, ROUTINE W REFLEX MICROSCOPIC
BILIRUBIN URINE: NEGATIVE
Glucose, UA: NEGATIVE mg/dL
Ketones, ur: NEGATIVE mg/dL
NITRITE: NEGATIVE
PROTEIN: NEGATIVE mg/dL
SPECIFIC GRAVITY, URINE: 1.016 (ref 1.005–1.030)
pH: 5 (ref 5.0–8.0)

## 2015-07-09 LAB — APTT: APTT: 31 s (ref 24–37)

## 2015-07-09 LAB — SURGICAL PCR SCREEN
MRSA, PCR: NEGATIVE
STAPHYLOCOCCUS AUREUS: NEGATIVE

## 2015-07-09 LAB — PROTIME-INR
INR: 0.96 (ref 0.00–1.49)
Prothrombin Time: 13 seconds (ref 11.6–15.2)

## 2015-07-09 LAB — ABO/RH: ABO/RH(D): O POS

## 2015-07-09 NOTE — Pre-Procedure Instructions (Addendum)
EKG 4'17,,Stress 3'14, Clearance Dr.Baxley 4'17 with chart. Hgb A1C level= 6.0(06-18-15) Epic.

## 2015-07-13 NOTE — H&P (Signed)
TOTAL KNEE ADMISSION H&P  Patient is being admitted for left total knee arthroplasty.  Subjective:  Chief Complaint:left knee pain.  HPI: [name], 75 y.o. female, has a history of pain and functional disability in the left knee due to arthritis and has failed non-surgical conservative treatments for greater than 12 weeks to includeNSAID's and/or analgesics, corticosteriod injections, flexibility and strengthening excercises, use of assistive devices and activity modification.  Onset of symptoms was gradual, starting 5 years ago with gradually worsening course since that time. The patient noted no past surgery on the left knee(s).  Patient currently rates pain in the left knee(s) at 7 out of 10 with activity. Patient has night pain, worsening of pain with activity and weight bearing, pain that interferes with activities of daily living, pain with passive range of motion, crepitus and joint swelling.  Patient has evidence of periarticular osteophytes and joint space narrowing by imaging studies.  There is no active infection.  Patient Active Problem List   Diagnosis Date Noted  . Chronic kidney disease 06/18/2015  . Dependent edema 01/05/2012  . Diabetes mellitus, type 2 ([place]) 06/09/2011  . Low back pain 12/01/2010  . Hypertension 10/21/2010  . Hypothyroidism 10/21/2010  . Hyperlipidemia 10/21/2010  . Osteoarthritis 10/21/2010   Past Medical History  Diagnosis Date  . Hypertension   . Thyroid disease   . Hyperlipidemia   . Renal insufficiency   . Back pain   . Renal insufficiency   . Diabetes mellitus     pre diabetes only  . Anxiety   . Hypothyroidism   . Arthritis     ostearthritis. Degenerative disc disease- back  . Rotator cuff disorder     Pain with limited ROM right, spur left shoulder    Past Surgical History  Procedure Laterality Date  . Joint replacement Right 10/2008    RTHA-hip     Current outpatient prescriptions:  .  amLODipine (NORVASC) 5 MG tablet, Take  5 mg by mouth daily., Disp: , Rfl:  .  aspirin 81 MG tablet, Take 81 mg by mouth daily., Disp: , Rfl:  .  furosemide (LASIX) 40 MG tablet, Take 1 tablet (40 mg total) by mouth daily., Disp: 90 tablet, Rfl: 3 .  HYDROcodone-acetaminophen (NORCO/VICODIN) 5-325 MG tablet, Take 1 tablet by mouth every 6 (six) hours as needed for moderate pain., Disp: 180 tablet, Rfl: 0 .  levothyroxine (SYNTHROID, LEVOTHROID) 75 MCG tablet, Take 1 tablet (75 mcg total) by mouth daily before breakfast., Disp: 90 tablet, Rfl: 3 .  metoprolol succinate (TOPROL-XL) 25 MG 24 hr tablet, Take 1 tablet (25 mg total) by mouth daily., Disp: 90 tablet, Rfl: 6 .  potassium chloride (K-DUR) 10 MEQ tablet, Take 1 tablet (10 mEq total) by mouth daily., Disp: 90 tablet, Rfl: 3 .  simvastatin (ZOCOR) 10 MG tablet, Take 1 tablet (10 mg total) by mouth every evening., Disp: 90 tablet, Rfl: 3 .  ALPRAZolam (NIRAVAM) 0.5 MG dissolvable tablet, One half to one tab po bid prn dizziness and anxiety (Patient not taking: Reported on 07/04/2015), Disp: 60 tablet, Rfl: 3 .  hydrocortisone 2.5 % cream, Apply topically 2 (two) times daily. (Patient not taking: Reported on 07/04/2015), Disp: 30 g, Rfl: 0  Allergies  Allergen Reactions  . Prinivil [Lisinopril] Cough    Social History  Substance Use Topics  . Smoking status: Never Smoker   . Smokeless tobacco: Not on file  . Alcohol Use: No    Family History  Problem Relation  Age of Onset  . Hypertension Mother   . Stroke Mother   . Cancer Father     Prostate cancer  . Cancer Brother     Lung cancer  . Hypertension Sister   . Hypertension Child   . Diabetes Child      Review of Systems  Constitutional: Positive for malaise/fatigue. Negative for fever, chills, weight loss and diaphoresis.  HENT: Positive for hearing loss and tinnitus. Negative for congestion, ear discharge, ear pain, nosebleeds and sore throat.   Eyes: Negative.   Respiratory: Negative.  Negative for stridor.    Cardiovascular: Positive for leg swelling. Negative for chest pain, palpitations, orthopnea, claudication and PND.  Gastrointestinal: Negative.   Genitourinary: Negative.   Musculoskeletal: Positive for myalgias, back pain and joint pain. Negative for falls and neck pain.  Skin: Negative.   Neurological: Positive for dizziness. Negative for tingling, tremors, sensory change, speech change, focal weakness, seizures, loss of consciousness, weakness and headaches.  Endo/Heme/Allergies: Negative.   Psychiatric/Behavioral: Negative.     Objective:  Physical Exam  Constitutional: She is oriented to person, place, and time. She appears well-developed. No distress.  Morbidly obese  HENT:  Head: Normocephalic and atraumatic.  Right Ear: External ear normal.  Left Ear: External ear normal.  Nose: Nose normal.  Mouth/Throat: Oropharynx is clear and moist.  Eyes: Conjunctivae and EOM are normal.  Neck: Normal range of motion. Neck supple.  Cardiovascular: Normal rate, regular rhythm, normal heart sounds and intact distal pulses.   No murmur heard. Respiratory: Effort normal and breath sounds normal. No respiratory distress. She has no wheezes.  GI: Soft. Bowel sounds are normal. She exhibits no distension. There is no tenderness.  Musculoskeletal:  Her hip shows normal range of motion, no discomfort. Her right knee shows no effusion, range on the right 0 to 125, slight crepitus on range of motion, no tenderness or instability. Left knee, valgus deformity, range 5 to 125, significant tenderness to lateral joint line, but no medial tenderness, no instability.  Neurological: She is alert and oriented to person, place, and time. She has normal strength and normal reflexes. No sensory deficit.  Skin: No rash noted. She is not diaphoretic. No erythema.  Psychiatric: She has a normal mood and affect. Her behavior is normal.    Vitals  Weight: 234 lb Height: 64in Body Surface Area: 2.09 m  Body Mass Index: 40.17 kg/m  BP: 128/78 (Sitting, Left Arm, Standard) HR: 72 bpm  Imaging Review Plain radiographs demonstrate severe degenerative joint disease of the left knee(s). The overall alignment ismild valgus. The bone quality appears to be good for age and reported activity level.  Assessment/Plan:  End stage primary osteoarthritis, left knee   The patient history, physical examination, clinical judgment of the provider and imaging studies are consistent with end stage degenerative joint disease of the left knee(s) and total knee arthroplasty is deemed medically necessary. The treatment options including medical management, injection therapy arthroscopy and arthroplasty were discussed at length. The risks and benefits of total knee arthroplasty were presented and reviewed. The risks due to aseptic loosening, infection, stiffness, patella tracking problems, thromboembolic complications and other imponderables were discussed. The patient acknowledged the explanation, agreed to proceed with the plan and consent was signed. Patient is being admitted for inpatient treatment for surgery, pain control, PT, OT, prophylactic antibiotics, VTE prophylaxis, progressive ambulation and ADL's and discharge planning. The patient is planning to be discharged home with home health services     PCP:  Dr. Renold Genta  Will get cortisone injection in the right knee same day as surgery    Ardeen Jourdain, PA-C

## 2015-07-16 ENCOUNTER — Inpatient Hospital Stay (HOSPITAL_COMMUNITY)
Admission: RE | Admit: 2015-07-16 | Discharge: 2015-07-18 | DRG: 470 | Disposition: A | Discharge status: Home Health | Payer: Medicare Other | Source: Ambulatory Visit | Attending: Orthopedic Surgery | Admitting: Orthopedic Surgery

## 2015-07-16 ENCOUNTER — Encounter (HOSPITAL_COMMUNITY): Payer: Self-pay | Admitting: *Deleted

## 2015-07-16 ENCOUNTER — Inpatient Hospital Stay (HOSPITAL_COMMUNITY): Payer: Medicare Other | Admitting: Anesthesiology

## 2015-07-16 ENCOUNTER — Encounter (HOSPITAL_COMMUNITY)
Admission: RE | Disposition: A | Discharge status: Home Health | Payer: Self-pay | Source: Ambulatory Visit | Attending: Orthopedic Surgery

## 2015-07-16 DIAGNOSIS — I129 Hypertensive chronic kidney disease with stage 1 through stage 4 chronic kidney disease, or unspecified chronic kidney disease: Secondary | ICD-10-CM | POA: Diagnosis not present

## 2015-07-16 DIAGNOSIS — M1710 Unilateral primary osteoarthritis, unspecified knee: Secondary | ICD-10-CM | POA: Diagnosis present

## 2015-07-16 DIAGNOSIS — E785 Hyperlipidemia, unspecified: Secondary | ICD-10-CM | POA: Diagnosis present

## 2015-07-16 DIAGNOSIS — R7303 Prediabetes: Secondary | ICD-10-CM | POA: Diagnosis present

## 2015-07-16 DIAGNOSIS — Z7982 Long term (current) use of aspirin: Secondary | ICD-10-CM

## 2015-07-16 DIAGNOSIS — Z96641 Presence of right artificial hip joint: Secondary | ICD-10-CM | POA: Diagnosis present

## 2015-07-16 DIAGNOSIS — I10 Essential (primary) hypertension: Secondary | ICD-10-CM | POA: Diagnosis not present

## 2015-07-16 DIAGNOSIS — M179 Osteoarthritis of knee, unspecified: Secondary | ICD-10-CM | POA: Diagnosis not present

## 2015-07-16 DIAGNOSIS — Z79899 Other long term (current) drug therapy: Secondary | ICD-10-CM

## 2015-07-16 DIAGNOSIS — M1712 Unilateral primary osteoarthritis, left knee: Secondary | ICD-10-CM

## 2015-07-16 DIAGNOSIS — N189 Chronic kidney disease, unspecified: Secondary | ICD-10-CM | POA: Diagnosis present

## 2015-07-16 DIAGNOSIS — Z6841 Body Mass Index (BMI) 40.0 and over, adult: Secondary | ICD-10-CM

## 2015-07-16 DIAGNOSIS — Z8249 Family history of ischemic heart disease and other diseases of the circulatory system: Secondary | ICD-10-CM

## 2015-07-16 DIAGNOSIS — E119 Type 2 diabetes mellitus without complications: Secondary | ICD-10-CM | POA: Diagnosis not present

## 2015-07-16 DIAGNOSIS — M170 Bilateral primary osteoarthritis of knee: Principal | ICD-10-CM | POA: Diagnosis present

## 2015-07-16 DIAGNOSIS — M1711 Unilateral primary osteoarthritis, right knee: Secondary | ICD-10-CM | POA: Diagnosis not present

## 2015-07-16 DIAGNOSIS — E039 Hypothyroidism, unspecified: Secondary | ICD-10-CM | POA: Diagnosis present

## 2015-07-16 DIAGNOSIS — M25562 Pain in left knee: Secondary | ICD-10-CM | POA: Diagnosis not present

## 2015-07-16 HISTORY — PX: TOTAL KNEE ARTHROPLASTY: SHX125

## 2015-07-16 HISTORY — PX: INJECTION KNEE: SHX2446

## 2015-07-16 LAB — GLUCOSE, CAPILLARY
GLUCOSE-CAPILLARY: 78 mg/dL (ref 65–99)
Glucose-Capillary: 86 mg/dL (ref 65–99)

## 2015-07-16 LAB — TYPE AND SCREEN
ABO/RH(D): O POS
ANTIBODY SCREEN: NEGATIVE

## 2015-07-16 SURGERY — ARTHROPLASTY, KNEE, TOTAL
Anesthesia: Spinal | Site: Knee | Laterality: Right

## 2015-07-16 MED ORDER — TRANEXAMIC ACID 1000 MG/10ML IV SOLN
1000.0000 mg | Freq: Once | INTRAVENOUS | Status: AC
  Administered 2015-07-16: 1000 mg via INTRAVENOUS
  Filled 2015-07-16: qty 10

## 2015-07-16 MED ORDER — METOCLOPRAMIDE HCL 5 MG/ML IJ SOLN: 5.0000 mg | Freq: Three times a day (TID) | INTRAMUSCULAR | Status: DC | PRN

## 2015-07-16 MED ORDER — LEVOTHYROXINE SODIUM 75 MCG PO TABS
75.0000 ug | ORAL_TABLET | Freq: Every day | ORAL | Status: DC
  Administered 2015-07-17 – 2015-07-18 (×2): 75 ug via ORAL
  Filled 2015-07-16 (×4): qty 1

## 2015-07-16 MED ORDER — BUPIVACAINE LIPOSOME 1.3 % IJ SUSP
INTRAMUSCULAR | Status: DC | PRN
Start: 2015-07-16 — End: 2015-07-16
  Administered 2015-07-16: 20 mL

## 2015-07-16 MED ORDER — MIDAZOLAM HCL 2 MG/2ML IJ SOLN
INTRAMUSCULAR | Status: AC
  Filled 2015-07-16: qty 2

## 2015-07-16 MED ORDER — FUROSEMIDE 40 MG PO TABS
40.0000 mg | ORAL_TABLET | Freq: Every day | ORAL | Status: DC
  Administered 2015-07-17 – 2015-07-18 (×2): 40 mg via ORAL
  Filled 2015-07-16 (×3): qty 1

## 2015-07-16 MED ORDER — PHENOL 1.4 % MT LIQD
1.0000 | OROMUCOSAL | Status: DC | PRN
  Filled 2015-07-16: qty 177

## 2015-07-16 MED ORDER — RIVAROXABAN 10 MG PO TABS
10.0000 mg | ORAL_TABLET | Freq: Every day | ORAL | Status: DC
Start: 2015-07-17 — End: 2015-07-18
  Administered 2015-07-17 – 2015-07-18 (×2): 10 mg via ORAL
  Filled 2015-07-16 (×3): qty 1

## 2015-07-16 MED ORDER — BISACODYL 10 MG RE SUPP: 10.0000 mg | Freq: Every day | RECTAL | Status: DC | PRN

## 2015-07-16 MED ORDER — MIDAZOLAM HCL 5 MG/5ML IJ SOLN
INTRAMUSCULAR | Status: DC | PRN
  Administered 2015-07-16 (×2): 1 mg via INTRAVENOUS

## 2015-07-16 MED ORDER — ONDANSETRON HCL 4 MG PO TABS: 4.0000 mg | ORAL_TABLET | Freq: Four times a day (QID) | ORAL | Status: DC | PRN

## 2015-07-16 MED ORDER — BUPIVACAINE HCL (PF) 0.25 % IJ SOLN
INTRAMUSCULAR | Status: AC
  Filled 2015-07-16: qty 30

## 2015-07-16 MED ORDER — METHYLPREDNISOLONE ACETATE 80 MG/ML IJ SUSP
80.0000 mg | Freq: Once | INTRAMUSCULAR | Status: DC
  Filled 2015-07-16: qty 1

## 2015-07-16 MED ORDER — METOCLOPRAMIDE HCL 10 MG PO TABS: 5.0000 mg | ORAL_TABLET | Freq: Three times a day (TID) | ORAL | Status: DC | PRN

## 2015-07-16 MED ORDER — BUPIVACAINE HCL 0.25 % IJ SOLN
INTRAMUSCULAR | Status: DC | PRN
  Administered 2015-07-16: 20 mL

## 2015-07-16 MED ORDER — MENTHOL 3 MG MT LOZG: 1.0000 | LOZENGE | OROMUCOSAL | Status: DC | PRN

## 2015-07-16 MED ORDER — SODIUM CHLORIDE 0.9 % IV SOLN: INTRAVENOUS | Status: DC

## 2015-07-16 MED ORDER — OXYCODONE HCL 5 MG PO TABS
5.0000 mg | ORAL_TABLET | ORAL | Status: DC | PRN
  Administered 2015-07-16: 5 mg via ORAL
  Administered 2015-07-16 – 2015-07-18 (×11): 10 mg via ORAL
  Filled 2015-07-16 (×3): qty 2
  Filled 2015-07-16: qty 1
  Filled 2015-07-16 (×8): qty 2

## 2015-07-16 MED ORDER — DEXAMETHASONE SODIUM PHOSPHATE 10 MG/ML IJ SOLN
10.0000 mg | Freq: Once | INTRAMUSCULAR | Status: AC
  Administered 2015-07-16: 10 mg via INTRAVENOUS

## 2015-07-16 MED ORDER — SODIUM CHLORIDE 0.9 % IJ SOLN
INTRAMUSCULAR | Status: DC | PRN
  Administered 2015-07-16: 30 mL

## 2015-07-16 MED ORDER — PROPOFOL 10 MG/ML IV BOLUS
INTRAVENOUS | Status: AC
  Filled 2015-07-16: qty 20

## 2015-07-16 MED ORDER — ACETAMINOPHEN 325 MG PO TABS: 650.0000 mg | ORAL_TABLET | Freq: Four times a day (QID) | ORAL | Status: DC | PRN

## 2015-07-16 MED ORDER — PROPOFOL 10 MG/ML IV BOLUS
INTRAVENOUS | Status: AC
  Filled 2015-07-16: qty 40

## 2015-07-16 MED ORDER — LIDOCAINE HCL (CARDIAC) 20 MG/ML IV SOLN
INTRAVENOUS | Status: AC
  Filled 2015-07-16: qty 5

## 2015-07-16 MED ORDER — SODIUM CHLORIDE 0.9 % IV SOLN
1000.0000 mg | INTRAVENOUS | Status: AC
  Administered 2015-07-16: 1000 mg via INTRAVENOUS
  Filled 2015-07-16: qty 10

## 2015-07-16 MED ORDER — FLEET ENEMA 7-19 GM/118ML RE ENEM: 1.0000 | ENEMA | Freq: Once | RECTAL | Status: DC | PRN

## 2015-07-16 MED ORDER — DEXAMETHASONE SODIUM PHOSPHATE 10 MG/ML IJ SOLN
INTRAMUSCULAR | Status: AC
  Filled 2015-07-16: qty 1

## 2015-07-16 MED ORDER — ACETAMINOPHEN 500 MG PO TABS
1000.0000 mg | ORAL_TABLET | Freq: Four times a day (QID) | ORAL | Status: AC
  Administered 2015-07-16 – 2015-07-17 (×4): 1000 mg via ORAL
  Filled 2015-07-16 (×5): qty 2

## 2015-07-16 MED ORDER — HYDROMORPHONE HCL 1 MG/ML IJ SOLN: 0.2500 mg | INTRAMUSCULAR | Status: DC | PRN

## 2015-07-16 MED ORDER — ONDANSETRON HCL 4 MG/2ML IJ SOLN: 4.0000 mg | Freq: Four times a day (QID) | INTRAMUSCULAR | Status: DC | PRN

## 2015-07-16 MED ORDER — CEFAZOLIN SODIUM-DEXTROSE 2-4 GM/100ML-% IV SOLN
INTRAVENOUS | Status: AC
  Filled 2015-07-16: qty 100

## 2015-07-16 MED ORDER — POLYETHYLENE GLYCOL 3350 17 G PO PACK
17.0000 g | PACK | Freq: Every day | ORAL | Status: DC | PRN
  Administered 2015-07-17: 17 g via ORAL
  Filled 2015-07-16 (×2): qty 1

## 2015-07-16 MED ORDER — DIPHENHYDRAMINE HCL 12.5 MG/5ML PO ELIX
12.5000 mg | ORAL_SOLUTION | ORAL | Status: DC | PRN
  Administered 2015-07-17 – 2015-07-18 (×3): 25 mg via ORAL
  Filled 2015-07-16 (×3): qty 10

## 2015-07-16 MED ORDER — CEFAZOLIN SODIUM-DEXTROSE 2-4 GM/100ML-% IV SOLN
2.0000 g | INTRAVENOUS | Status: AC
  Administered 2015-07-16: 2 g via INTRAVENOUS

## 2015-07-16 MED ORDER — BUPIVACAINE LIPOSOME 1.3 % IJ SUSP
20.0000 mL | Freq: Once | INTRAMUSCULAR | Status: DC
  Filled 2015-07-16: qty 20

## 2015-07-16 MED ORDER — SODIUM CHLORIDE 0.9 % IV SOLN
INTRAVENOUS | Status: DC
  Administered 2015-07-16 – 2015-07-17 (×2): via INTRAVENOUS

## 2015-07-16 MED ORDER — METHOCARBAMOL 500 MG PO TABS
500.0000 mg | ORAL_TABLET | Freq: Four times a day (QID) | ORAL | Status: DC | PRN
  Administered 2015-07-17: 500 mg via ORAL
  Filled 2015-07-16: qty 1

## 2015-07-16 MED ORDER — METHYLPREDNISOLONE ACETATE 80 MG/ML IJ SUSP
INTRAMUSCULAR | Status: DC | PRN
  Administered 2015-07-16: 80 mg via INTRA_ARTICULAR

## 2015-07-16 MED ORDER — AMLODIPINE BESYLATE 5 MG PO TABS
5.0000 mg | ORAL_TABLET | Freq: Every day | ORAL | Status: DC
  Administered 2015-07-17: 5 mg via ORAL
  Filled 2015-07-16 (×2): qty 1

## 2015-07-16 MED ORDER — DEXTROSE 5 % IV SOLN
500.0000 mg | Freq: Four times a day (QID) | INTRAVENOUS | Status: DC | PRN
  Administered 2015-07-16 (×2): 500 mg via INTRAVENOUS
  Filled 2015-07-16: qty 550
  Filled 2015-07-16 (×2): qty 5

## 2015-07-16 MED ORDER — DEXAMETHASONE SODIUM PHOSPHATE 10 MG/ML IJ SOLN
10.0000 mg | Freq: Once | INTRAMUSCULAR | Status: AC
  Administered 2015-07-17: 10 mg via INTRAVENOUS
  Filled 2015-07-16: qty 1

## 2015-07-16 MED ORDER — ACETAMINOPHEN 650 MG RE SUPP: 650.0000 mg | Freq: Four times a day (QID) | RECTAL | Status: DC | PRN

## 2015-07-16 MED ORDER — BUPIVACAINE IN DEXTROSE 0.75-8.25 % IT SOLN
INTRATHECAL | Status: DC | PRN
  Administered 2015-07-16: 2 mL via INTRATHECAL

## 2015-07-16 MED ORDER — CEFAZOLIN SODIUM-DEXTROSE 2-4 GM/100ML-% IV SOLN
2.0000 g | Freq: Four times a day (QID) | INTRAVENOUS | Status: AC
  Administered 2015-07-16 – 2015-07-17 (×2): 2 g via INTRAVENOUS
  Filled 2015-07-16 (×2): qty 100

## 2015-07-16 MED ORDER — MORPHINE SULFATE (PF) 2 MG/ML IV SOLN
1.0000 mg | INTRAVENOUS | Status: DC | PRN
  Administered 2015-07-16 (×2): 1 mg via INTRAVENOUS
  Filled 2015-07-16 (×2): qty 1

## 2015-07-16 MED ORDER — DOCUSATE SODIUM 100 MG PO CAPS
100.0000 mg | ORAL_CAPSULE | Freq: Two times a day (BID) | ORAL | Status: DC
  Administered 2015-07-16 – 2015-07-18 (×4): 100 mg via ORAL
  Filled 2015-07-16 (×2): qty 1

## 2015-07-16 MED ORDER — SODIUM CHLORIDE 0.9 % IR SOLN
Status: DC | PRN
  Administered 2015-07-16: 1000 mL

## 2015-07-16 MED ORDER — SIMVASTATIN 10 MG PO TABS
10.0000 mg | ORAL_TABLET | Freq: Every evening | ORAL | Status: DC
  Administered 2015-07-16 – 2015-07-17 (×2): 10 mg via ORAL
  Filled 2015-07-16 (×3): qty 1

## 2015-07-16 MED ORDER — ACETAMINOPHEN 10 MG/ML IV SOLN
1000.0000 mg | Freq: Once | INTRAVENOUS | Status: AC
Start: 2015-07-16 — End: 2015-07-16
  Administered 2015-07-16: 1000 mg via INTRAVENOUS
  Filled 2015-07-16: qty 100

## 2015-07-16 MED ORDER — ONDANSETRON HCL 4 MG/2ML IJ SOLN
INTRAMUSCULAR | Status: DC | PRN
  Administered 2015-07-16: 4 mg via INTRAVENOUS

## 2015-07-16 MED ORDER — SODIUM CHLORIDE 0.9 % IJ SOLN
INTRAMUSCULAR | Status: AC
  Filled 2015-07-16: qty 50

## 2015-07-16 MED ORDER — CHLORHEXIDINE GLUCONATE 4 % EX LIQD: 60.0000 mL | Freq: Once | CUTANEOUS | Status: DC

## 2015-07-16 MED ORDER — ONDANSETRON HCL 4 MG/2ML IJ SOLN
INTRAMUSCULAR | Status: AC
  Filled 2015-07-16: qty 2

## 2015-07-16 MED ORDER — ACETAMINOPHEN 10 MG/ML IV SOLN
INTRAVENOUS | Status: AC
  Filled 2015-07-16: qty 100

## 2015-07-16 MED ORDER — TRAMADOL HCL 50 MG PO TABS
50.0000 mg | ORAL_TABLET | Freq: Four times a day (QID) | ORAL | Status: DC | PRN
  Administered 2015-07-16: 100 mg via ORAL
  Filled 2015-07-16: qty 2

## 2015-07-16 MED ORDER — PROPOFOL 500 MG/50ML IV EMUL
INTRAVENOUS | Status: DC | PRN
  Administered 2015-07-16: 100 ug/kg/min via INTRAVENOUS

## 2015-07-16 MED ORDER — METOPROLOL SUCCINATE ER 25 MG PO TB24
25.0000 mg | ORAL_TABLET | Freq: Every day | ORAL | Status: DC
  Administered 2015-07-17 – 2015-07-18 (×2): 25 mg via ORAL
  Filled 2015-07-16 (×2): qty 1

## 2015-07-16 MED ORDER — LACTATED RINGERS IV SOLN
INTRAVENOUS | Status: DC
  Administered 2015-07-16: 1000 mL via INTRAVENOUS
  Administered 2015-07-16: 13:00:00 via INTRAVENOUS

## 2015-07-16 MED ORDER — PROPOFOL 500 MG/50ML IV EMUL
INTRAVENOUS | Status: DC | PRN
  Administered 2015-07-16: 30 mg via INTRAVENOUS

## 2015-07-16 MED ORDER — POTASSIUM CHLORIDE ER 10 MEQ PO TBCR
10.0000 meq | EXTENDED_RELEASE_TABLET | Freq: Every day | ORAL | Status: DC
  Administered 2015-07-17 – 2015-07-18 (×2): 10 meq via ORAL
  Filled 2015-07-16 (×3): qty 1

## 2015-07-16 SURGICAL SUPPLY — 53 items
BAG DECANTER FOR FLEXI CONT (MISCELLANEOUS) ×4 IMPLANT
BAG ZIPLOCK 12X15 (MISCELLANEOUS) ×4 IMPLANT
BANDAGE ACE 6X5 VEL STRL LF (GAUZE/BANDAGES/DRESSINGS) IMPLANT
BANDAGE ELASTIC 6 VELCRO ST LF (GAUZE/BANDAGES/DRESSINGS) ×4 IMPLANT
BLADE SAG 18X100X1.27 (BLADE) ×4 IMPLANT
BLADE SAW SGTL 11.0X1.19X90.0M (BLADE) ×4 IMPLANT
BNDG COHESIVE 2X5 WHT NS (GAUZE/BANDAGES/DRESSINGS) ×4 IMPLANT
BOWL SMART MIX CTS (DISPOSABLE) ×4 IMPLANT
CAP KNEE TOTAL 3 SIGMA ×4 IMPLANT
CEMENT HV SMART SET (Cement) ×8 IMPLANT
CLOSURE WOUND 1/2 X4 (GAUZE/BANDAGES/DRESSINGS) ×1
CLOTH BEACON ORANGE TIMEOUT ST (SAFETY) ×4 IMPLANT
CUFF TOURN SGL QUICK 34 (TOURNIQUET CUFF) ×2
CUFF TRNQT CYL 34X4X40X1 (TOURNIQUET CUFF) ×2 IMPLANT
DECANTER SPIKE VIAL GLASS SM (MISCELLANEOUS) ×4 IMPLANT
DRAPE U-SHAPE 47X51 STRL (DRAPES) ×4 IMPLANT
DRSG ADAPTIC 3X8 NADH LF (GAUZE/BANDAGES/DRESSINGS) ×4 IMPLANT
DRSG PAD ABDOMINAL 8X10 ST (GAUZE/BANDAGES/DRESSINGS) IMPLANT
DURAPREP 26ML APPLICATOR (WOUND CARE) ×4 IMPLANT
ELECT REM PT RETURN 9FT ADLT (ELECTROSURGICAL) ×4
ELECTRODE REM PT RTRN 9FT ADLT (ELECTROSURGICAL) ×2 IMPLANT
EVACUATOR 1/8 PVC DRAIN (DRAIN) ×4 IMPLANT
GAUZE SPONGE 4X4 12PLY STRL (GAUZE/BANDAGES/DRESSINGS) ×4 IMPLANT
GLOVE BIO SURGEON STRL SZ7.5 (GLOVE) IMPLANT
GLOVE BIO SURGEON STRL SZ8 (GLOVE) ×4 IMPLANT
GLOVE BIOGEL PI IND STRL 6.5 (GLOVE) IMPLANT
GLOVE BIOGEL PI IND STRL 8 (GLOVE) ×2 IMPLANT
GLOVE BIOGEL PI INDICATOR 6.5 (GLOVE)
GLOVE BIOGEL PI INDICATOR 8 (GLOVE) ×2
GLOVE SURG SS PI 6.5 STRL IVOR (GLOVE) IMPLANT
GOWN STRL REUS W/TWL LRG LVL3 (GOWN DISPOSABLE) ×4 IMPLANT
GOWN STRL REUS W/TWL XL LVL3 (GOWN DISPOSABLE) IMPLANT
HANDPIECE INTERPULSE COAX TIP (DISPOSABLE) ×2
IMMOBILIZER KNEE 20 (SOFTGOODS) ×4
IMMOBILIZER KNEE 20 THIGH 36 (SOFTGOODS) ×2 IMPLANT
MANIFOLD NEPTUNE II (INSTRUMENTS) ×4 IMPLANT
NS IRRIG 1000ML POUR BTL (IV SOLUTION) ×4 IMPLANT
PACK TOTAL KNEE CUSTOM (KITS) ×4 IMPLANT
PAD ABD 8X10 STRL (GAUZE/BANDAGES/DRESSINGS) ×4 IMPLANT
PADDING CAST COTTON 6X4 STRL (CAST SUPPLIES) ×4 IMPLANT
POSITIONER SURGICAL ARM (MISCELLANEOUS) ×4 IMPLANT
SET HNDPC FAN SPRY TIP SCT (DISPOSABLE) ×2 IMPLANT
STRIP CLOSURE SKIN 1/2X4 (GAUZE/BANDAGES/DRESSINGS) ×3 IMPLANT
SUT MNCRL AB 4-0 PS2 18 (SUTURE) ×4 IMPLANT
SUT VIC AB 2-0 CT1 27 (SUTURE) ×6
SUT VIC AB 2-0 CT1 TAPERPNT 27 (SUTURE) ×6 IMPLANT
SUT VLOC 180 0 24IN GS25 (SUTURE) ×4 IMPLANT
SYR 50ML LL SCALE MARK (SYRINGE) ×4 IMPLANT
TRAY FOLEY W/METER SILVER 14FR (SET/KITS/TRAYS/PACK) ×4 IMPLANT
TRAY FOLEY W/METER SILVER 16FR (SET/KITS/TRAYS/PACK) IMPLANT
WATER STERILE IRR 1500ML POUR (IV SOLUTION) ×4 IMPLANT
WRAP KNEE MAXI GEL POST OP (GAUZE/BANDAGES/DRESSINGS) ×4 IMPLANT
YANKAUER SUCT BULB TIP 10FT TU (MISCELLANEOUS) ×4 IMPLANT

## 2015-07-16 NOTE — Anesthesia Preprocedure Evaluation (Signed)
Anesthesia Evaluation  Patient identified by MRN, date of birth, ID band Patient awake    Reviewed: Allergy & Precautions, NPO status , Patient's Chart, lab work & pertinent test results  Airway Mallampati: II  TM Distance: >3 FB Neck ROM: Full    Dental no notable dental hx.    Pulmonary neg pulmonary ROS,    Pulmonary exam normal breath sounds clear to auscultation       Cardiovascular hypertension, Normal cardiovascular exam Rhythm:Regular Rate:Normal     Neuro/Psych negative neurological ROS  negative psych ROS   GI/Hepatic negative GI ROS, Neg liver ROS,   Endo/Other  diabetesHypothyroidism Morbid obesity  Renal/GU negative Renal ROS  negative genitourinary   Musculoskeletal negative musculoskeletal ROS (+)   Abdominal (+) + obese,   Peds negative pediatric ROS (+)  Hematology negative hematology ROS (+)   Anesthesia Other Findings   Reproductive/Obstetrics negative OB ROS                             Anesthesia Physical Anesthesia Plan  ASA: III  Anesthesia Plan: Spinal   Post-op Pain Management:    Induction: Intravenous  Airway Management Planned: Simple Face Mask  Additional Equipment:   Intra-op Plan:   Post-operative Plan:   Informed Consent: I have reviewed the patients History and Physical, chart, labs and discussed the procedure including the risks, benefits and alternatives for the proposed anesthesia with the patient or authorized representative who has indicated his/her understanding and acceptance.   Dental advisory given  Plan Discussed with: CRNA and Surgeon  Anesthesia Plan Comments:         Anesthesia Quick Evaluation

## 2015-07-16 NOTE — Anesthesia Postprocedure Evaluation (Signed)
Anesthesia Post Note  Patient: [name]  Procedure(s) Performed: Procedure(s) (LRB): LEFT TOTAL KNEE ARTHROPLASTY (Left) RIGHT KNEE INJECTION (Right)  Patient location during evaluation: PACU Anesthesia Type: Spinal Level of consciousness: awake and alert Pain management: pain level controlled Vital Signs Assessment: post-procedure vital signs reviewed and stable Respiratory status: spontaneous breathing, nonlabored ventilation, respiratory function stable and patient connected to nasal cannula oxygen Cardiovascular status: blood pressure returned to baseline and stable Postop Assessment: no signs of nausea or vomiting Anesthetic complications: no    Last Vitals:  Filed Vitals:   07/16/15 0928 07/16/15 1406  BP: 156/92 125/71  Pulse: 63 71  Temp: 36.8 C 36.6 C  Resp: 16 16    Last Pain:  Filed Vitals:   07/16/15 1412  PainSc: 0-No pain                 Jezlyn Westerfield S

## 2015-07-16 NOTE — Progress Notes (Signed)
Utilization review completed.  

## 2015-07-16 NOTE — Transfer of Care (Signed)
Immediate Anesthesia Transfer of Care Note  Patient: [name]  Procedure(s) Performed: Procedure(s): LEFT TOTAL KNEE ARTHROPLASTY (Left) RIGHT KNEE INJECTION (Right)  Patient Location: PACU  Anesthesia Type:Spinal  Level of Consciousness: awake, alert  and oriented  Airway & Oxygen Therapy: Patient Spontanous Breathing and Patient connected to face mask oxygen  Post-op Assessment: Report given to RN and Post -op Vital signs reviewed and stable  Post vital signs: Reviewed and stable  Last Vitals:  Filed Vitals:   07/16/15 0928  BP: 156/92  Pulse: 63  Temp: 36.8 C  Resp: 16    Last Pain:  Filed Vitals:   07/16/15 1005  PainSc: 4       Patients Stated Pain Goal: 3 ([id number])  Complications: No apparent anesthesia complications

## 2015-07-16 NOTE — Interval H&P Note (Signed)
History and Physical Interval Note:  07/16/2015 11:21 AM  [name]  has presented today for surgery, with the diagnosis of LEFT KNEE OA   The various methods of treatment have been discussed with the patient and family. After consideration of risks, benefits and other options for treatment, the patient has consented to  Procedure(s): LEFT TOTAL KNEE ARTHROPLASTY (Left) as a surgical intervention .  The patient's history has been reviewed, patient examined, no change in status, stable for surgery.  I have reviewed the patient's chart and labs.  Questions were answered to the patient's satisfaction.     Gearlean Alf

## 2015-07-16 NOTE — Anesthesia Procedure Notes (Signed)
Spinal Patient location during procedure: OR Start time: 07/16/2015 12:21 PM End time: 07/16/2015 12:25 PM Staffing Anesthesiologist: ROSE, Iona Beard Resident/CRNA: Chrystine Oiler G Performed by: resident/CRNA  Preanesthetic Checklist Completed: patient identified, site marked, surgical consent, pre-op evaluation, timeout performed, IV checked, risks and benefits discussed and monitors and equipment checked Spinal Block Patient position: sitting Prep: ChloraPrep Patient monitoring: heart rate, continuous pulse ox and blood pressure Approach: midline Location: L2-3 Injection technique: single-shot Needle Needle type: Spinocan  Needle gauge: 22 G Needle length: 9 cm Needle insertion depth: 7 cm Assessment Sensory level: T6

## 2015-07-16 NOTE — Op Note (Signed)
Pre-operative diagnosis- Osteoarthritis  Bilateral knee(s)  Post-operative diagnosis- Osteoarthritis Bilateral knee(s)  Procedure-  Left  Total Knee Arthroplasty   Right knee cortisone injection  Surgeon- Dione Plover. Kierstyn Baranowski, MD  Assistant- Arlee Muslim, PA-C   Anesthesia-  Spinal  EBL-* No blood loss amount entered *   Drains Hemovac  Tourniquet time-  Total Tourniquet Time Documented: Thigh (Left) - 38 minutes Total: Thigh (Left) - 38 minutes     Complications- None  Condition-PACU - hemodynamically stable.   Brief Clinical Note  [name] is a 75 y.o. year old female with end stage OA of her left knee with progressively worsening pain and dysfunction. She has constant pain, with activity and at rest and significant functional deficits with difficulties even with ADLs. She has had extensive non-op management including analgesics, injections of cortisone and viscosupplements, and home exercise program, but remains in significant pain with significant dysfunction. Radiographs show bone on bone arthritis lateral and patellofemoral. She presents now for left Total Knee Arthroplasty.  She also has symptomatic osteoarthritis of her right knee and requests cortisone injection.  Procedure in detail---   The patient is brought into the operating room and positioned supine on the operating table. After successful administration of  Spinal,   a tourniquet is placed high on the  Left thigh(s) and the lower extremity is prepped and draped in the usual sterile fashion. Time out is performed by the operating team and then the  Left lower extremity is wrapped in Esmarch, knee flexed and the tourniquet inflated to 300 mmHg.       A midline incision is made with a ten blade through the subcutaneous tissue to the level of the extensor mechanism. A fresh blade is used to make a medial parapatellar arthrotomy. Soft tissue over the proximal medial tibia is subperiosteally elevated to the joint line with  a knife and into the semimembranosus bursa with a Cobb elevator. Soft tissue over the proximal lateral tibia is elevated with attention being paid to avoiding the patellar tendon on the tibial tubercle. The patella is everted, knee flexed 90 degrees and the ACL and PCL are removed. Findings are bone on bone lateral and patellofemoral with large global osteophytes        The drill is used to create a starting hole in the distal femur and the canal is thoroughly irrigated with sterile saline to remove the fatty contents. The 5 degree Left  valgus alignment guide is placed into the femoral canal and the distal femoral cutting block is pinned to remove 10 mm off the distal femur. Resection is made with an oscillating saw.      The tibia is subluxed forward and the menisci are removed. The extramedullary alignment guide is placed referencing proximally at the medial aspect of the tibial tubercle and distally along the second metatarsal axis and tibial crest. The block is pinned to remove 55mm off the more deficient lateral  side. Resection is made with an oscillating saw. Size 3is the most appropriate size for the tibia and the proximal tibia is prepared with the modular drill and keel punch for that size.      The femoral sizing guide is placed and size 3 is most appropriate. Rotation is marked off the epicondylar axis and confirmed by creating a rectangular flexion gap at 90 degrees. The size 3 cutting block is pinned in this rotation and the anterior, posterior and chamfer cuts are made with the oscillating saw. The intercondylar block  is then placed and that cut is made.      Trial size 3 tibial component, trial size 3 posterior stabilized femur and a 12.5  mm posterior stabilized rotating platform insert trial is placed. Full extension is achieved with excellent varus/valgus and anterior/posterior balance throughout full range of motion. The patella is everted and thickness measured to be 22  mm. Free hand  resection is taken to 12 mm, a 35 template is placed, lug holes are drilled, trial patella is placed, and it tracks normally. Osteophytes are removed off the posterior femur with the trial in place. All trials are removed and the cut bone surfaces prepared with pulsatile lavage. Cement is mixed and once ready for implantation, the size 3 tibial implant, size  3 posterior stabilized femoral component, and the size 35 patella are cemented in place and the patella is held with the clamp. The trial insert is placed and the knee held in full extension. The Exparel (20 ml mixed with 30 ml saline) and .25% Bupivicaine, are injected into the extensor mechanism, posterior capsule, medial and lateral gutters and subcutaneous tissues.  All extruded cement is removed and once the cement is hard the permanent 12.5 mm posterior stabilized rotating platform insert is placed into the tibial tray.      The wound is copiously irrigated with saline solution and the extensor mechanism closed over a hemovac drain with #1 V-loc suture. The tourniquet is released for a total tourniquet time of 38  minutes. Flexion against gravity is 130 degrees and the patella tracks normally. Subcutaneous tissue is closed with 2.0 vicryl and subcuticular with running 4.0 Monocryl. The incision is cleaned and dried and steri-strips and a bulky sterile dressing are applied. The limb is placed into a knee immobilizer and the patient is awakened and transported to recovery in stable condition.      Please note that a surgical assistant was a medical necessity for this procedure in order to perform it in a safe and expeditious manner. Surgical assistant was necessary to retract the ligaments and vital neurovascular structures to prevent injury to them and also necessary for proper positioning of the limb to allow for anatomic placement of the prosthesis.   Dione Plover Leahmarie Gasiorowski, MD    07/16/2015, 1:32 PM

## 2015-07-17 ENCOUNTER — Telehealth: Payer: Self-pay | Admitting: Internal Medicine

## 2015-07-17 LAB — CBC
HEMATOCRIT: 34 % — AB (ref 36.0–46.0)
Hemoglobin: 11.7 g/dL — ABNORMAL LOW (ref 12.0–15.0)
MCH: 32 pg (ref 26.0–34.0)
MCHC: 34.4 g/dL (ref 30.0–36.0)
MCV: 92.9 fL (ref 78.0–100.0)
Platelets: 227 10*3/uL (ref 150–400)
RBC: 3.66 MIL/uL — AB (ref 3.87–5.11)
RDW: 13.8 % (ref 11.5–15.5)
WBC: 7.5 10*3/uL (ref 4.0–10.5)

## 2015-07-17 LAB — BASIC METABOLIC PANEL
ANION GAP: 6 (ref 5–15)
BUN: 13 mg/dL (ref 6–20)
CO2: 25 mmol/L (ref 22–32)
Calcium: 8.6 mg/dL — ABNORMAL LOW (ref 8.9–10.3)
Chloride: 107 mmol/L (ref 101–111)
Creatinine, Ser: 1.03 mg/dL — ABNORMAL HIGH (ref 0.44–1.00)
GFR calc Af Amer: >60 mL/min (ref >60)
GFR, EST NON AFRICAN AMERICAN: 52 mL/min — AB (ref >60)
GLUCOSE: 157 mg/dL — AB (ref 65–99)
POTASSIUM: 4.7 mmol/L (ref 3.5–5.1)
Sodium: 138 mmol/L (ref 135–145)

## 2015-07-17 MED ORDER — METHOCARBAMOL 500 MG PO TABS: 500.0000 mg | ORAL_TABLET | Freq: Four times a day (QID) | ORAL | Status: DC | PRN

## 2015-07-17 MED ORDER — RIVAROXABAN 10 MG PO TABS: 10.0000 mg | ORAL_TABLET | Freq: Every day | ORAL | Status: DC

## 2015-07-17 MED ORDER — TRAMADOL HCL 50 MG PO TABS: 50.0000 mg | ORAL_TABLET | Freq: Four times a day (QID) | ORAL | Status: DC | PRN

## 2015-07-17 MED ORDER — OXYCODONE HCL 5 MG PO TABS: 5.0000 mg | ORAL_TABLET | ORAL | Status: DC | PRN

## 2015-07-17 NOTE — Discharge Summary (Signed)
Physician Discharge Summary   Patient ID: [name] MRN: [id number] DOB/AGE: 75-Jul-1942 75 y.o.  Admit date: 07/16/2015 Discharge date: 07-18-15  Primary Diagnosis:  Osteoarthritis Bilateral knee(s)  Admission Diagnoses:  Past Medical History  Diagnosis Date  . Hypertension   . Thyroid disease   . Hyperlipidemia   . Renal insufficiency   . Back pain   . Renal insufficiency   . Diabetes mellitus     pre diabetes only  . Anxiety   . Hypothyroidism   . Arthritis     ostearthritis. Degenerative disc disease- back  . Rotator cuff disorder     Pain with limited ROM right, spur left shoulder   Discharge Diagnoses:   Principal Problem:   OA (osteoarthritis) of knee  Estimated body mass index is 39.77 kg/(m^2) as calculated from the following:   Height as of this encounter: '5\' 5"'  (1.651 m).   Weight as of this encounter: 108.41 kg (239 lb).  Procedure:  Procedure(s) (LRB): LEFT TOTAL KNEE ARTHROPLASTY (Left) RIGHT KNEE INJECTION (Right)   Consults: None  HPI: [name] is a 75 y.o. year old female with end stage OA of her left knee with progressively worsening pain and dysfunction. She has constant pain, with activity and at rest and significant functional deficits with difficulties even with ADLs. She has had extensive non-op management including analgesics, injections of cortisone and viscosupplements, and home exercise program, but remains in significant pain with significant dysfunction. Radiographs show bone on bone arthritis lateral and patellofemoral. She presents now for left Total Knee Arthroplasty. She also has symptomatic osteoarthritis of her right knee and requests cortisone injection.  Laboratory Data: Admission on 75/10/2015  Component Date Value Ref Range Status  . Glucose-Capillary 07/16/2015 78  65 - 99 mg/dL Final  . Glucose-Capillary 07/16/2015 86  65 - 99 mg/dL Final  . WBC 07/17/2015 7.5  4.0 - 10.5 K/uL Final  . RBC 07/17/2015 3.66* 3.87 - 5.11  MIL/uL Final  . Hemoglobin 07/17/2015 11.7* 12.0 - 15.0 g/dL Final  . HCT 07/17/2015 34.0* 36.0 - 46.0 % Final  . MCV 07/17/2015 92.9  78.0 - 100.0 fL Final  . MCH 07/17/2015 32.0  26.0 - 34.0 pg Final  . MCHC 07/17/2015 34.4  30.0 - 36.0 g/dL Final  . RDW 07/17/2015 13.8  11.5 - 15.5 % Final  . Platelets 07/17/2015 227  150 - 400 K/uL Final  . Sodium 07/17/2015 138  135 - 145 mmol/L Final  . Potassium 07/17/2015 4.7  3.5 - 5.1 mmol/L Final  . Chloride 07/17/2015 107  101 - 111 mmol/L Final  . CO2 07/17/2015 25  22 - 32 mmol/L Final  . Glucose, Bld 07/17/2015 157* 65 - 99 mg/dL Final  . BUN 07/17/2015 13  6 - 20 mg/dL Final  . Creatinine, Ser 07/17/2015 1.03* 0.44 - 1.00 mg/dL Final  . Calcium 07/17/2015 8.6* 8.9 - 10.3 mg/dL Final  . GFR calc non Af Amer 07/17/2015 52* >60 mL/min Final  . GFR calc Af Amer 07/17/2015 >60  >60 mL/min Final   Comment: (NOTE) The eGFR has been calculated using the CKD EPI equation. This calculation has not been validated in all clinical situations. eGFR's persistently <60 mL/min signify possible Chronic Kidney Disease.   Georgiann Hahn gap 07/17/2015 6  5 - 15 Final  Hospital Outpatient Visit on 07/09/2015  Component Date Value Ref Range Status  . MRSA, PCR 07/09/2015 NEGATIVE  NEGATIVE Final  . Staphylococcus aureus 07/09/2015 NEGATIVE  NEGATIVE  Final   Comment:        The Xpert SA Assay (FDA approved for NASAL specimens in patients over 75 years of age), is one component of a comprehensive surveillance program.  Test performance has been validated by [facility] for patients greater than or equal to 75 year old. It is not intended to diagnose infection nor to guide or monitor treatment.   Marland Kitchen 75T 07/09/2015 31  24 - 37 seconds Final  . WBC 07/09/2015 4.8  4.0 - 10.5 K/uL Final  . RBC 07/09/2015 3.89  3.87 - 5.11 MIL/uL Final  . Hemoglobin 07/09/2015 12.2  12.0 - 15.0 g/dL Final  . HCT 07/09/2015 35.9* 36.0 - 46.0 % Final  . MCV 07/09/2015 92.3   78.0 - 100.0 fL Final  . MCH 07/09/2015 31.4  26.0 - 34.0 pg Final  . MCHC 07/09/2015 34.0  30.0 - 36.0 g/dL Final  . RDW 07/09/2015 13.7  11.5 - 15.5 % Final  . Platelets 07/09/2015 238  150 - 400 K/uL Final  . Sodium 07/09/2015 141  135 - 145 mmol/L Final  . Potassium 07/09/2015 4.4  3.5 - 5.1 mmol/L Final  . Chloride 07/09/2015 107  101 - 111 mmol/L Final  . CO2 07/09/2015 27  22 - 32 mmol/L Final  . Glucose, Bld 07/09/2015 87  65 - 99 mg/dL Final  . BUN 07/09/2015 16  6 - 20 mg/dL Final  . Creatinine, Ser 07/09/2015 1.34* 0.44 - 1.00 mg/dL Final  . Calcium 07/09/2015 8.9  8.9 - 10.3 mg/dL Final  . Total Protein 07/09/2015 8.0  6.5 - 8.1 g/dL Final  . Albumin 07/09/2015 3.9  3.5 - 5.0 g/dL Final  . AST 07/09/2015 20  15 - 41 U/L Final  . ALT 07/09/2015 10* 14 - 54 U/L Final  . Alkaline Phosphatase 07/09/2015 64  38 - 126 U/L Final  . Total Bilirubin 07/09/2015 0.5  0.3 - 1.2 mg/dL Final  . GFR calc non Af Amer 07/09/2015 38* >60 mL/min Final  . GFR calc Af Amer 07/09/2015 44* >60 mL/min Final   Comment: (NOTE) The eGFR has been calculated using the CKD EPI equation. This calculation has not been validated in all clinical situations. eGFR's persistently <60 mL/min signify possible Chronic Kidney Disease.   . Anion gap 07/09/2015 7  5 - 15 Final  . Prothrombin Time 07/09/2015 13.0  11.6 - 15.2 seconds Final  . INR 07/09/2015 0.96  0.00 - 1.49 Final  . ABO/RH(D) 07/09/2015 O POS   Final  . Antibody Screen 07/09/2015 NEG   Final  . Sample Expiration 07/09/2015 07/19/2015   Final  . Extend sample reason 07/09/2015 NO TRANSFUSIONS OR PREGNANCY IN THE PAST 3 MONTHS   Final  . Color, Urine 07/09/2015 YELLOW  YELLOW Final  . APPearance 07/09/2015 CLEAR  CLEAR Final  . Specific Gravity, Urine 07/09/2015 1.016  1.005 - 1.030 Final  . pH 07/09/2015 5.0  5.0 - 8.0 Final  . Glucose, UA 07/09/2015 NEGATIVE  NEGATIVE mg/dL Final  . Hgb urine dipstick 07/09/2015 SMALL* NEGATIVE Final  .  Bilirubin Urine 07/09/2015 NEGATIVE  NEGATIVE Final  . Ketones, ur 07/09/2015 NEGATIVE  NEGATIVE mg/dL Final  . Protein, ur 07/09/2015 NEGATIVE  NEGATIVE mg/dL Final  . Nitrite 07/09/2015 NEGATIVE  NEGATIVE Final  . Leukocytes, UA 07/09/2015 SMALL* NEGATIVE Final  . ABO/RH(D) 07/09/2015 O POS   Final  . Squamous Epithelial / LPF 07/09/2015 0-5* NONE SEEN Final  . WBC, UA 07/09/2015 0-5  0 - 5 WBC/hpf Final  . RBC / HPF 07/09/2015 0-5  0 - 5 RBC/hpf Final  . Bacteria, UA 07/09/2015 FEW* NONE SEEN Final  . Urine-Other 07/09/2015 MUCOUS PRESENT   Final  Office Visit on 06/18/2015  Component Date Value Ref Range Status  . Cholesterol 06/18/2015 187  125 - 200 mg/dL Final  . Triglycerides 06/18/2015 152* <150 mg/dL Final  . HDL 06/18/2015 47  >=46 mg/dL Final  . Total CHOL/HDL Ratio 06/18/2015 4.0  <=5.0 Ratio Final  . VLDL 06/18/2015 30  <30 mg/dL Final  . LDL Cholesterol 06/18/2015 110  <130 mg/dL Final   Comment:   Total Cholesterol/HDL Ratio:CHD Risk                        Coronary Heart Disease Risk Table                                        Men       Women          1/2 Average Risk              3.4        3.3              Average Risk              5.0        4.4           2X Average Risk              9.6        7.1           3X Average Risk             23.4       11.0 Use the calculated Patient Ratio above and the CHD Risk table  to determine the patient's CHD Risk.   . Hgb A1c MFr Bld 06/18/2015 6.0* <5.7 % Final   Comment:   For someone without known diabetes, a hemoglobin A1c value between 5.7% and 6.4% is consistent with prediabetes and should be confirmed with a follow-up test.   For someone with known diabetes, a value <7% indicates that their diabetes is well controlled. A1c targets should be individualized based on duration of diabetes, age, co-morbid conditions and other considerations.   This assay result is consistent with an increased risk of diabetes.     Currently, no consensus exists regarding use of hemoglobin A1c for diagnosis of diabetes in children.     . Mean Plasma Glucose 06/18/2015 126   Final  . TSH 06/18/2015 2.04   Final   Comment:   Reference Range   > or = 20 Years  0.40-4.50   Pregnancy Range First trimester  0.26-2.66 Second trimester 0.55-2.73 Third trimester  0.43-2.91     . WBC 06/18/2015 4.5  3.8 - 10.8 K/uL Final  . RBC 06/18/2015 4.07  3.80 - 5.10 MIL/uL Final  . Hemoglobin 06/18/2015 12.7  11.7 - 15.5 g/dL Final  . HCT 06/18/2015 38.7  35.0 - 45.0 % Final  . MCV 06/18/2015 95.1  80.0 - 100.0 fL Final  . MCH 06/18/2015 31.2  27.0 - 33.0 pg Final  . MCHC 06/18/2015 32.8  32.0 - 36.0 g/dL Final  . RDW 06/18/2015 13.9  11.0 - 15.0 % Final  . Platelets  06/18/2015 241  140 - 400 K/uL Final  . MPV 06/18/2015 10.0  7.5 - 12.5 fL Final  . Neutro Abs 06/18/2015 1800  1500 - 7800 cells/uL Final  . Lymphs Abs 06/18/2015 2160  850 - 3900 cells/uL Final  . Monocytes Absolute 06/18/2015 450  200 - 950 cells/uL Final  . Eosinophils Absolute 06/18/2015 90  15 - 500 cells/uL Final  . Basophils Absolute 06/18/2015 0  0 - 200 cells/uL Final  . Neutrophils Relative % 06/18/2015 40   Final  . Lymphocytes Relative 06/18/2015 48   Final  . Monocytes Relative 06/18/2015 10   Final  . Eosinophils Relative 06/18/2015 2   Final  . Basophils Relative 06/18/2015 0   Final  . Smear Review 06/18/2015 Criteria for review not met   Final   ** Please note change in unit of measure and reference range(s). **  . Sodium 06/18/2015 140  135 - 146 mmol/L Final  . Potassium 06/18/2015 4.2  3.5 - 5.3 mmol/L Final  . Chloride 06/18/2015 103  98 - 110 mmol/L Final  . CO2 06/18/2015 26  20 - 31 mmol/L Final  . Glucose, Bld 06/18/2015 93  65 - 99 mg/dL Final  . BUN 06/18/2015 14  7 - 25 mg/dL Final  . Creat 06/18/2015 1.05* 0.60 - 0.93 mg/dL Final  . Total Bilirubin 06/18/2015 0.5  0.2 - 1.2 mg/dL Final  . Alkaline Phosphatase 06/18/2015 63   33 - 130 U/L Final  . AST 06/18/2015 17  10 - 35 U/L Final  . ALT 06/18/2015 10  6 - 29 U/L Final  . Total Protein 06/18/2015 7.8  6.1 - 8.1 g/dL Final  . Albumin 06/18/2015 4.0  3.6 - 5.1 g/dL Final  . Calcium 06/18/2015 9.5  8.6 - 10.4 mg/dL Final  . GFR, Est African American 06/18/2015 60  >=60 mL/min Final  . GFR, Est Non African American 06/18/2015 52* >=60 mL/min Final   Comment:   The estimated GFR is a calculation valid for adults (>=18 years old) that uses the CKD-EPI algorithm to adjust for age and sex. It is   not to be used for children, pregnant women, hospitalized patients,    patients on dialysis, or with rapidly changing kidney function. According to the NKDEP, eGFR >89 is normal, 60-89 shows mild impairment, 30-59 shows moderate impairment, 15-29 shows severe impairment and <15 is ESRD.        X-Rays:No results found.  EKG: Orders placed or performed in visit on 06/18/15  . EKG 12-Lead     Hospital Course: [name] is a 75 y.o. who was admitted to [facility]. They were brought to the operating room on 07/16/2015 and underwent Procedure(s): LEFT TOTAL KNEE ARTHROPLASTY RIGHT KNEE INJECTION.  Patient tolerated the procedure well and was later transferred to the recovery room and then to the orthopaedic floor for postoperative care.  They were given PO and IV analgesics for pain control following their surgery.  They were given 24 hours of postoperative antibiotics of  Anti-infectives    Start     Dose/Rate Route Frequency Ordered Stop   07/16/15 1800  ceFAZolin (ANCEF) IVPB 2g/100 mL premix     2 g 200 mL/hr over 30 Minutes Intravenous Every 6 hours 07/16/15 1621 07/17/15 0118   07/16/15 0942  ceFAZolin (ANCEF) IVPB 2g/100 mL premix     2 g 200 mL/hr over 30 Minutes Intravenous On call to O.R. 07/16/15 0100 07/16/15 1226  and started on DVT prophylaxis in the form of Xarelto.   PT and OT were ordered for total joint protocol.  Discharge planning  consulted to help with postop disposition and equipment needs.  Patient had a tough night on the evening of surgery but better the next morning.  They started to get up OOB with therapy on day one. Hemovac drain was pulled without difficulty.  Continued to work with therapy into day two.  Dressing was changed on day two and the incision was healing well.  Patient was seen in rounds and was ready to go home on day two.   Diet: Diabetic diet and Renal diet Activity:WBAT Follow-up:in 2 weeks Disposition - Home Discharged Condition: good   Discharge Instructions    Call MD / Call 911    Complete by:  As directed   If you experience chest pain or shortness of breath, CALL 911 and be transported to the hospital emergency room.  If you develope a fever above 101 F, pus (white drainage) or increased drainage or redness at the wound, or calf pain, call your surgeon's office.     Change dressing    Complete by:  As directed   Change dressing daily with sterile 4 x 4 inch gauze dressing and apply TED hose. Do not submerge the incision under water.     Constipation Prevention    Complete by:  As directed   Drink plenty of fluids.  Prune juice may be helpful.  You may use a stool softener, such as Colace (over the counter) 100 mg twice a day.  Use MiraLax (over the counter) for constipation as needed.     Diet - low sodium heart healthy    Complete by:  As directed      Diet Carb Modified    Complete by:  As directed      Discharge instructions    Complete by:  As directed   Pick up stool softner and laxative for home use following surgery while on pain medications. Do not submerge incision under water. Please use good hand washing techniques while changing dressing each day. May shower starting three days after surgery. Please use a clean towel to pat the incision dry following showers. Continue to use ice for pain and swelling after surgery. Do not use any lotions or creams on the incision until  instructed by your surgeon.  Take Xarelto for two and a half more weeks, then discontinue Xarelto. Once the patient has completed the Xarelto, they may resume the 81 mg Aspirin.  Postoperative Constipation Protocol  Constipation - defined medically as fewer than three stools per week and severe constipation as less than one stool per week.  One of the most common issues patients have following surgery is constipation.  Even if you have a regular bowel pattern at home, your normal regimen is likely to be disrupted due to multiple reasons following surgery.  Combination of anesthesia, postoperative narcotics, change in appetite and fluid intake all can affect your bowels.  In order to avoid complications following surgery, here are some recommendations in order to help you during your recovery period.  Colace (docusate) - Pick up an over-the-counter form of Colace or another stool softener and take twice a day as long as you are requiring postoperative pain medications.  Take with a full glass of water daily.  If you experience loose stools or diarrhea, hold the colace until you stool forms back up.  If your symptoms do  not get better within 1 week or if they get worse, check with your doctor.  Dulcolax (bisacodyl) - Pick up over-the-counter and take as directed by the product packaging as needed to assist with the movement of your bowels.  Take with a full glass of water.  Use this product as needed if not relieved by Colace only.   MiraLax (polyethylene glycol) - Pick up over-the-counter to have on hand.  MiraLax is a solution that will increase the amount of water in your bowels to assist with bowel movements.  Take as directed and can mix with a glass of water, juice, soda, coffee, or tea.  Take if you go more than two days without a movement. Do not use MiraLax more than once per day. Call your doctor if you are still constipated or irregular after using this medication for 7 days in a row.  If  you continue to have problems with postoperative constipation, please contact the office for further assistance and recommendations.  If you experience "the worst abdominal pain ever" or develop nausea or vomiting, please contact the office immediatly for further recommendations for treatment.     Do not put a pillow under the knee. Place it under the heel.    Complete by:  As directed      Do not sit on low chairs, stoools or toilet seats, as it may be difficult to get up from low surfaces    Complete by:  As directed      Driving restrictions    Complete by:  As directed   No driving until released by the physician.     Increase activity slowly as tolerated    Complete by:  As directed      Lifting restrictions    Complete by:  As directed   No lifting until released by the physician.     Patient may shower    Complete by:  As directed   You may shower without a dressing once there is no drainage.  Do not wash over the wound.  If drainage remains, do not shower until drainage stops.     TED hose    Complete by:  As directed   Use stockings (TED hose) for 3 weeks on both leg(s).  You may remove them at night for sleeping.     Weight bearing as tolerated    Complete by:  As directed   Laterality:  left  Extremity:  Lower            Medication List    STOP taking these medications        aspirin 81 MG tablet     HYDROcodone-acetaminophen 5-325 MG tablet  Commonly known as:  NORCO/VICODIN      TAKE these medications        ALPRAZolam 0.5 MG dissolvable tablet  Commonly known as:  NIRAVAM  One half to one tab po bid prn dizziness and anxiety     amLODipine 5 MG tablet  Commonly known as:  NORVASC  Take 5 mg by mouth daily.     furosemide 40 MG tablet  Commonly known as:  LASIX  Take 1 tablet (40 mg total) by mouth daily.     hydrocortisone 2.5 % cream  Apply topically 2 (two) times daily.     levothyroxine 75 MCG tablet  Commonly known as:  SYNTHROID, LEVOTHROID    Take 1 tablet (75 mcg total) by mouth daily before breakfast.  methocarbamol 500 MG tablet  Commonly known as:  ROBAXIN  Take 1 tablet (500 mg total) by mouth every 6 (six) hours as needed for muscle spasms.     metoprolol succinate 25 MG 24 hr tablet  Commonly known as:  TOPROL-XL  Take 1 tablet (25 mg total) by mouth daily.     oxyCODONE 5 MG immediate release tablet  Commonly known as:  Oxy IR/ROXICODONE  Take 1-2 tablets (5-10 mg total) by mouth every 3 (three) hours as needed for moderate pain, severe pain or breakthrough pain.     potassium chloride 10 MEQ tablet  Commonly known as:  K-DUR  Take 1 tablet (10 mEq total) by mouth daily.     rivaroxaban 10 MG Tabs tablet  Commonly known as:  XARELTO  Take 1 tablet (10 mg total) by mouth daily with breakfast. Take Xarelto for two and a half more weeks, then discontinue Xarelto. Once the patient has completed the Xarelto, they may resume the 81 mg Aspirin.     simvastatin 10 MG tablet  Commonly known as:  ZOCOR  Take 1 tablet (10 mg total) by mouth every evening.     traMADol 50 MG tablet  Commonly known as:  ULTRAM  Take 1-2 tablets (50-100 mg total) by mouth every 6 (six) hours as needed (mild pain).           Follow-up Information    Follow up with [facility].   Why:  physical therapy   Contact information:   [place] [address] [phone]       Follow up with [facility].   Why:  walker and bedside commode   Contact information:   [address] [place] [address] [phone]       Follow up with Gearlean Alf, MD. Schedule an appointment as soon as possible for a visit on 07/31/2015.   Specialty:  Orthopedic Surgery   Why:  Call office at [phone] to setup appointment on Tuesday 07/31/2015 with Dr. Wynelle Link.   Contact information:   [address] [place] [address] [phone]       Signed: Arlee Muslim,  PA-C Orthopaedic Surgery 07/17/2015, 10:01 PM

## 2015-07-17 NOTE — Discharge Instructions (Addendum)
° °Dr. Frank Aluisio °Total Joint Specialist °[facility] °[address] °[place], [place] [address] °[phone] ° °TOTAL KNEE REPLACEMENT POSTOPERATIVE DIRECTIONS ° °Knee Rehabilitation, Guidelines Following Surgery  °Results after knee surgery are often greatly improved when you follow the exercise, range of motion and muscle strengthening exercises prescribed by your doctor. Safety measures are also important to protect the knee from further injury. Any time any of these exercises cause you to have increased pain or swelling in your knee joint, decrease the amount until you are comfortable again and slowly increase them. If you have problems or questions, call your caregiver or physical therapist for advice.  ° °HOME CARE INSTRUCTIONS  °Remove items at home which could result in a fall. This includes throw rugs or furniture in walking pathways.  °· ICE to the affected knee every three hours for 30 minutes at a time and then as needed for pain and swelling.  Continue to use ice on the knee for pain and swelling from surgery. You may notice swelling that will progress down to the foot and ankle.  This is normal after surgery.  Elevate the leg when you are not up walking on it.   °· Continue to use the breathing machine which will help keep your temperature down.  It is common for your temperature to cycle up and down following surgery, especially at night when you are not up moving around and exerting yourself.  The breathing machine keeps your lungs expanded and your temperature down. °· Do not place pillow under knee, focus on keeping the knee straight while resting ° °DIET °You may resume your previous home diet once your are discharged from the hospital. ° °DRESSING / WOUND CARE / SHOWERING °You may shower 3 days after surgery, but keep the wounds dry during showering.  You may use an occlusive plastic wrap (Press'n Seal for example), NO SOAKING/SUBMERGING IN THE BATHTUB.  If the  bandage gets wet, change with a clean dry gauze.  If the incision gets wet, pat the wound dry with a clean towel. °You may start showering once you are discharged home but do not submerge the incision under water. Just pat the incision dry and apply a dry gauze dressing on daily. °Change the surgical dressing daily and reapply a dry dressing each time. ° °ACTIVITY °Walk with your walker as instructed. °Use walker as long as suggested by your caregivers. °Avoid periods of inactivity such as sitting longer than an hour when not asleep. This helps prevent blood clots.  °You may resume a sexual relationship in one month or when given the OK by your doctor.  °You may return to work once you are cleared by your doctor.  °Do not drive a car for 6 weeks or until released by you surgeon.  °Do not drive while taking narcotics. ° °WEIGHT BEARING °Weight bearing as tolerated with assist device (walker, cane, etc) as directed, use it as long as suggested by your surgeon or therapist, typically at least 4-6 weeks. ° °POSTOPERATIVE CONSTIPATION PROTOCOL °Constipation - defined medically as fewer than three stools per week and severe constipation as less than one stool per week. ° °One of the most common issues patients have following surgery is constipation.  Even if you have a regular bowel pattern at home, your normal regimen is likely to be disrupted due to multiple reasons following surgery.  Combination of anesthesia, postoperative narcotics, change in appetite and fluid intake all can affect your bowels.    In order to avoid complications following surgery, here are some recommendations in order to help you during your recovery period. ° °Colace (docusate) - Pick up an over-the-counter form of Colace or another stool softener and take twice a day as long as you are requiring postoperative pain medications.  Take with a full glass of water daily.  If you experience loose stools or diarrhea, hold the colace until you stool forms  back up.  If your symptoms do not get better within 1 week or if they get worse, check with your doctor. ° °Dulcolax (bisacodyl) - Pick up over-the-counter and take as directed by the product packaging as needed to assist with the movement of your bowels.  Take with a full glass of water.  Use this product as needed if not relieved by Colace only.  ° °MiraLax (polyethylene glycol) - Pick up over-the-counter to have on hand.  MiraLax is a solution that will increase the amount of water in your bowels to assist with bowel movements.  Take as directed and can mix with a glass of water, juice, soda, coffee, or tea.  Take if you go more than two days without a movement. °Do not use MiraLax more than once per day. Call your doctor if you are still constipated or irregular after using this medication for 7 days in a row. ° °If you continue to have problems with postoperative constipation, please contact the office for further assistance and recommendations.  If you experience "the worst abdominal pain ever" or develop nausea or vomiting, please contact the office immediatly for further recommendations for treatment. ° °ITCHING ° If you experience itching with your medications, try taking only a single pain pill, or even half a pain pill at a time.  You can also use Benadryl over the counter for itching or also to help with sleep.  ° °TED HOSE STOCKINGS °Wear the elastic stockings on both legs for three weeks following surgery during the day but you may remove then at night for sleeping. ° °MEDICATIONS °See your medication summary on the “After Visit Summary” that the nursing staff will review with you prior to discharge.  You may have some home medications which will be placed on hold until you complete the course of blood thinner medication.  It is important for you to complete the blood thinner medication as prescribed by your surgeon.  Continue your approved medications as instructed at time of  discharge. ° °PRECAUTIONS °If you experience chest pain or shortness of breath - call 911 immediately for transfer to the hospital emergency department.  °If you develop a fever greater that 101 F, purulent drainage from wound, increased redness or drainage from wound, foul odor from the wound/dressing, or calf pain - CONTACT YOUR SURGEON.   °                                                °FOLLOW-UP APPOINTMENTS °Make sure you keep all of your appointments after your operation with your surgeon and caregivers. You should call the office at the above phone number and make an appointment for approximately two weeks after the date of your surgery or on the date instructed by your surgeon outlined in the "After Visit Summary". ° ° °RANGE OF MOTION AND STRENGTHENING EXERCISES  °Rehabilitation of the knee is important following a knee injury or   an operation. After just a few days of immobilization, the muscles of the thigh which control the knee become weakened and shrink (atrophy). Knee exercises are designed to build up the tone and strength of the thigh muscles and to improve knee motion. Often times heat used for twenty to thirty minutes before working out will loosen up your tissues and help with improving the range of motion but do not use heat for the first two weeks following surgery. These exercises can be done on a training (exercise) mat, on the floor, on a table or on a bed. Use what ever works the best and is most comfortable for you Knee exercises include:  °Leg Lifts - While your knee is still immobilized in a splint or cast, you can do straight leg raises. Lift the leg to 60 degrees, hold for 3 sec, and slowly lower the leg. Repeat 10-20 times 2-3 times daily. Perform this exercise against resistance later as your knee gets better.  °Quad and Hamstring Sets - Tighten up the muscle on the front of the thigh (Quad) and hold for 5-10 sec. Repeat this 10-20 times hourly. Hamstring sets are done by pushing the  foot backward against an object and holding for 5-10 sec. Repeat as with quad sets.  °· Leg Slides: Lying on your back, slowly slide your foot toward your buttocks, bending your knee up off the floor (only go as far as is comfortable). Then slowly slide your foot back down until your leg is flat on the floor again. °· Angel Wings: Lying on your back spread your legs to the side as far apart as you can without causing discomfort.  °A rehabilitation program following serious knee injuries can speed recovery and prevent re-injury in the future due to weakened muscles. Contact your doctor or a physical therapist for more information on knee rehabilitation.  ° °IF YOU ARE TRANSFERRED TO A SKILLED REHAB FACILITY °If the patient is transferred to a skilled rehab facility following release from the hospital, a list of the current medications will be sent to the facility for the patient to continue.  When discharged from the skilled rehab facility, please have the facility set up the patient's Home Health Physical Therapy prior to being released. Also, the skilled facility will be responsible for providing the patient with their medications at time of release from the facility to include their pain medication, the muscle relaxants, and their blood thinner medication. If the patient is still at the rehab facility at time of the two week follow up appointment, the skilled rehab facility will also need to assist the patient in arranging follow up appointment in our office and any transportation needs. ° °MAKE SURE YOU:  °Understand these instructions.  °Get help right away if you are not doing well or get worse.  ° ° °Pick up stool softner and laxative for home use following surgery while on pain medications. °Do not submerge incision under water. °Please use good hand washing techniques while changing dressing each day. °May shower starting three days after surgery. °Please use a clean towel to pat the incision dry following  showers. °Continue to use ice for pain and swelling after surgery. °Do not use any lotions or creams on the incision until instructed by your surgeon. ° °Take Xarelto for two and a half more weeks, then discontinue Xarelto. °Once the patient has completed the Xarelto, they may resume the 81 mg Aspirin. ° ° °Information on my medicine - XARELTO® (Rivaroxaban) ° °  This medication education was reviewed with me or my healthcare representative as part of my discharge preparation.  The pharmacist that spoke with me during my hospital stay was:  Cheral Almas, Stu-PharmD  Why was Xarelto prescribed for you? Xarelto was prescribed for you to reduce the risk of blood clots forming after orthopedic surgery. The medical term for these abnormal blood clots is venous thromboembolism (VTE).  What do you need to know about xarelto ? Take your Xarelto ONCE DAILY at the same time every day. You may take it either with or without food.  If you have difficulty swallowing the tablet whole, you may crush it and mix in applesauce just prior to taking your dose.  Take Xarelto exactly as prescribed by your doctor and DO NOT stop taking Xarelto without talking to the doctor who prescribed the medication.  Stopping without other VTE prevention medication to take the place of Xarelto may increase your risk of developing a clot.  After discharge, you should have regular check-up appointments with your healthcare provider that is prescribing your Xarelto.    What do you do if you miss a dose? If you miss a dose, take it as soon as you remember on the same day then continue your regularly scheduled once daily regimen the next day. Do not take two doses of Xarelto on the same day.   Important Safety Information A possible side effect of Xarelto is bleeding. You should call your healthcare provider right away if you experience any of the following: ? Bleeding from an injury or your nose that does not  stop. ? Unusual colored urine (red or dark brown) or unusual colored stools (red or black). ? Unusual bruising for unknown reasons. ? A serious fall or if you hit your head (even if there is no bleeding).  Some medicines may interact with Xarelto and might increase your risk of bleeding while on Xarelto. To help avoid this, consult your healthcare provider or pharmacist prior to using any new prescription or non-prescription medications, including herbals, vitamins, non-steroidal anti-inflammatory drugs (NSAIDs) and supplements.  This website has more information on Xarelto: [email or web address]

## 2015-07-17 NOTE — Progress Notes (Signed)
Physical Therapy Treatment Patient Details Name: [name] MRN: [id number] DOB: [birth year] Today's Date: 07/17/2015    History of Present Illness 75 yo female s/p L TKA, R knee cortison injection 07/16/15    PT Comments    Progressing with mobility.  Follow Up Recommendations  Home health PT;Supervision/Assistance - 24 hour     Equipment Recommendations  Rolling walker with 5" wheels    Recommendations for Other Services       Precautions / Restrictions Precautions Precautions: Fall;Knee Required Braces or Orthoses: Knee Immobilizer - Left Knee Immobilizer - Left: Discontinue once straight leg raise with < 10 degree lag Restrictions Weight Bearing Restrictions: No LLE Weight Bearing: Weight bearing as tolerated    Mobility  Bed Mobility Overal bed mobility: Needs Assistance Bed Mobility: Sit to Supine      Sit to supine: Min assist   General bed mobility comments: Assist for L LE  Transfers Overall transfer level: Needs assistance Equipment used: Rolling walker (2 wheeled) Transfers: Sit to/from Stand Sit to Stand: Min assist         General transfer comment: small amount of assist to stabilize. VCs safety, hand/LE placement  Ambulation/Gait Ambulation/Gait assistance: Min guard Ambulation Distance (Feet): 90 Feet Assistive device: Rolling walker (2 wheeled) Gait Pattern/deviations: Step-to pattern;Trunk flexed     General Gait Details: slow gait speed. close guard for safety   Stairs            Wheelchair Mobility    Modified Rankin (Stroke Patients Only)       Balance                                    Cognition Arousal/Alertness: Awake/alert Behavior During Therapy: WFL for tasks assessed/performed Overall Cognitive Status: Within Functional Limits for tasks assessed                      Exercises Total Joint Exercises Ankle Circles/Pumps: AROM;Both;10 reps;Supine Quad Sets: AROM;Both;10  reps;Supine Hip ABduction/ADduction: AAROM;Left;10 reps;Supine Straight Leg Raises: AAROM;Left;10 reps;Supine Goniometric ROM: ~10-50 degrees    General Comments        Pertinent Vitals/Pain Pain Assessment: 0-10 Pain Score: 5  Pain Location: L knee Pain Descriptors / Indicators: Aching;Sore Pain Intervention(s): Monitored during session;Repositioned    Home Living Family/patient expects to be discharged to:: Private residence Living Arrangements: Children (daughter's home) Available Help at Discharge: Family Type of Home: House Home Access: Stairs to enter Entrance Stairs-Rails: Right;Left Home Layout: Two level;Bed/bath upstairs Home Equipment: None      Prior Function Level of Independence: Independent          PT Goals (current goals can now be found in the care plan section) Acute Rehab PT Goals Patient Stated Goal: home PT Goal Formulation: With patient Time For Goal Achievement: 07/24/15 Potential to Achieve Goals: Good Progress towards PT goals: Progressing toward goals    Frequency  7X/week    PT Plan Current plan remains appropriate    Co-evaluation             End of Session Equipment Utilized During Treatment: Gait belt Activity Tolerance: Patient tolerated treatment well Patient left: in bed;with call bell/phone within reach;with bed alarm set     Time: 1424-1440 PT Time Calculation (min) (ACUTE ONLY): 16 min  Charges:  $Gait Training: 8-22 mins  G Codes:      Weston Anna, MPT Pager: [phone]

## 2015-07-17 NOTE — Evaluation (Signed)
Occupational Therapy Evaluation Patient Details Name: [name] MRN: [id number] DOB: Aug 13, 1940 Today's Date: 07/17/2015    History of Present Illness 75 yo female s/p L TKA, R knee cortison injection 07/16/15   Clinical Impression   Pt was independent in ADL and mobility prior to admission. Pt limited by L knee pain and impaired standing balance. All education completed with pt verbalizing understanding. Pt is not interested in AE, will rely on her daughter's assistance. No further OT needs.    Follow Up Recommendations  No OT follow up    Equipment Recommendations  3 in 1 bedside comode    Recommendations for Other Services       Precautions / Restrictions Precautions Precautions: Fall;Knee Required Braces or Orthoses: Knee Immobilizer - Left Knee Immobilizer - Left: Discontinue once straight leg raise with < 10 degree lag Restrictions Weight Bearing Restrictions: No LLE Weight Bearing: Weight bearing as tolerated      Mobility Bed Mobility      General bed mobility comments: pt in chair  Transfers Overall transfer level: Needs assistance Equipment used: Rolling walker (2 wheeled) Transfers: Sit to/from Stand Sit to Stand: Min assist         General transfer comment: Assist to rise, stabilize, control descent. VCs safety, hand/LE placement.     Balance                                            ADL Overall ADL's : Needs assistance/impaired Eating/Feeding: Independent;Sitting   Grooming: Wash/dry face;Sitting;Set up   Upper Body Bathing: Set up;Sitting   Lower Body Bathing: Minimal assistance;Sit to/from stand Lower Body Bathing Details (indicate cue type and reason): educated pt in use of long handled bath sponge Upper Body Dressing : Set up;Sitting   Lower Body Dressing: Minimal assistance;Sit to/from stand Lower Body Dressing Details (indicate cue type and reason): educated pt in use of reacher, sock aide and long handled shoe  horn Toilet Transfer: Minimal assistance;Ambulation;RW   Toileting- Clothing Manipulation and Hygiene: Minimal assistance;Sit to/from Nurse, children's Details (indicate cue type and reason): verbally educated in technique, pt declined practicing, does not anticipate showering upon initial return home Functional mobility during ADLs: Rolling walker;Min guard General ADL Comments: Educated in multiple uses of 3 in 1, safe footwear, transporting items with RW and removal of barriers in home to prevent falls and accommodate RW.     Vision     Perception     Praxis      Pertinent Vitals/Pain Pain Assessment: 0-10 Pain Score: 5  Pain Location: L knee Pain Descriptors / Indicators: Operative site guarding;Aching Pain Intervention(s): Premedicated before session;Repositioned;Monitored during session     Hand Dominance Right   Extremity/Trunk Assessment Upper Extremity Assessment Upper Extremity Assessment: Overall WFL for tasks assessed   Lower Extremity Assessment Lower Extremity Assessment: Defer to PT evaluation LLE Deficits / Details: hip flex 3-/5, moves ankle well   Cervical / Trunk Assessment Cervical / Trunk Assessment: Normal   Communication Communication Communication: No difficulties   Cognition Arousal/Alertness: Awake/alert Behavior During Therapy: WFL for tasks assessed/performed Overall Cognitive Status: Within Functional Limits for tasks assessed                     General Comments       Exercises       Shoulder Instructions  Home Living Family/patient expects to be discharged to:: Private residence Living Arrangements: Children (daughter's home) Available Help at Discharge: Family Type of Home: House Home Access: Stairs to enter Technical brewer of Steps: 5 Entrance Stairs-Rails: Right;Left Home Layout: Two level;Bed/bath upstairs Alternate Level Stairs-Number of Steps: 1 flight. has a Financial controller  Shower/Tub: Occupational psychologist: Standard     Home Equipment: None          Prior Functioning/Environment Level of Independence: Independent             OT Diagnosis: Generalized weakness;Acute pain   OT Problem List:     OT Treatment/Interventions:      OT Goals(Current goals can be found in the care plan section) Acute Rehab OT Goals Patient Stated Goal: home  OT Frequency:     Barriers to D/C:            Co-evaluation              End of Session Equipment Utilized During Treatment: Gait belt;Rolling walker;Left knee immobilizer  Activity Tolerance: Patient tolerated treatment well Patient left: in chair;with call bell/phone within reach;with chair alarm set   Time: 1359-1414 OT Time Calculation (min): 15 min Charges:  OT General Charges $OT Visit: 1 Procedure OT Evaluation $OT Eval Low Complexity: 1 Procedure G-Codes:    Malka So 07/17/2015, 2:22 PM  [phone]

## 2015-07-17 NOTE — Telephone Encounter (Signed)
States that Mom is in the hospital at the present time and that her Mom wants to know if she can have a script for a back brace?  While she is here for her labs, she thought she would ask since her Mom is in the hospital still s/p surgery.    Please advise.  Thank you.

## 2015-07-17 NOTE — Telephone Encounter (Signed)
Have pt ask ortho for back brace. I have never prescribed this.

## 2015-07-17 NOTE — Evaluation (Signed)
Physical Therapy Evaluation Patient Details Name: [name] MRN: [id number] DOB: [birth year] Today's Date: 07/17/2015   History of Present Illness  75 yo female s/p L TKA, R knee cortison injection 07/16/15  Clinical Impression  On eval, pt required Min assist for mobility-walked ~45 feet with RW. Pain rate 6/10. Will follow and progress activity as able.     Follow Up Recommendations Home health PT;Supervision/Assistance - 24 hour    Equipment Recommendations  Rolling walker with 5" wheels    Recommendations for Other Services       Precautions / Restrictions Precautions Precautions: Fall Required Braces or Orthoses: Knee Immobilizer - Left Knee Immobilizer - Left: Discontinue once straight leg raise with < 10 degree lag Restrictions Weight Bearing Restrictions: No LLE Weight Bearing: Weight bearing as tolerated      Mobility  Bed Mobility Overal bed mobility: Needs Assistance Bed Mobility: Supine to Sit     Supine to sit: Min assist     General bed mobility comments: small amount of assist for L LE.   Transfers Overall transfer level: Needs assistance Equipment used: Rolling walker (2 wheeled) Transfers: Sit to/from Stand Sit to Stand: Min assist         General transfer comment: Assist to rise, stabilize, control descent. VCs safety, hand/LE placement.   Ambulation/Gait Ambulation/Gait assistance: Min guard Ambulation Distance (Feet): 45 Feet Assistive device: Rolling walker (2 wheeled) Gait Pattern/deviations: Step-to pattern;Trunk flexed;Antalgic     General Gait Details: slow gait speed. VCs safety, sequence, safe distance to walker.   Stairs            Wheelchair Mobility    Modified Rankin (Stroke Patients Only)       Balance                                             Pertinent Vitals/Pain Pain Assessment: 0-10 Pain Score: 6  Pain Location: L knee Pain Descriptors / Indicators: Aching;Sore Pain  Intervention(s): Monitored during session;Ice applied;Repositioned    Home Living Family/patient expects to be discharged to:: Private residence Living Arrangements: Children (going to daughter's home) Available Help at Discharge: Family Type of Home: House Home Access: Stairs to enter Entrance Stairs-Rails: Psychiatric nurse of Steps: 5 Home Layout: Two level;Bed/bath upstairs Home Equipment: None      Prior Function Level of Independence: Independent               Hand Dominance        Extremity/Trunk Assessment   Upper Extremity Assessment: Defer to OT evaluation           Lower Extremity Assessment: LLE deficits/detail   LLE Deficits / Details: hip flex 3-/5, moves ankle well  Cervical / Trunk Assessment: Normal  Communication   Communication: No difficulties  Cognition Arousal/Alertness: Awake/alert Behavior During Therapy: WFL for tasks assessed/performed Overall Cognitive Status: Within Functional Limits for tasks assessed                      General Comments      Exercises Total Joint Exercises Ankle Circles/Pumps: AROM;Both;10 reps;Supine Quad Sets: AROM;Both;10 reps;Supine Hip ABduction/ADduction: AAROM;Left;10 reps;Supine Straight Leg Raises: AAROM;Left;10 reps;Supine Goniometric ROM: ~10-50 degrees      Assessment/Plan    PT Assessment Patient needs continued PT services  PT Diagnosis Difficulty walking   PT Problem List Decreased strength;Decreased  range of motion;Decreased activity tolerance;Decreased balance;Decreased mobility;Decreased knowledge of use of DME;Pain  PT Treatment Interventions DME instruction;Gait training;Stair training;Functional mobility training;Therapeutic activities;Patient/family education;Balance training;Therapeutic exercise   PT Goals (Current goals can be found in the Care Plan section) Acute Rehab PT Goals Patient Stated Goal: home PT Goal Formulation: With patient Time For Goal  Achievement: 07/24/15 Potential to Achieve Goals: Good    Frequency 7X/week   Barriers to discharge        Co-evaluation               End of Session Equipment Utilized During Treatment: Gait belt Activity Tolerance: Patient tolerated treatment well Patient left: in chair;with call bell/phone within reach;with chair alarm set           Time: 1030-1101 PT Time Calculation (min) (ACUTE ONLY): 31 min   Charges:   PT Evaluation $PT Eval Low Complexity: 1 Procedure PT Treatments $Gait Training: 8-22 mins   PT G Codes:        Weston Anna, MPT Pager: [phone]

## 2015-07-17 NOTE — Progress Notes (Signed)
   Subjective: 1 Day Post-Op Procedure(s) (LRB): LEFT TOTAL KNEE ARTHROPLASTY (Left) RIGHT KNEE INJECTION (Right) Patient reports pain as moderate pain last night but better today. Patient seen in rounds with Dr. Wynelle Link. Patient is well, but has had some minor complaints of pain in the knee, requiring pain medications We will start therapy today.  Plan is to go Home after hospital stay.  Objective: Vital signs in last 24 hours: Temp:  [97.4 F (36.3 C)-98.3 F (36.8 C)] 97.4 F (36.3 C) (05/09 0555) Pulse Rate:  [49-116] 56 (05/09 0555) Resp:  [11-18] 15 (05/09 0555) BP: (107-156)/(55-92) 141/71 mmHg (05/09 0555) SpO2:  [99 %-100 %] 100 % (05/09 0555) Weight:  [108.41 kg (239 lb)] 108.41 kg (239 lb) (05/08 1003)  Intake/Output from previous day:  Intake/Output Summary (Last 24 hours) at 07/17/15 0841 Last data filed at 07/17/15 0556  Gross per 24 hour  Intake   4095 ml  Output   2255 ml  Net   1840 ml    Intake/Output this shift: UOP 290  Labs:  Recent Labs  07/17/15 0410  HGB 11.7*    Recent Labs  07/17/15 0410  WBC 7.5  RBC 3.66*  HCT 34.0*  PLT 227    Recent Labs  07/17/15 0410  NA 138  K 4.7  CL 107  CO2 25  BUN 13  CREATININE 1.03*  GLUCOSE 157*  CALCIUM 8.6*   No results for input(s): LABPT, INR in the last 72 hours.  EXAM General - Patient is Alert, Appropriate and Oriented Extremity - Neurovascular intact Sensation intact distally Dorsiflexion/Plantar flexion intact Dressing - dressing C/D/I Motor Function - intact, moving foot and toes well on exam.  Hemovac pulled without difficulty.  Past Medical History  Diagnosis Date  . Hypertension   . Thyroid disease   . Hyperlipidemia   . Renal insufficiency   . Back pain   . Renal insufficiency   . Diabetes mellitus     pre diabetes only  . Anxiety   . Hypothyroidism   . Arthritis     ostearthritis. Degenerative disc disease- back  . Rotator cuff disorder     Pain with  limited ROM right, spur left shoulder    Assessment/Plan: 1 Day Post-Op Procedure(s) (LRB): LEFT TOTAL KNEE ARTHROPLASTY (Left) RIGHT KNEE INJECTION (Right) Principal Problem:   OA (osteoarthritis) of knee  Estimated body mass index is 39.77 kg/(m^2) as calculated from the following:   Height as of this encounter: 5\' 5"  (1.651 m).   Weight as of this encounter: 108.41 kg (239 lb). Advance diet Up with therapy Plan for discharge tomorrow Discharge home with home health  DVT Prophylaxis - Xarelto Weight-Bearing as tolerated to left leg D/C O2 and Pulse OX and try on Room Air  Arlee Muslim, PA-C Orthopaedic Surgery 07/17/2015, 8:41 AM

## 2015-07-17 NOTE — Care Management Note (Signed)
Case Management Note  Patient Details  Name: [name] MRN: [id number] Date of Birth: November 15, 1940  Subjective/Objective:    S/p Left Total Knee Arthroplasty, Right knee cortisone injection                Action/Plan: Discharge planning, spoke with patient at bedside. Have chosen [facility] for [facility] PT. Contacted [facility] for referral. Needs RW and 3-n-1, contacted [place] to deliver to room  Expected Discharge Date:  07/18/15               Expected Discharge Plan:  [place]  In-House Referral:  NA  Discharge planning Services  CM Consult  Post Acute Care Choice:  Durable Medical Equipment, Home Health Choice offered to:  Patient  DME Arranged:  3-N-1, Walker rolling DME Agency:  [place]:  PT [facility] Agency:  [place]  Status of Service:  Completed, signed off  Medicare Important Message Given:    Date Medicare IM Given:    Medicare IM give by:    Date Additional Medicare IM Given:    Additional Medicare Important Message give by:     If discussed at [place] of Stay Meetings, dates discussed:    Additional Comments:  Guadalupe Maple, RN 07/17/2015, 11:43 AM [phone]

## 2015-07-18 LAB — BASIC METABOLIC PANEL
ANION GAP: 9 (ref 5–15)
Anion gap: 8 (ref 5–15)
BUN: 26 mg/dL — ABNORMAL HIGH (ref 6–20)
BUN: 27 mg/dL — ABNORMAL HIGH (ref 6–20)
CALCIUM: 8.7 mg/dL — AB (ref 8.9–10.3)
CHLORIDE: 103 mmol/L (ref 101–111)
CHLORIDE: 102 mmol/L (ref 101–111)
CO2: 26 mmol/L (ref 22–32)
CO2: 25 mmol/L (ref 22–32)
CREATININE: 1.45 mg/dL — AB (ref 0.44–1.00)
CREATININE: 1.53 mg/dL — AB (ref 0.44–1.00)
Calcium: 8.7 mg/dL — ABNORMAL LOW (ref 8.9–10.3)
GFR calc non Af Amer: 34 mL/min — ABNORMAL LOW (ref >60)
GFR calc non Af Amer: 32 mL/min — ABNORMAL LOW (ref >60)
GFR, EST AFRICAN AMERICAN: 40 mL/min — AB (ref >60)
GFR, EST AFRICAN AMERICAN: 37 mL/min — AB (ref >60)
GLUCOSE: 137 mg/dL — AB (ref 65–99)
GLUCOSE: 117 mg/dL — AB (ref 65–99)
Potassium: 5.3 mmol/L — ABNORMAL HIGH (ref 3.5–5.1)
Potassium: 4.3 mmol/L (ref 3.5–5.1)
Sodium: 137 mmol/L (ref 135–145)
Sodium: 136 mmol/L (ref 135–145)

## 2015-07-18 LAB — CBC
HEMATOCRIT: 32.2 % — AB (ref 36.0–46.0)
HEMOGLOBIN: 11 g/dL — AB (ref 12.0–15.0)
MCH: 31.9 pg (ref 26.0–34.0)
MCHC: 34.2 g/dL (ref 30.0–36.0)
MCV: 93.3 fL (ref 78.0–100.0)
Platelets: 230 10*3/uL (ref 150–400)
RBC: 3.45 MIL/uL — ABNORMAL LOW (ref 3.87–5.11)
RDW: 14.2 % (ref 11.5–15.5)
WBC: 13.4 10*3/uL — ABNORMAL HIGH (ref 4.0–10.5)

## 2015-07-18 MED FILL — XARELTO 10 MG TABLET: 10 | 19 days supply | Qty: 19 | Fill #0

## 2015-07-18 MED FILL — traMADol HCL 50 MG TABS: 50 | 11 days supply | Qty: 90 | Fill #0

## 2015-07-18 MED FILL — oxyCODONE HCL 5 MG TABS: 5 | 5 days supply | Qty: 90 | Fill #0

## 2015-07-18 MED FILL — METHOCARBAMOL 500 MG TABLET: 500 | 22 days supply | Qty: 90 | Fill #0

## 2015-07-18 NOTE — Telephone Encounter (Signed)
Left message for Sheila to return call.  

## 2015-07-18 NOTE — Progress Notes (Signed)
   Subjective: 2 Days Post-Op Procedure(s) (LRB): LEFT TOTAL KNEE ARTHROPLASTY (Left) RIGHT KNEE INJECTION (Right) Patient reports pain as mild.   Patient seen in rounds with Dr. Wynelle Link. Sitting up in chair. Patient is well, but has had some minor complaints of pain in the knee, requiring pain medications Plan is to go Home after hospital stay. Creat came up a little but near preop level.  Will recheck again today to ensure that it is stable before discharge. If stable and meets goals, then home later today.  Objective: Vital signs in last 24 hours: Temp:  [97.6 F (36.4 C)-98.3 F (36.8 C)] 97.7 F (36.5 C) (05/10 0431) Pulse Rate:  [54-66] 66 (05/10 0431) Resp:  [16-18] 16 (05/10 0431) BP: (123-132)/(68-75) 123/68 mmHg (05/10 0431) SpO2:  [97 %-99 %] 99 % (05/10 0431)  Intake/Output from previous day:  Intake/Output Summary (Last 24 hours) at 07/18/15 0806 Last data filed at 07/18/15 0742  Gross per 24 hour  Intake   1776 ml  Output   1050 ml  Net    726 ml    Intake/Output this shift: Total I/O In: 240 [P.O.:240] Out: -   Labs:  Recent Labs  07/17/15 0410 07/18/15 0412  HGB 11.7* 11.0*    Recent Labs  07/17/15 0410 07/18/15 0412  WBC 7.5 13.4*  RBC 3.66* 3.45*  HCT 34.0* 32.2*  PLT 227 230    Recent Labs  07/17/15 0410 07/18/15 0412  NA 138 137  K 4.7 5.3*  CL 107 103  CO2 25 26  BUN 13 26*  CREATININE 1.03* 1.45*  GLUCOSE 157* 137*  CALCIUM 8.6* 8.7*   No results for input(s): LABPT, INR in the last 72 hours.  EXAM General - Patient is Alert, Appropriate and Oriented Extremity - Neurovascular intact Sensation intact distally Dorsiflexion/Plantar flexion intact Dressing/Incision - clean, dry, no drainage, looks excellent Motor Function - intact, moving foot and toes well on exam.   Past Medical History  Diagnosis Date  . Hypertension   . Thyroid disease   . Hyperlipidemia   . Renal insufficiency   . Back pain   . Renal  insufficiency   . Diabetes mellitus     pre diabetes only  . Anxiety   . Hypothyroidism   . Arthritis     ostearthritis. Degenerative disc disease- back  . Rotator cuff disorder     Pain with limited ROM right, spur left shoulder    Assessment/Plan: 2 Days Post-Op Procedure(s) (LRB): LEFT TOTAL KNEE ARTHROPLASTY (Left) RIGHT KNEE INJECTION (Right) Principal Problem:   OA (osteoarthritis) of knee  Estimated body mass index is 39.77 kg/(m^2) as calculated from the following:   Height as of this encounter: 5\' 5"  (1.651 m).   Weight as of this encounter: 108.41 kg (239 lb). Up with therapy Discharge home with home health  DVT Prophylaxis - Xarelto Weight-Bearing as tolerated to left leg DC home if creat better ane meets goals Dc meds - see summary WBAT Diet - Renal, Diabetic Diet F/U in 2 weeks on Tuesday 5/23  Arlee Muslim, PA-C Orthopaedic Surgery 07/18/2015, 8:06 AM

## 2015-07-18 NOTE — Progress Notes (Signed)
Pt to d/c home with [facility]. DME (3n1 & RW) delivered to room prior to d/c . AVS reviewed and "My Chart" discussed with pt. Pt capable of verbalizing medications, dressing changes, signs and symptoms of infection, and follow-up appointments. Remains hemodynamically stable. No signs and symptoms of distress. Educated pt to return to ER in the case of SOB, dizziness, or chest pain.

## 2015-07-18 NOTE — Progress Notes (Signed)
Physical Therapy Treatment Patient Details Name: [name] MRN: [id number] DOB: 1940/10/13 Today's Date: 07/18/2015    History of Present Illness 75 yo female s/p L TKA, R knee cortison injection 07/16/15    PT Comments    Reviewed/praticed ambulation and stair training. All education completed. Ready to d/c from PT standpoint.   Follow Up Recommendations  Home health PT;Supervision/Assistance - 24 hour     Equipment Recommendations  Rolling walker with 5" wheels    Recommendations for Other Services       Precautions / Restrictions Precautions Precautions: Knee;Fall Required Braces or Orthoses: Knee Immobilizer - Left Knee Immobilizer - Left: Discontinue once straight leg raise with < 10 degree lag (able to SLR 5/10) Restrictions Weight Bearing Restrictions: No LLE Weight Bearing: Weight bearing as tolerated    Mobility  Bed Mobility               General bed mobility comments: oob in recliner  Transfers Overall transfer level: Needs assistance Equipment used: Rolling walker (2 wheeled) Transfers: Sit to/from Stand Sit to Stand: Min guard         General transfer comment: close guard for safety. VCs hand placement. Increased time to rise.  Ambulation/Gait Ambulation/Gait assistance: Min guard Ambulation Distance (Feet): 75 Feet Assistive device: Rolling walker (2 wheeled) Gait Pattern/deviations: Step-to pattern;Step-through pattern     General Gait Details: slow gait speed. close guard for safety   Stairs Stairs: Yes Stairs assistance: Min assist Stair Management: One rail Right;Step to pattern;Forwards Number of Stairs: 2 General stair comments: VCs safety, technique, sequence. small amount of assist to stabilize.   Wheelchair Mobility    Modified Rankin (Stroke Patients Only)       Balance                                    Cognition Arousal/Alertness: Awake/alert Behavior During Therapy: WFL for tasks  assessed/performed Overall Cognitive Status: Within Functional Limits for tasks assessed                      Exercises      General Comments        Pertinent Vitals/Pain Pain Assessment: 0-10 Pain Score: 5  Pain Location: L knee Pain Descriptors / Indicators: Aching;Sore Pain Intervention(s): Monitored during session;Repositioned;Ice applied    Home Living                      Prior Function            PT Goals (current goals can now be found in the care plan section) Progress towards PT goals: Progressing toward goals    Frequency  7X/week    PT Plan Current plan remains appropriate    Co-evaluation             End of Session Equipment Utilized During Treatment: Gait belt Activity Tolerance: Patient tolerated treatment well Patient left: in chair;with call bell/phone within reach;with chair alarm set     Time: 1429-1445 PT Time Calculation (min) (ACUTE ONLY): 16 min  Charges:  $Gait Training: 8-22 mins                    G Codes:      Weston Anna, MPT Pager: [phone]

## 2015-07-18 NOTE — Progress Notes (Signed)
Physical Therapy Treatment Patient Details Name: [name] MRN: [id number] DOB: 1941-02-13 Today's Date: 07/18/2015    History of Present Illness 75 yo female s/p L TKA, R knee cortison injection 07/16/15    PT Comments    Progressing well with mobility. Will have a 2nd session to practice steps later today prior to d/c.   Follow Up Recommendations  Home health PT;Supervision/Assistance - 24 hour     Equipment Recommendations  Rolling walker with 5" wheels    Recommendations for Other Services       Precautions / Restrictions Precautions Precautions: Knee;Fall Required Braces or Orthoses: Knee Immobilizer - Left Knee Immobilizer - Left: Discontinue once straight leg raise with < 10 degree lag (pt able to SLR 5/10) Restrictions Weight Bearing Restrictions: No LLE Weight Bearing: Weight bearing as tolerated    Mobility  Bed Mobility               General bed mobility comments: oob in recliner  Transfers Overall transfer level: Needs assistance Equipment used: Rolling walker (2 wheeled) Transfers: Sit to/from Stand Sit to Stand: Min guard         General transfer comment: close guard for safety. VCs hand placement. Increased time to rise.  Ambulation/Gait Ambulation/Gait assistance: Min guard Ambulation Distance (Feet): 90 Feet Assistive device: Rolling walker (2 wheeled) Gait Pattern/deviations: Step-to pattern;Step-through pattern     General Gait Details: slow gait speed. close guard for safety   Stairs            Wheelchair Mobility    Modified Rankin (Stroke Patients Only)       Balance                                    Cognition Arousal/Alertness: Awake/alert Behavior During Therapy: WFL for tasks assessed/performed Overall Cognitive Status: Within Functional Limits for tasks assessed                      Exercises Total Joint Exercises Ankle Circles/Pumps: AROM;Both;10 reps;Supine Quad Sets:  AROM;Both;10 reps;Supine Hip ABduction/ADduction: AAROM;Left;10 reps;Supine Straight Leg Raises: AAROM;Left;10 reps;Supine Knee Flexion: AAROM;Left;10 reps Goniometric ROM: ~5-80 degrees    General Comments        Pertinent Vitals/Pain Pain Assessment: 0-10 Pain Score: 5  Pain Location: L knee Pain Descriptors / Indicators: Aching;Sore Pain Intervention(s): Monitored during session;Ice applied    Home Living                      Prior Function            PT Goals (current goals can now be found in the care plan section) Progress towards PT goals: Progressing toward goals    Frequency  7X/week    PT Plan Current plan remains appropriate    Co-evaluation             End of Session   Activity Tolerance: Patient tolerated treatment well Patient left: in chair;with call bell/phone within reach;with chair alarm set     Time: [id number] PT Time Calculation (min) (ACUTE ONLY): 22 min  Charges:  $Gait Training: 8-22 mins                    G Codes:      Weston Anna, MPT Pager: [phone]

## 2015-07-18 NOTE — Progress Notes (Signed)
[facility]    [facility] is providing the following services: rw and 3n1   If patient discharges after hours, please call [phone].   Linward Headland 07/18/2015, 10:29 AM

## 2015-07-19 DIAGNOSIS — Z471 Aftercare following joint replacement surgery: Secondary | ICD-10-CM | POA: Diagnosis not present

## 2015-07-19 DIAGNOSIS — M1990 Unspecified osteoarthritis, unspecified site: Secondary | ICD-10-CM | POA: Diagnosis not present

## 2015-07-19 DIAGNOSIS — M519 Unspecified thoracic, thoracolumbar and lumbosacral intervertebral disc disorder: Secondary | ICD-10-CM | POA: Diagnosis not present

## 2015-07-19 DIAGNOSIS — I129 Hypertensive chronic kidney disease with stage 1 through stage 4 chronic kidney disease, or unspecified chronic kidney disease: Secondary | ICD-10-CM | POA: Diagnosis not present

## 2015-07-19 DIAGNOSIS — E119 Type 2 diabetes mellitus without complications: Secondary | ICD-10-CM | POA: Diagnosis not present

## 2015-07-19 DIAGNOSIS — N189 Chronic kidney disease, unspecified: Secondary | ICD-10-CM | POA: Diagnosis not present

## 2015-07-19 NOTE — Telephone Encounter (Signed)
Daughter notified per Sharyn Lull

## 2015-07-20 DIAGNOSIS — E119 Type 2 diabetes mellitus without complications: Secondary | ICD-10-CM | POA: Diagnosis not present

## 2015-07-20 DIAGNOSIS — N189 Chronic kidney disease, unspecified: Secondary | ICD-10-CM | POA: Diagnosis not present

## 2015-07-20 DIAGNOSIS — I129 Hypertensive chronic kidney disease with stage 1 through stage 4 chronic kidney disease, or unspecified chronic kidney disease: Secondary | ICD-10-CM | POA: Diagnosis not present

## 2015-07-20 DIAGNOSIS — M519 Unspecified thoracic, thoracolumbar and lumbosacral intervertebral disc disorder: Secondary | ICD-10-CM | POA: Diagnosis not present

## 2015-07-20 DIAGNOSIS — Z471 Aftercare following joint replacement surgery: Secondary | ICD-10-CM | POA: Diagnosis not present

## 2015-07-20 DIAGNOSIS — M1990 Unspecified osteoarthritis, unspecified site: Secondary | ICD-10-CM | POA: Diagnosis not present

## 2015-07-23 DIAGNOSIS — I129 Hypertensive chronic kidney disease with stage 1 through stage 4 chronic kidney disease, or unspecified chronic kidney disease: Secondary | ICD-10-CM | POA: Diagnosis not present

## 2015-07-23 DIAGNOSIS — E119 Type 2 diabetes mellitus without complications: Secondary | ICD-10-CM | POA: Diagnosis not present

## 2015-07-23 DIAGNOSIS — Z471 Aftercare following joint replacement surgery: Secondary | ICD-10-CM | POA: Diagnosis not present

## 2015-07-23 DIAGNOSIS — M519 Unspecified thoracic, thoracolumbar and lumbosacral intervertebral disc disorder: Secondary | ICD-10-CM | POA: Diagnosis not present

## 2015-07-23 DIAGNOSIS — M1990 Unspecified osteoarthritis, unspecified site: Secondary | ICD-10-CM | POA: Diagnosis not present

## 2015-07-23 DIAGNOSIS — N189 Chronic kidney disease, unspecified: Secondary | ICD-10-CM | POA: Diagnosis not present

## 2015-07-25 DIAGNOSIS — N189 Chronic kidney disease, unspecified: Secondary | ICD-10-CM | POA: Diagnosis not present

## 2015-07-25 DIAGNOSIS — Z471 Aftercare following joint replacement surgery: Secondary | ICD-10-CM | POA: Diagnosis not present

## 2015-07-25 DIAGNOSIS — M519 Unspecified thoracic, thoracolumbar and lumbosacral intervertebral disc disorder: Secondary | ICD-10-CM | POA: Diagnosis not present

## 2015-07-25 DIAGNOSIS — M1990 Unspecified osteoarthritis, unspecified site: Secondary | ICD-10-CM | POA: Diagnosis not present

## 2015-07-25 DIAGNOSIS — I129 Hypertensive chronic kidney disease with stage 1 through stage 4 chronic kidney disease, or unspecified chronic kidney disease: Secondary | ICD-10-CM | POA: Diagnosis not present

## 2015-07-25 DIAGNOSIS — E119 Type 2 diabetes mellitus without complications: Secondary | ICD-10-CM | POA: Diagnosis not present

## 2015-07-27 DIAGNOSIS — M1990 Unspecified osteoarthritis, unspecified site: Secondary | ICD-10-CM | POA: Diagnosis not present

## 2015-07-27 DIAGNOSIS — Z471 Aftercare following joint replacement surgery: Secondary | ICD-10-CM | POA: Diagnosis not present

## 2015-07-27 DIAGNOSIS — I129 Hypertensive chronic kidney disease with stage 1 through stage 4 chronic kidney disease, or unspecified chronic kidney disease: Secondary | ICD-10-CM | POA: Diagnosis not present

## 2015-07-27 DIAGNOSIS — N189 Chronic kidney disease, unspecified: Secondary | ICD-10-CM | POA: Diagnosis not present

## 2015-07-27 DIAGNOSIS — E119 Type 2 diabetes mellitus without complications: Secondary | ICD-10-CM | POA: Diagnosis not present

## 2015-07-27 DIAGNOSIS — M519 Unspecified thoracic, thoracolumbar and lumbosacral intervertebral disc disorder: Secondary | ICD-10-CM | POA: Diagnosis not present

## 2015-07-31 DIAGNOSIS — Z471 Aftercare following joint replacement surgery: Secondary | ICD-10-CM | POA: Diagnosis not present

## 2015-07-31 DIAGNOSIS — Z96652 Presence of left artificial knee joint: Secondary | ICD-10-CM | POA: Diagnosis not present

## 2015-07-31 MED FILL — traMADol HCL 50 MG TABS: 50 | 10 days supply | Qty: 80 | Fill #0

## 2015-08-06 DIAGNOSIS — M6281 Muscle weakness (generalized): Secondary | ICD-10-CM | POA: Diagnosis not present

## 2015-08-09 DIAGNOSIS — Z471 Aftercare following joint replacement surgery: Secondary | ICD-10-CM | POA: Diagnosis not present

## 2015-08-09 DIAGNOSIS — M6281 Muscle weakness (generalized): Secondary | ICD-10-CM | POA: Diagnosis not present

## 2015-08-14 DIAGNOSIS — M6281 Muscle weakness (generalized): Secondary | ICD-10-CM | POA: Diagnosis not present

## 2015-08-14 DIAGNOSIS — Z471 Aftercare following joint replacement surgery: Secondary | ICD-10-CM | POA: Diagnosis not present

## 2015-08-16 DIAGNOSIS — M6281 Muscle weakness (generalized): Secondary | ICD-10-CM | POA: Diagnosis not present

## 2015-08-16 DIAGNOSIS — Z471 Aftercare following joint replacement surgery: Secondary | ICD-10-CM | POA: Diagnosis not present

## 2015-08-21 DIAGNOSIS — M6281 Muscle weakness (generalized): Secondary | ICD-10-CM | POA: Diagnosis not present

## 2015-08-21 DIAGNOSIS — Z471 Aftercare following joint replacement surgery: Secondary | ICD-10-CM | POA: Diagnosis not present

## 2015-08-23 DIAGNOSIS — M6281 Muscle weakness (generalized): Secondary | ICD-10-CM | POA: Diagnosis not present

## 2015-08-23 DIAGNOSIS — Z471 Aftercare following joint replacement surgery: Secondary | ICD-10-CM | POA: Diagnosis not present

## 2015-08-30 DIAGNOSIS — Z471 Aftercare following joint replacement surgery: Secondary | ICD-10-CM | POA: Diagnosis not present

## 2015-08-30 DIAGNOSIS — M6281 Muscle weakness (generalized): Secondary | ICD-10-CM | POA: Diagnosis not present

## 2015-08-31 DIAGNOSIS — Z96652 Presence of left artificial knee joint: Secondary | ICD-10-CM | POA: Diagnosis not present

## 2015-08-31 DIAGNOSIS — Z471 Aftercare following joint replacement surgery: Secondary | ICD-10-CM | POA: Diagnosis not present

## 2015-09-04 DIAGNOSIS — Z471 Aftercare following joint replacement surgery: Secondary | ICD-10-CM | POA: Diagnosis not present

## 2015-09-04 DIAGNOSIS — M6281 Muscle weakness (generalized): Secondary | ICD-10-CM | POA: Diagnosis not present

## 2015-09-06 DIAGNOSIS — Z471 Aftercare following joint replacement surgery: Secondary | ICD-10-CM | POA: Diagnosis not present

## 2015-09-06 DIAGNOSIS — M6281 Muscle weakness (generalized): Secondary | ICD-10-CM | POA: Diagnosis not present

## 2015-09-07 ENCOUNTER — Other Ambulatory Visit: Payer: Self-pay

## 2015-09-07 MED ORDER — AMLODIPINE BESYLATE 5 MG PO TABS: 5.0000 mg | ORAL_TABLET | Freq: Every day | ORAL | Status: DC

## 2015-09-11 DIAGNOSIS — M6281 Muscle weakness (generalized): Secondary | ICD-10-CM | POA: Diagnosis not present

## 2015-09-11 DIAGNOSIS — Z471 Aftercare following joint replacement surgery: Secondary | ICD-10-CM | POA: Diagnosis not present

## 2015-09-13 DIAGNOSIS — Z471 Aftercare following joint replacement surgery: Secondary | ICD-10-CM | POA: Diagnosis not present

## 2015-09-13 DIAGNOSIS — M6281 Muscle weakness (generalized): Secondary | ICD-10-CM | POA: Diagnosis not present

## 2015-10-04 DIAGNOSIS — Z96652 Presence of left artificial knee joint: Secondary | ICD-10-CM | POA: Diagnosis not present

## 2015-10-04 DIAGNOSIS — Z471 Aftercare following joint replacement surgery: Secondary | ICD-10-CM | POA: Diagnosis not present

## 2015-11-13 ENCOUNTER — Other Ambulatory Visit: Payer: Self-pay

## 2015-12-07 ENCOUNTER — Telehealth: Payer: Self-pay | Admitting: *Deleted

## 2015-12-07 NOTE — Telephone Encounter (Signed)
Simvastatin refilled. 

## 2015-12-24 ENCOUNTER — Encounter: Payer: Self-pay | Admitting: Internal Medicine

## 2015-12-24 ENCOUNTER — Ambulatory Visit (INDEPENDENT_AMBULATORY_CARE_PROVIDER_SITE_OTHER): Payer: Medicare Other | Admitting: Internal Medicine

## 2015-12-24 VITALS — BP 130/86 | HR 70 | Temp 97.2°F | Ht 63.5 in | Wt 229.0 lb

## 2015-12-24 DIAGNOSIS — Z23 Encounter for immunization: Secondary | ICD-10-CM

## 2015-12-24 DIAGNOSIS — L304 Erythema intertrigo: Secondary | ICD-10-CM

## 2015-12-24 DIAGNOSIS — G8929 Other chronic pain: Secondary | ICD-10-CM

## 2015-12-24 DIAGNOSIS — E8881 Metabolic syndrome: Secondary | ICD-10-CM

## 2015-12-24 DIAGNOSIS — R319 Hematuria, unspecified: Secondary | ICD-10-CM | POA: Diagnosis not present

## 2015-12-24 DIAGNOSIS — E039 Hypothyroidism, unspecified: Secondary | ICD-10-CM | POA: Diagnosis not present

## 2015-12-24 DIAGNOSIS — E785 Hyperlipidemia, unspecified: Secondary | ICD-10-CM | POA: Diagnosis not present

## 2015-12-24 DIAGNOSIS — M25562 Pain in left knee: Secondary | ICD-10-CM | POA: Diagnosis not present

## 2015-12-24 DIAGNOSIS — R7302 Impaired glucose tolerance (oral): Secondary | ICD-10-CM

## 2015-12-24 DIAGNOSIS — M25561 Pain in right knee: Secondary | ICD-10-CM | POA: Diagnosis not present

## 2015-12-24 DIAGNOSIS — I159 Secondary hypertension, unspecified: Secondary | ICD-10-CM | POA: Diagnosis not present

## 2015-12-24 DIAGNOSIS — N183 Chronic kidney disease, stage 3 unspecified: Secondary | ICD-10-CM

## 2015-12-24 DIAGNOSIS — M1990 Unspecified osteoarthritis, unspecified site: Secondary | ICD-10-CM

## 2015-12-24 DIAGNOSIS — Z0000 Encounter for general adult medical examination without abnormal findings: Secondary | ICD-10-CM

## 2015-12-24 LAB — POCT URINALYSIS DIPSTICK
Bilirubin, UA: NEGATIVE
GLUCOSE UA: NEGATIVE
Ketones, UA: NEGATIVE
NITRITE UA: NEGATIVE
Spec Grav, UA: 1.01
UROBILINOGEN UA: NEGATIVE
pH, UA: 5

## 2015-12-24 LAB — CBC WITH DIFFERENTIAL/PLATELET
BASOS PCT: 1 %
Basophils Absolute: 38 cells/uL (ref 0–200)
EOS ABS: 38 {cells}/uL (ref 15–500)
Eosinophils Relative: 1 %
HEMATOCRIT: 38.5 % (ref 35.0–45.0)
HEMOGLOBIN: 12.5 g/dL (ref 11.7–15.5)
LYMPHS ABS: 1444 {cells}/uL (ref 850–3900)
Lymphocytes Relative: 38 %
MCH: 30.8 pg (ref 27.0–33.0)
MCHC: 32.5 g/dL (ref 32.0–36.0)
MCV: 94.8 fL (ref 80.0–100.0)
MONO ABS: 456 {cells}/uL (ref 200–950)
MPV: 9.4 fL (ref 7.5–12.5)
Monocytes Relative: 12 %
NEUTROS PCT: 48 %
Neutro Abs: 1824 cells/uL (ref 1500–7800)
Platelets: 240 10*3/uL (ref 140–400)
RBC: 4.06 MIL/uL (ref 3.80–5.10)
RDW: 13.8 % (ref 11.0–15.0)
WBC: 3.8 10*3/uL (ref 3.8–10.8)

## 2015-12-24 LAB — COMPREHENSIVE METABOLIC PANEL
ALBUMIN: 3.9 g/dL (ref 3.6–5.1)
ALK PHOS: 61 U/L (ref 33–130)
ALT: 7 U/L (ref 6–29)
AST: 18 U/L (ref 10–35)
BILIRUBIN TOTAL: 0.5 mg/dL (ref 0.2–1.2)
BUN: 19 mg/dL (ref 7–25)
CALCIUM: 9.5 mg/dL (ref 8.6–10.4)
CO2: 27 mmol/L (ref 20–31)
Chloride: 102 mmol/L (ref 98–110)
Creat: 1.28 mg/dL — ABNORMAL HIGH (ref 0.60–0.93)
Glucose, Bld: 93 mg/dL (ref 65–99)
Potassium: 3.9 mmol/L (ref 3.5–5.3)
Sodium: 140 mmol/L (ref 135–146)
Total Protein: 8 g/dL (ref 6.1–8.1)

## 2015-12-24 LAB — LIPID PANEL
CHOLESTEROL: 183 mg/dL (ref 125–200)
HDL: 56 mg/dL (ref >=46)
LDL Cholesterol: 102 mg/dL (ref <130)
TRIGLYCERIDES: 127 mg/dL (ref <150)
Total CHOL/HDL Ratio: 3.3 Ratio (ref <=5.0)
VLDL: 25 mg/dL (ref <30)

## 2015-12-24 LAB — TSH: TSH: 2.11 m[IU]/L

## 2015-12-24 MED ORDER — KETOCONAZOLE 2 % EX CREA: TOPICAL_CREAM | CUTANEOUS | 99 refills | Status: DC

## 2015-12-24 MED ORDER — HYDROCODONE-ACETAMINOPHEN 5-325 MG PO TABS: 1.0000 | ORAL_TABLET | Freq: Four times a day (QID) | ORAL | 0 refills | Status: DC | PRN

## 2015-12-24 NOTE — Progress Notes (Signed)
Subjective:    Patient ID: [name], female    DOB: 16-Jul-1940, 75 y.o.   MRN: [id number]  HPI 75 year old Female in today for health maintenance exam and evaluation of medical issues. She has a history of bilateral knee pain. Status post left knee replacement 5 months ago. Still has pain and swelling and uses hydrocodone/APAP 5/325 sparingly. This was refilled today.  She did not give clean catch urine. Urine specimen was abnormal. It will be cultured.  Has not had recent eye exam. Was reminded to do so.  Flu vaccine given. Needs to have mammogram in the near future.  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  Diabetes is diet controlled at the present time.  No history of serious illnesses accidents or operations before I send her care in 2009.  She had right hip replacement in 2010.  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 Resides alone. Does not smoke or consume alcohol. She owns her own home, continues to drive and does her own housework. She is married and completed high school. Husband is in a nursing home here in [place] with history of diabetes status post leg amputation. Daughter resides here as well.  Family history: Father died at age 4 of prostate cancer. Mother died at age 41 of stroke with history of hypertension. One brother with history of lung cancer.  She also has a history of chronic kidney disease which is being observed.      Review of Systems Says  left knee swells and hurts. Complaining of intertrigo under right breast. She has to take some hydrocodone/APAP sparingly for knee pain. No UTI symptoms     Objective:   Physical Exam  Constitutional: She is  oriented to person, place, and time. She appears well-developed and well-nourished. No distress.  HENT:  Head: Normocephalic and atraumatic.  Right Ear: External ear normal.  Left Ear: External ear normal.  Mouth/Throat: Oropharynx is clear and moist.  Eyes: Conjunctivae are normal. Pupils are equal, round, and reactive to light. Right eye exhibits no discharge. Left eye exhibits no discharge. No scleral icterus.  Neck: Neck supple. No JVD present. No thyromegaly present.  Cardiovascular: Normal rate, regular rhythm, normal heart sounds and intact distal pulses.   No murmur heard. Pulmonary/Chest: No respiratory distress. She has no wheezes. She has no rales.  Abdominal: Soft. Bowel sounds are normal. She exhibits no distension and no mass. There is no tenderness. There is no rebound and no guarding.  Genitourinary:  Genitourinary Comments: Bimanual normal  Musculoskeletal: She exhibits no edema.  Left knee swollen. Bilateral crepitus  Neurological: She is alert and oriented to person, place, and time. No cranial nerve deficit. Coordination normal.  Skin: Skin is warm and dry. She is not diaphoretic.  Intertrigo under right breast  Psychiatric: She has a normal mood and affect. Her behavior is normal. Judgment and thought content normal.  Vitals reviewed.         Assessment & Plan:  Health maintenance exam  Fibrocystic breast disease-order for mammogram provided April 2017. Patient reminded to get mammogram  Flu vaccine given  Essential hypertension-Stable on current regimen  Hyperlipidemia-Lipid panel liver functions are entirely within normal limits  Hypothyroidism-TSH stable on current dose of thyroid replacement  Metabolic syndrome  Obesity  Chronic kidney disease-monitoring. Creatinine stable at 1.28 and 5 months ago was 1.53  Intertrigo under breast-Nizoral cream prescribed   Abnormal urine specimen-culture pending  Diabetes mellitus-Diet controlled lab work  pending  Bilateral knee pain  Status post left knee arthroplasty-refill hydrocodone/APAP  Plan: Continue same medications and return in 6 months. Nizoral cream for intertrigo. Hydrocodone/APAP refilled. Urine culture pending. Subjective:   Patient presents for Medicare Annual/Subsequent preventive examination.  Review Past Medical/Family/Social:See above   Risk Factors  Current exercise habits: Sedentary Dietary issues discussed: Low fat low carbohydrate  Cardiac risk factors:Hyperlipidemia, hypertension, impaired glucose tolerance  Depression Screen  (Note: if answer to either of the following is "Yes", a more complete depression screening is indicated)   Over the past two weeks, have you felt down, depressed or hopeless? No  Over the past two weeks, have you felt little interest or pleasure in doing things? No Have you lost interest or pleasure in daily life? No Do you often feel hopeless? No Do you cry easily over simple problems? No   Activities of Daily Living  In your present state of health, do you have any difficulty performing the following activities?:   Driving? No  Managing money? No  Feeding yourself? No  Getting from bed to chair? No  Climbing a flight of stairs? No  Preparing food and eating?: No  Bathing or showering? No  Getting dressed: No  Getting to the toilet? No  Using the toilet:No  Moving around from place to place: No  In the past year have you fallen or had a near fall?:No  Are you sexually active? No  Do you have more than one partner? No   Hearing Difficulties: No  Do you often ask people to speak up or repeat themselves? Sometimes Do you experience ringing or noises in your ears? No  Do you have difficulty understanding soft or whispered voices? Yes Do you feel that you have a problem with memory? No Do you often misplace items? No    Home Safety:  Do you have a smoke alarm at your residence? Yes Do you have grab bars in the  bathroom?No Do you have throw rugs in your house?Yes   Cognitive Testing  Alert? Yes Normal Appearance?Yes  Oriented to person? Yes Place? Yes  Time? Yes  Recall of three objects? Yes  Can perform simple calculations? Yes  Displays appropriate judgment?Yes  Can read the correct time from a watch face?Yes   List the Names of Other Physician/Practitioners you currently use:  See referral list for the physicians patient is currently seeing.  Currently not seeing any specialists. Needs to have diabetic eye exam Review of Systems: See above   Objective:     General appearance: Appears stated age and  obese  Head: Normocephalic, without obvious abnormality, atraumatic  Eyes: conj clear, EOMi PEERLA  Ears: normal TM's and external ear canals both ears  Nose: Nares normal. Septum midline. Mucosa normal. No drainage or sinus tenderness.  Throat: lips, mucosa, and tongue normal; teeth and gums normal  Neck: no adenopathy, no carotid bruit, no JVD, supple, symmetrical, trachea midline and thyroid not enlarged, symmetric, no tenderness/mass/nodules  No CVA tenderness.  Lungs: clear to auscultation bilaterally  Breasts: normal appearance-Fibrocystic changes noted. Heart: regular rate and rhythm, S1, S2 normal, no murmur, click, rub or gallop  Abdomen: soft, non-tender; bowel sounds normal; no masses, no organomegaly  Musculoskeletal: ROM normal in all joints, no crepitus, no deformity, Normal muscle strengthen.  Back  is symmetric, no curvature. Skin: Skin color, texture, turgor normal. No rashes or lesions  Lymph nodes: Cervical, supraclavicular, and axillary nodes normal.  Neurologic: CN 2 -12 Normal, Normal symmetric reflexes. Normal coordination and gait  Psych: Alert & Oriented x 3, Mood appear stable.    Assessment:    Annual wellness medicare exam   Plan:    During the course of the visit the patient was educated and counseled about appropriate screening and preventive  services including:  Annual mammogram  3 Hemoccult cards  Immunizations up-to-date      Patient Instructions (the written plan) was given to the patient.  Medicare Attestation  I have personally reviewed:  The patient's medical and social history  Their use of alcohol, tobacco or illicit drugs  Their current medications and supplements  The patient's functional ability including ADLs,fall risks, home safety risks, cognitive, and hearing and visual impairment  Diet and physical activities  Evidence for depression or mood disorders  The patient's weight, height, BMI, and visual acuity have been recorded in the chart. I have made referrals, counseling, and provided education to the patient based on review of the above and I have provided the patient with a written personalized care plan for preventive services.

## 2015-12-24 NOTE — Patient Instructions (Signed)
Hydrocodone/APAP refilled. #30 tablets given. Return in 6 months or as needed. Continue same medications. Fasting labs drawn and pending. Flu vaccine given today.

## 2015-12-24 NOTE — Progress Notes (Signed)
   Subjective:    Patient ID: [name], female    DOB: March 01, 1941, 75 y.o.   MRN: [id number]  HPI     Review of Systems     Objective:   Physical Exam        Assessment & Plan:

## 2015-12-25 LAB — MICROALBUMIN, URINE: MICROALB UR: 0.4 mg/dL

## 2015-12-25 LAB — URINE CULTURE

## 2015-12-25 LAB — VITAMIN D 25 HYDROXY (VIT D DEFICIENCY, FRACTURES): Vit D, 25-Hydroxy: 30 ng/mL (ref 30–100)

## 2015-12-31 LAB — HEMOCCULT GUIAC POC 1CARD (OFFICE)
Card #2 Fecal Occult Blod, POC: NEGATIVE
FECAL OCCULT BLD: NEGATIVE
Fecal Occult Blood, POC: NEGATIVE

## 2015-12-31 NOTE — Addendum Note (Signed)
Addended by: Tamera Punt C on: 12/31/2015 03:26 PM   Modules accepted: Orders

## 2016-01-18 DIAGNOSIS — Z471 Aftercare following joint replacement surgery: Secondary | ICD-10-CM | POA: Diagnosis not present

## 2016-01-18 DIAGNOSIS — M1711 Unilateral primary osteoarthritis, right knee: Secondary | ICD-10-CM | POA: Diagnosis not present

## 2016-01-18 DIAGNOSIS — Z96652 Presence of left artificial knee joint: Secondary | ICD-10-CM | POA: Diagnosis not present

## 2016-03-11 ENCOUNTER — Other Ambulatory Visit: Payer: Self-pay | Admitting: Internal Medicine

## 2016-03-11 MED ORDER — POTASSIUM CHLORIDE ER 10 MEQ PO TBCR: 10.0000 meq | EXTENDED_RELEASE_TABLET | Freq: Every day | ORAL | 3 refills | Status: DC

## 2016-04-17 DIAGNOSIS — M25512 Pain in left shoulder: Secondary | ICD-10-CM | POA: Diagnosis not present

## 2016-04-17 DIAGNOSIS — G8929 Other chronic pain: Secondary | ICD-10-CM | POA: Diagnosis not present

## 2016-04-17 DIAGNOSIS — M25511 Pain in right shoulder: Secondary | ICD-10-CM | POA: Diagnosis not present

## 2016-04-18 ENCOUNTER — Other Ambulatory Visit: Payer: Self-pay

## 2016-04-18 MED ORDER — METOPROLOL SUCCINATE ER 25 MG PO TB24: 25.0000 mg | ORAL_TABLET | Freq: Every day | ORAL | 3 refills | Status: DC

## 2016-06-06 ENCOUNTER — Telehealth: Payer: Self-pay | Admitting: Internal Medicine

## 2016-06-06 MED ORDER — AMLODIPINE BESYLATE 5 MG PO TABS: 5.0000 mg | ORAL_TABLET | Freq: Every day | ORAL | 3 refills | Status: DC

## 2016-06-06 MED ORDER — FUROSEMIDE 40 MG PO TABS: 40.0000 mg | ORAL_TABLET | Freq: Every day | ORAL | 3 refills | Status: DC

## 2016-06-06 MED ORDER — LEVOTHYROXINE SODIUM 75 MCG PO TABS: 75.0000 ug | ORAL_TABLET | Freq: Every day | ORAL | 3 refills | Status: DC

## 2016-06-06 MED ORDER — METOPROLOL SUCCINATE ER 25 MG PO TB24: 25.0000 mg | ORAL_TABLET | Freq: Every day | ORAL | 3 refills | Status: DC

## 2016-06-06 NOTE — Telephone Encounter (Signed)
Refill levothyroxin 0.075 mg daily for one year

## 2016-06-12 ENCOUNTER — Other Ambulatory Visit: Payer: Self-pay

## 2016-06-12 MED ORDER — LEVOTHYROXINE SODIUM 75 MCG PO TABS: 75.0000 ug | ORAL_TABLET | Freq: Every day | ORAL | 3 refills | Status: DC

## 2016-06-12 MED ORDER — FUROSEMIDE 40 MG PO TABS: 40.0000 mg | ORAL_TABLET | Freq: Every day | ORAL | 3 refills | Status: DC

## 2016-06-12 MED ORDER — METOPROLOL SUCCINATE ER 25 MG PO TB24: 25.0000 mg | ORAL_TABLET | Freq: Every day | ORAL | 3 refills | Status: DC

## 2016-06-12 MED ORDER — AMLODIPINE BESYLATE 5 MG PO TABS: 5.0000 mg | ORAL_TABLET | Freq: Every day | ORAL | 3 refills | Status: DC

## 2016-06-19 DIAGNOSIS — H527 Unspecified disorder of refraction: Secondary | ICD-10-CM | POA: Diagnosis not present

## 2016-06-19 DIAGNOSIS — H353111 Nonexudative age-related macular degeneration, right eye, early dry stage: Secondary | ICD-10-CM | POA: Diagnosis not present

## 2016-06-19 DIAGNOSIS — H2513 Age-related nuclear cataract, bilateral: Secondary | ICD-10-CM | POA: Diagnosis not present

## 2016-06-19 DIAGNOSIS — H40033 Anatomical narrow angle, bilateral: Secondary | ICD-10-CM | POA: Diagnosis not present

## 2016-06-23 ENCOUNTER — Encounter: Payer: Self-pay | Admitting: Internal Medicine

## 2016-06-23 ENCOUNTER — Ambulatory Visit (INDEPENDENT_AMBULATORY_CARE_PROVIDER_SITE_OTHER): Payer: Medicare Other | Admitting: Internal Medicine

## 2016-06-23 VITALS — BP 120/80 | HR 63 | Temp 97.3°F | Wt 241.0 lb

## 2016-06-23 DIAGNOSIS — I10 Essential (primary) hypertension: Secondary | ICD-10-CM

## 2016-06-23 DIAGNOSIS — Z79899 Other long term (current) drug therapy: Secondary | ICD-10-CM | POA: Diagnosis not present

## 2016-06-23 DIAGNOSIS — E785 Hyperlipidemia, unspecified: Secondary | ICD-10-CM | POA: Diagnosis not present

## 2016-06-23 DIAGNOSIS — E119 Type 2 diabetes mellitus without complications: Secondary | ICD-10-CM | POA: Diagnosis not present

## 2016-06-23 DIAGNOSIS — E039 Hypothyroidism, unspecified: Secondary | ICD-10-CM

## 2016-06-23 DIAGNOSIS — M19012 Primary osteoarthritis, left shoulder: Secondary | ICD-10-CM | POA: Diagnosis not present

## 2016-06-23 LAB — HEPATIC FUNCTION PANEL
ALBUMIN: 3.8 g/dL (ref 3.6–5.1)
ALT: 10 U/L (ref 6–29)
AST: 21 U/L (ref 10–35)
Alkaline Phosphatase: 61 U/L (ref 33–130)
BILIRUBIN DIRECT: 0.1 mg/dL (ref <=0.2)
BILIRUBIN TOTAL: 0.4 mg/dL (ref 0.2–1.2)
Indirect Bilirubin: 0.3 mg/dL (ref 0.2–1.2)
Total Protein: 7.7 g/dL (ref 6.1–8.1)

## 2016-06-23 LAB — LIPID PANEL
CHOL/HDL RATIO: 3.2 ratio (ref <5.0)
Cholesterol: 164 mg/dL (ref <200)
HDL: 51 mg/dL (ref >50)
LDL CALC: 90 mg/dL (ref <100)
Triglycerides: 116 mg/dL (ref <150)
VLDL: 23 mg/dL (ref <30)

## 2016-06-23 LAB — TSH: TSH: 2.09 mIU/L

## 2016-06-23 MED ORDER — TRAMADOL HCL 50 MG PO TABS: ORAL_TABLET | ORAL | 1 refills | Status: DC

## 2016-06-23 NOTE — Progress Notes (Signed)
   Subjective:    Patient ID: [name], female    DOB: December 15, 1940, 76 y.o.   MRN: [id number]  HPI   76 year old Female having left shoulder pain and may need surgery. Needs cataract surgery. Plans to have this in [place].  BP is excellent at 120/80. Labs drawn and pending.  She has a history of chronic kidney disease, dependent edema, controlled type 2 diabetes mellitus, hypertension, hyperlipidemia, hypothyroidism.  She has osteoarthritis of her left knee and is status post right knee replacement. Left shoulder is hurting quite a bit. I've given her prescription for tramadol which she has had in the past for pain.      Review of Systems see above     Objective:   Physical Exam Skin warm and dry. Nodes none. Neck is supple without JVD thyromegaly or carotid bruits. Chest clear to auscultation. Cardiac exam regular rate and rhythm normal S1 and S2. Dorsalis pedis pulses are present in the feet and there is no evidence of ulcers or calluses in her feet.       Assessment & Plan:  Left shoulder arthropathy  Left knee osteoarthritis  Status post right knee replacement  Essential hypertension  Hyperlipidemia  Chronic kidney disease  Diabetes mellitus controlled  Hypothyroidism  History of dependent edema  Plan: Testing labs drawn and pending. Return in 6 months for physical exam. Tramadol prescription written.

## 2016-06-23 NOTE — Addendum Note (Signed)
Addended by: Inocencio Homes on: 06/23/2016 03:11 PM   Modules accepted: Orders

## 2016-06-23 NOTE — Patient Instructions (Signed)
It was a pleasure to see you today. Take tramadol sparingly for left knee osteoarthritis and left shoulder pain. Return in 6 months for physical exam. Medically approved for cataract extraction and left shoulder surgery.

## 2016-06-24 LAB — HEMOGLOBIN A1C
HEMOGLOBIN A1C: 5.4 % (ref <5.7)
Mean Plasma Glucose: 108 mg/dL

## 2016-06-24 LAB — MICROALBUMIN / CREATININE URINE RATIO
CREATININE, URINE: 398 mg/dL — AB (ref 20–320)
Creatinine, Urine: 386 mg/dL — ABNORMAL HIGH (ref 20–320)
MICROALB UR: 1.6 mg/dL
MICROALB/CREAT RATIO: 4 ug/mg{creat} (ref <30)
Microalb Creat Ratio: 4 mcg/mg creat (ref <30)
Microalb, Ur: 1.6 mg/dL

## 2016-07-15 DIAGNOSIS — M19012 Primary osteoarthritis, left shoulder: Secondary | ICD-10-CM | POA: Diagnosis not present

## 2016-07-29 NOTE — H&P (Signed)
[name] is an 76 y.o. female.    Chief Complaint: left shoulder pain  HPI: Pt is a 76 y.o. female complaining of left shoulder pain for multiple years. Pain had continually increased since the beginning. X-rays in the clinic show end-stage arthritic changes of the left shoulder. Pt has tried various conservative treatments which have failed to alleviate their symptoms, including injections and therapy. Various options are discussed with the patient. Risks, benefits and expectations were discussed with the patient. Patient understand the risks, benefits and expectations and wishes to proceed with surgery.   PCP:  Elby Showers, MD  D/C Plans: Home  PMH: Past Medical History:  Diagnosis Date  . Anxiety   . Arthritis    ostearthritis. Degenerative disc disease- back  . Back pain   . Diabetes mellitus    pre diabetes only  . Hyperlipidemia   . Hypertension   . Hypothyroidism   . Renal insufficiency   . Renal insufficiency   . Rotator cuff disorder    Pain with limited ROM right, spur left shoulder  . Thyroid disease     PSH: Past Surgical History:  Procedure Laterality Date  . INJECTION KNEE Right 07/16/2015   Procedure: RIGHT KNEE INJECTION;  Surgeon: Gaynelle Arabian, MD;  Location: [facility];  Service: Orthopedics;  Laterality: Right;  . JOINT REPLACEMENT Right 10/2008   RTHA-hip  . TOTAL KNEE ARTHROPLASTY Left 07/16/2015   Procedure: LEFT TOTAL KNEE ARTHROPLASTY;  Surgeon: Gaynelle Arabian, MD;  Location: [facility];  Service: Orthopedics;  Laterality: Left;    Social History:  reports that she has never smoked. She has never used smokeless tobacco. She reports that she does not drink alcohol or use drugs.  Allergies:  Allergies  Allergen Reactions  . Prinivil [Lisinopril] Cough    Medications: No current facility-administered medications for this encounter.    Current Outpatient Prescriptions  Medication Sig Dispense Refill  . ALPRAZolam (NIRAVAM) 0.5 MG dissolvable  tablet One half to one tab po bid prn dizziness and anxiety 60 tablet 3  . amLODipine (NORVASC) 5 MG tablet Take 1 tablet (5 mg total) by mouth daily. 90 tablet 3  . furosemide (LASIX) 40 MG tablet Take 1 tablet (40 mg total) by mouth daily. 90 tablet 3  . HYDROcodone-acetaminophen (NORCO) 5-325 MG tablet Take 1 tablet by mouth every 6 (six) hours as needed for moderate pain. 30 tablet 0  . hydrocortisone 2.5 % cream Apply topically 2 (two) times daily. 30 g 0  . ketoconazole (NIZORAL) 2 % cream Apply under breasts daily for itching 15 g prn  . levothyroxine (SYNTHROID, LEVOTHROID) 75 MCG tablet Take 1 tablet (75 mcg total) by mouth daily before breakfast. 90 tablet 3  . methocarbamol (ROBAXIN) 500 MG tablet Take 1 tablet (500 mg total) by mouth every 6 (six) hours as needed for muscle spasms. 90 tablet 0  . metoprolol succinate (TOPROL-XL) 25 MG 24 hr tablet Take 1 tablet (25 mg total) by mouth daily. 90 tablet 3  . potassium chloride (K-DUR) 10 MEQ tablet Take 1 tablet (10 mEq total) by mouth daily. 90 tablet 3  . rivaroxaban (XARELTO) 10 MG TABS tablet Take 1 tablet (10 mg total) by mouth daily with breakfast. Take Xarelto for two and a half more weeks, then discontinue Xarelto. Once the patient has completed the Xarelto, they may resume the 81 mg Aspirin. 19 tablet 0  . simvastatin (ZOCOR) 10 MG tablet Take 1 tablet (10 mg total) by mouth every  evening. 90 tablet 3  . traMADol (ULTRAM) 50 MG tablet One po 3 times daily as needed for pain 90 tablet 1    No results found for this or any previous visit (from the past 48 hour(s)). No results found.  ROS: Pain with rom of the left upper extremity  Physical Exam:  Alert and oriented 76 y.o. female in no acute distress Cranial nerves 2-12 intact Cervical spine: full rom with no tenderness, nv intact distally Chest: active breath sounds bilaterally, no wheeze rhonchi or rales Heart: regular rate and rhythm, no murmur Abd: non tender non  distended with active bowel sounds Hip is stable with rom  Left shoulder limited rom Crepitus with rom nv intact distally No rashes or edema  Assessment/Plan Assessment: left shoulder cuff arthropathy  Plan: Patient will undergo a left reverse total shoulder by Dr. Veverly Fells at [facility]. Risks benefits and expectations were discussed with the patient. Patient understand risks, benefits and expectations and wishes to proceed.

## 2016-07-31 ENCOUNTER — Encounter (HOSPITAL_COMMUNITY): Payer: Self-pay

## 2016-07-31 NOTE — Pre-Procedure Instructions (Signed)
[name]  07/31/2016      [facility] - [place], [place] - [address] [place] [address] Phone: [phone] Fax: [place], [address] [place] [address] Phone: [phone] Fax: [phone]    Your procedure is scheduled on Friday June 8.  Report to [facility] Admitting at 8:00 A.M.  Call this number if you have problems the morning of surgery:  [phone]   Remember:  Do not eat food or drink liquids after midnight.  Take these medicines the morning of surgery with A SIP OF WATER: amlodipine (norvasc), levothyroxine (synthroid), metoprolol (Toprol-XL), tramadol (ultram) if needed  7 days prior to surgery STOP taking any Aspirin, Aleve, Naproxen, Ibuprofen, Motrin, Advil, Goody's, BC's, all herbal medications, fish oil, and all vitamins      How to Manage Your Diabetes Before and After Surgery  Why is it important to control my blood sugar before and after surgery? . Improving blood sugar levels before and after surgery helps healing and can limit problems. . A way of improving blood sugar control is eating a healthy diet by: o  Eating less sugar and carbohydrates o  Increasing activity/exercise o  Talking with your doctor about reaching your blood sugar goals . High blood sugars (greater than 180 mg/dL) can raise your risk of infections and slow your recovery, so you will need to focus on controlling your diabetes during the weeks before surgery. . Make sure that the doctor who takes care of your diabetes knows about your planned surgery including the date and location.  How do I manage my blood sugar before surgery? . Check your blood sugar at least 4 times a day, starting 2 days before surgery, to make sure that the level is not too high or low. o Check your blood sugar the morning of your surgery when you wake up and every 2 hours until you get to the Short Stay  unit. . If your blood sugar is less than 70 mg/dL, you will need to treat for low blood sugar: o Do not take insulin. o Treat a low blood sugar (less than 70 mg/dL) with  cup of clear juice (cranberry or apple), 4 glucose tablets, OR glucose gel. o Recheck blood sugar in 15 minutes after treatment (to make sure it is greater than 70 mg/dL). If your blood sugar is not greater than 70 mg/dL on recheck, call [phone] for further instructions. . Report your blood sugar to the short stay nurse when you get to Short Stay.  . If you are admitted to the hospital after surgery: o Your blood sugar will be checked by the staff and you will probably be given insulin after surgery (instead of oral diabetes medicines) to make sure you have good blood sugar levels. o The goal for blood sugar control after surgery is 80-180 mg/dL.              WHAT DO I DO ABOUT MY DIABETES MEDICATION?   Marland Kitchen Do not take oral diabetes medicines (pills) the morning of surgery.  . The day of surgery, do not take other diabetes injectables, including Byetta (exenatide), Bydureon (exenatide ER), Victoza (liraglutide), or Trulicity (dulaglutide).  . If your CBG is greater than 220 mg/dL, you may take  of your sliding scale (correction) dose of insulin.    Do not wear jewelry, make-up or nail polish.  Do not wear lotions, powders, or perfumes, or deoderant.  Do not shave 48 hours prior to surgery.  Men may shave face and neck.  Do not bring valuables to the hospital.  [facility] is not responsible for any belongings or valuables.  Contacts, dentures or bridgework may not be worn into surgery.  Leave your suitcase in the car.  After surgery it may be brought to your room.  For patients admitted to the hospital, discharge time will be determined by your treatment team.  Patients discharged the day of surgery will not be allowed to drive home.    Special instructions:    [place]- Preparing For  Surgery  Before surgery, you can play an important role. Because skin is not sterile, your skin needs to be as free of germs as possible. You can reduce the number of germs on your skin by washing with CHG (chlorahexidine gluconate) Soap before surgery.  CHG is an antiseptic cleaner which kills germs and bonds with the skin to continue killing germs even after washing.  Please do not use if you have an allergy to CHG or antibacterial soaps. If your skin becomes reddened/irritated stop using the CHG.  Do not shave (including legs and underarms) for at least 48 hours prior to first CHG shower. It is OK to shave your face.  Please follow these instructions carefully.   1. Shower the NIGHT BEFORE SURGERY and the MORNING OF SURGERY with CHG.   2. If you chose to wash your hair, wash your hair first as usual with your normal shampoo.  3. After you shampoo, rinse your hair and body thoroughly to remove the shampoo.  4. Use CHG as you would any other liquid soap. You can apply CHG directly to the skin and wash gently with a scrungie or a clean washcloth.   5. Apply the CHG Soap to your body ONLY FROM THE NECK DOWN.  Do not use on open wounds or open sores. Avoid contact with your eyes, ears, mouth and genitals (private parts). Wash genitals (private parts) with your normal soap.  6. Wash thoroughly, paying special attention to the area where your surgery will be performed.  7. Thoroughly rinse your body with warm water from the neck down.  8. DO NOT shower/wash with your normal soap after using and rinsing off the CHG Soap.  9. Pat yourself dry with a CLEAN TOWEL.   10. Wear CLEAN PAJAMAS   11. Place CLEAN SHEETS on your bed the night of your first shower and DO NOT SLEEP WITH PETS.    Day of Surgery: Do not apply any deodorants/lotions. Please wear clean clothes to the hospital/surgery center.      Please read over the following fact sheets that you were given. MRSA  Information

## 2016-08-01 ENCOUNTER — Encounter (HOSPITAL_COMMUNITY)
Admission: RE | Admit: 2016-08-01 | Discharge: 2016-08-01 | Disposition: A | Discharge status: Home / Self Care | Payer: Medicare Other | Source: Ambulatory Visit | Attending: Orthopedic Surgery | Admitting: Orthopedic Surgery

## 2016-08-01 ENCOUNTER — Encounter (HOSPITAL_COMMUNITY): Payer: Self-pay

## 2016-08-01 DIAGNOSIS — M13812 Other specified arthritis, left shoulder: Secondary | ICD-10-CM | POA: Diagnosis not present

## 2016-08-01 DIAGNOSIS — Z01810 Encounter for preprocedural cardiovascular examination: Secondary | ICD-10-CM | POA: Insufficient documentation

## 2016-08-01 DIAGNOSIS — E079 Disorder of thyroid, unspecified: Secondary | ICD-10-CM | POA: Insufficient documentation

## 2016-08-01 DIAGNOSIS — Z01812 Encounter for preprocedural laboratory examination: Secondary | ICD-10-CM | POA: Diagnosis not present

## 2016-08-01 DIAGNOSIS — N289 Disorder of kidney and ureter, unspecified: Secondary | ICD-10-CM | POA: Diagnosis not present

## 2016-08-01 DIAGNOSIS — I10 Essential (primary) hypertension: Secondary | ICD-10-CM | POA: Diagnosis not present

## 2016-08-01 DIAGNOSIS — R7303 Prediabetes: Secondary | ICD-10-CM | POA: Diagnosis not present

## 2016-08-01 DIAGNOSIS — E785 Hyperlipidemia, unspecified: Secondary | ICD-10-CM | POA: Insufficient documentation

## 2016-08-01 DIAGNOSIS — I4510 Unspecified right bundle-branch block: Secondary | ICD-10-CM | POA: Insufficient documentation

## 2016-08-01 HISTORY — DX: Prediabetes: R73.03

## 2016-08-01 LAB — CBC
HEMATOCRIT: 37.4 % (ref 36.0–46.0)
Hemoglobin: 12.5 g/dL (ref 12.0–15.0)
MCH: 32 pg (ref 26.0–34.0)
MCHC: 33.4 g/dL (ref 30.0–36.0)
MCV: 95.7 fL (ref 78.0–100.0)
Platelets: 240 10*3/uL (ref 150–400)
RBC: 3.91 MIL/uL (ref 3.87–5.11)
RDW: 13 % (ref 11.5–15.5)
WBC: 4.7 10*3/uL (ref 4.0–10.5)

## 2016-08-01 LAB — SURGICAL PCR SCREEN
MRSA, PCR: NEGATIVE
Staphylococcus aureus: NEGATIVE

## 2016-08-01 LAB — BASIC METABOLIC PANEL
ANION GAP: 8 (ref 5–15)
BUN: 13 mg/dL (ref 6–20)
CALCIUM: 9.4 mg/dL (ref 8.9–10.3)
CO2: 27 mmol/L (ref 22–32)
CREATININE: 1.2 mg/dL — AB (ref 0.44–1.00)
Chloride: 104 mmol/L (ref 101–111)
GFR calc Af Amer: 50 mL/min — ABNORMAL LOW (ref >60)
GFR, EST NON AFRICAN AMERICAN: 43 mL/min — AB (ref >60)
GLUCOSE: 95 mg/dL (ref 65–99)
Potassium: 4.1 mmol/L (ref 3.5–5.1)
Sodium: 139 mmol/L (ref 135–145)

## 2016-08-01 LAB — GLUCOSE, CAPILLARY: Glucose-Capillary: 90 mg/dL (ref 65–99)

## 2016-08-01 NOTE — Progress Notes (Signed)
PCP - Tedra [place] Cardiologist - denies heart issues, cardiologist or cardiac workup  EKG- 08/01/2016  Stress Test - 2014  Patient denies shortness of breath, fever, cough and chest pain at PAT appointment  Patient verbalized understanding of instructions that were given to them at the PAT appointment. Patient was also instructed that they will need to review over the PAT instructions again at home before surgery.

## 2016-08-14 MED ORDER — CEFAZOLIN SODIUM-DEXTROSE 2-4 GM/100ML-% IV SOLN
2.0000 g | INTRAVENOUS | Status: AC
  Administered 2016-08-15: 2 g via INTRAVENOUS
  Filled 2016-08-14: qty 100

## 2016-08-15 ENCOUNTER — Inpatient Hospital Stay (HOSPITAL_COMMUNITY)
Admission: RE | Admit: 2016-08-15 | Discharge: 2016-08-16 | DRG: 483 | Disposition: A | Discharge status: Home / Self Care | Payer: Medicare Other | Source: Ambulatory Visit | Attending: Orthopedic Surgery | Admitting: Orthopedic Surgery

## 2016-08-15 ENCOUNTER — Inpatient Hospital Stay (HOSPITAL_COMMUNITY): Payer: Medicare Other

## 2016-08-15 ENCOUNTER — Inpatient Hospital Stay (HOSPITAL_COMMUNITY): Payer: Medicare Other | Admitting: Anesthesiology

## 2016-08-15 ENCOUNTER — Encounter (HOSPITAL_COMMUNITY)
Admission: RE | Disposition: A | Discharge status: Home / Self Care | Payer: Self-pay | Source: Ambulatory Visit | Attending: Orthopedic Surgery

## 2016-08-15 ENCOUNTER — Encounter (HOSPITAL_COMMUNITY): Payer: Self-pay | Admitting: *Deleted

## 2016-08-15 DIAGNOSIS — M25712 Osteophyte, left shoulder: Secondary | ICD-10-CM | POA: Diagnosis present

## 2016-08-15 DIAGNOSIS — E039 Hypothyroidism, unspecified: Secondary | ICD-10-CM | POA: Diagnosis present

## 2016-08-15 DIAGNOSIS — G8918 Other acute postprocedural pain: Secondary | ICD-10-CM | POA: Diagnosis not present

## 2016-08-15 DIAGNOSIS — E119 Type 2 diabetes mellitus without complications: Secondary | ICD-10-CM | POA: Diagnosis present

## 2016-08-15 DIAGNOSIS — M25512 Pain in left shoulder: Secondary | ICD-10-CM | POA: Diagnosis not present

## 2016-08-15 DIAGNOSIS — Z888 Allergy status to other drugs, medicaments and biological substances status: Secondary | ICD-10-CM

## 2016-08-15 DIAGNOSIS — M75102 Unspecified rotator cuff tear or rupture of left shoulder, not specified as traumatic: Secondary | ICD-10-CM | POA: Diagnosis not present

## 2016-08-15 DIAGNOSIS — F419 Anxiety disorder, unspecified: Secondary | ICD-10-CM | POA: Diagnosis present

## 2016-08-15 DIAGNOSIS — I10 Essential (primary) hypertension: Secondary | ICD-10-CM | POA: Diagnosis present

## 2016-08-15 DIAGNOSIS — M19012 Primary osteoarthritis, left shoulder: Secondary | ICD-10-CM | POA: Diagnosis present

## 2016-08-15 DIAGNOSIS — Z79899 Other long term (current) drug therapy: Secondary | ICD-10-CM | POA: Diagnosis not present

## 2016-08-15 DIAGNOSIS — E785 Hyperlipidemia, unspecified: Secondary | ICD-10-CM | POA: Diagnosis present

## 2016-08-15 DIAGNOSIS — Z96612 Presence of left artificial shoulder joint: Secondary | ICD-10-CM | POA: Diagnosis not present

## 2016-08-15 DIAGNOSIS — Z471 Aftercare following joint replacement surgery: Secondary | ICD-10-CM | POA: Diagnosis not present

## 2016-08-15 DIAGNOSIS — Z6841 Body Mass Index (BMI) 40.0 and over, adult: Secondary | ICD-10-CM

## 2016-08-15 HISTORY — PX: REVERSE SHOULDER ARTHROPLASTY: SHX5054

## 2016-08-15 LAB — GLUCOSE, CAPILLARY: Glucose-Capillary: 124 mg/dL — ABNORMAL HIGH (ref 65–99)

## 2016-08-15 SURGERY — ARTHROPLASTY, SHOULDER, TOTAL, REVERSE
Anesthesia: General | Site: Shoulder | Laterality: Left

## 2016-08-15 MED ORDER — FENTANYL CITRATE (PF) 250 MCG/5ML IJ SOLN
INTRAMUSCULAR | Status: AC
  Filled 2016-08-15: qty 5

## 2016-08-15 MED ORDER — FENTANYL CITRATE (PF) 100 MCG/2ML IJ SOLN
INTRAMUSCULAR | Status: AC
  Filled 2016-08-15: qty 2

## 2016-08-15 MED ORDER — POTASSIUM CHLORIDE ER 10 MEQ PO TBCR
10.0000 meq | EXTENDED_RELEASE_TABLET | Freq: Every day | ORAL | Status: DC
  Filled 2016-08-15: qty 1

## 2016-08-15 MED ORDER — TRAMADOL HCL 50 MG PO TABS: 50.0000 mg | ORAL_TABLET | Freq: Three times a day (TID) | ORAL | Status: DC | PRN

## 2016-08-15 MED ORDER — DEXAMETHASONE SODIUM PHOSPHATE 10 MG/ML IJ SOLN
INTRAMUSCULAR | Status: DC | PRN
  Administered 2016-08-15: 10 mg via INTRAVENOUS

## 2016-08-15 MED ORDER — ONDANSETRON HCL 4 MG/2ML IJ SOLN
INTRAMUSCULAR | Status: DC | PRN
  Administered 2016-08-15: 4 mg via INTRAVENOUS

## 2016-08-15 MED ORDER — EPHEDRINE 5 MG/ML INJ
INTRAVENOUS | Status: AC
  Filled 2016-08-15: qty 10

## 2016-08-15 MED ORDER — ONDANSETRON HCL 4 MG PO TABS: 4.0000 mg | ORAL_TABLET | Freq: Four times a day (QID) | ORAL | Status: DC | PRN

## 2016-08-15 MED ORDER — HYDROCORTISONE 1 % EX CREA
1.0000 "application " | TOPICAL_CREAM | Freq: Two times a day (BID) | CUTANEOUS | Status: DC | PRN
  Filled 2016-08-15: qty 28

## 2016-08-15 MED ORDER — 0.9 % SODIUM CHLORIDE (POUR BTL) OPTIME
TOPICAL | Status: DC | PRN
  Administered 2016-08-15 (×2): 1000 mL

## 2016-08-15 MED ORDER — MIDAZOLAM HCL 2 MG/2ML IJ SOLN
INTRAMUSCULAR | Status: AC
  Administered 2016-08-15: 1 mg via INTRAVENOUS
  Filled 2016-08-15: qty 2

## 2016-08-15 MED ORDER — FENTANYL CITRATE (PF) 100 MCG/2ML IJ SOLN
50.0000 ug | Freq: Once | INTRAMUSCULAR | Status: AC
  Administered 2016-08-15: 50 ug via INTRAVENOUS
  Filled 2016-08-15: qty 1

## 2016-08-15 MED ORDER — MULTI-VITAMIN/MINERALS PO TABS: 1.0000 | ORAL_TABLET | Freq: Every day | ORAL | Status: DC

## 2016-08-15 MED ORDER — MORPHINE SULFATE (PF) 4 MG/ML IV SOLN: 2.0000 mg | INTRAVENOUS | Status: DC | PRN

## 2016-08-15 MED ORDER — KETOCONAZOLE 2 % EX CREA
1.0000 "application " | TOPICAL_CREAM | Freq: Every day | CUTANEOUS | Status: DC | PRN
  Filled 2016-08-15: qty 15

## 2016-08-15 MED ORDER — BUPIVACAINE-EPINEPHRINE (PF) 0.5% -1:200000 IJ SOLN
INTRAMUSCULAR | Status: DC | PRN
  Administered 2016-08-15: 30 mL via PERINEURAL

## 2016-08-15 MED ORDER — DOCUSATE SODIUM 100 MG PO CAPS
100.0000 mg | ORAL_CAPSULE | Freq: Two times a day (BID) | ORAL | Status: DC
  Administered 2016-08-15 – 2016-08-16 (×2): 100 mg via ORAL
  Filled 2016-08-15 (×2): qty 1

## 2016-08-15 MED ORDER — LIDOCAINE 2% (20 MG/ML) 5 ML SYRINGE
INTRAMUSCULAR | Status: AC
  Filled 2016-08-15: qty 5

## 2016-08-15 MED ORDER — METOCLOPRAMIDE HCL 5 MG/ML IJ SOLN: 5.0000 mg | Freq: Three times a day (TID) | INTRAMUSCULAR | Status: DC | PRN

## 2016-08-15 MED ORDER — SIMVASTATIN 10 MG PO TABS
10.0000 mg | ORAL_TABLET | Freq: Every evening | ORAL | Status: DC
  Administered 2016-08-15: 10 mg via ORAL
  Filled 2016-08-15: qty 1

## 2016-08-15 MED ORDER — POTASSIUM CHLORIDE CRYS ER 10 MEQ PO TBCR
10.0000 meq | EXTENDED_RELEASE_TABLET | Freq: Every day | ORAL | Status: DC
  Administered 2016-08-15 – 2016-08-16 (×2): 10 meq via ORAL
  Filled 2016-08-15: qty 1

## 2016-08-15 MED ORDER — DEXAMETHASONE SODIUM PHOSPHATE 10 MG/ML IJ SOLN
INTRAMUSCULAR | Status: AC
  Filled 2016-08-15: qty 1

## 2016-08-15 MED ORDER — CHLORHEXIDINE GLUCONATE 4 % EX LIQD: 60.0000 mL | Freq: Once | CUTANEOUS | Status: DC

## 2016-08-15 MED ORDER — METOPROLOL SUCCINATE ER 25 MG PO TB24
25.0000 mg | ORAL_TABLET | Freq: Every day | ORAL | Status: DC
  Administered 2016-08-16: 25 mg via ORAL
  Filled 2016-08-15: qty 1

## 2016-08-15 MED ORDER — SODIUM CHLORIDE 0.9 % IV SOLN
INTRAVENOUS | Status: DC
  Administered 2016-08-15: 16:00:00 via INTRAVENOUS

## 2016-08-15 MED ORDER — ONDANSETRON HCL 4 MG/2ML IJ SOLN: 4.0000 mg | Freq: Four times a day (QID) | INTRAMUSCULAR | Status: DC | PRN

## 2016-08-15 MED ORDER — ACETAMINOPHEN 650 MG RE SUPP: 650.0000 mg | Freq: Four times a day (QID) | RECTAL | Status: DC | PRN

## 2016-08-15 MED ORDER — AMLODIPINE BESYLATE 5 MG PO TABS
5.0000 mg | ORAL_TABLET | Freq: Every day | ORAL | Status: DC
  Administered 2016-08-16: 5 mg via ORAL
  Filled 2016-08-15: qty 1

## 2016-08-15 MED ORDER — LEVOTHYROXINE SODIUM 75 MCG PO TABS
75.0000 ug | ORAL_TABLET | Freq: Every day | ORAL | Status: DC
  Administered 2016-08-16: 75 ug via ORAL
  Filled 2016-08-15: qty 1

## 2016-08-15 MED ORDER — ROCURONIUM BROMIDE 10 MG/ML (PF) SYRINGE
PREFILLED_SYRINGE | INTRAVENOUS | Status: AC
  Filled 2016-08-15: qty 5

## 2016-08-15 MED ORDER — SUGAMMADEX SODIUM 200 MG/2ML IV SOLN
INTRAVENOUS | Status: DC | PRN
  Administered 2016-08-15: 200 mg via INTRAVENOUS

## 2016-08-15 MED ORDER — HYDROCODONE-ACETAMINOPHEN 5-325 MG PO TABS: 1.0000 | ORAL_TABLET | Freq: Four times a day (QID) | ORAL | 0 refills | Status: DC | PRN

## 2016-08-15 MED ORDER — LIDOCAINE HCL (CARDIAC) 20 MG/ML IV SOLN
INTRAVENOUS | Status: DC | PRN
  Administered 2016-08-15: 40 mg via INTRAVENOUS

## 2016-08-15 MED ORDER — HYDROCODONE-ACETAMINOPHEN 5-325 MG PO TABS
1.0000 | ORAL_TABLET | ORAL | Status: DC | PRN
  Administered 2016-08-15: 1 via ORAL
  Administered 2016-08-16 (×3): 2 via ORAL
  Filled 2016-08-15: qty 2
  Filled 2016-08-15: qty 1
  Filled 2016-08-15 (×2): qty 2

## 2016-08-15 MED ORDER — MIDAZOLAM HCL 2 MG/2ML IJ SOLN
1.0000 mg | Freq: Once | INTRAMUSCULAR | Status: AC
  Administered 2016-08-15: 1 mg via INTRAVENOUS
  Filled 2016-08-15: qty 1

## 2016-08-15 MED ORDER — ROCURONIUM BROMIDE 100 MG/10ML IV SOLN
INTRAVENOUS | Status: DC | PRN
  Administered 2016-08-15: 50 mg via INTRAVENOUS

## 2016-08-15 MED ORDER — ONDANSETRON HCL 4 MG/2ML IJ SOLN
INTRAMUSCULAR | Status: AC
  Filled 2016-08-15: qty 2

## 2016-08-15 MED ORDER — SUGAMMADEX SODIUM 200 MG/2ML IV SOLN
INTRAVENOUS | Status: AC
  Filled 2016-08-15: qty 2

## 2016-08-15 MED ORDER — PHENOL 1.4 % MT LIQD: 1.0000 | OROMUCOSAL | Status: DC | PRN

## 2016-08-15 MED ORDER — FENTANYL CITRATE (PF) 100 MCG/2ML IJ SOLN
INTRAMUSCULAR | Status: DC | PRN
  Administered 2016-08-15: 50 ug via INTRAVENOUS

## 2016-08-15 MED ORDER — BUPIVACAINE-EPINEPHRINE (PF) 0.25% -1:200000 IJ SOLN
INTRAMUSCULAR | Status: AC
  Filled 2016-08-15: qty 30

## 2016-08-15 MED ORDER — PHENYLEPHRINE HCL 10 MG/ML IJ SOLN
INTRAVENOUS | Status: DC | PRN
  Administered 2016-08-15: 25 ug/min via INTRAVENOUS

## 2016-08-15 MED ORDER — MENTHOL 3 MG MT LOZG: 1.0000 | LOZENGE | OROMUCOSAL | Status: DC | PRN

## 2016-08-15 MED ORDER — EPHEDRINE SULFATE-NACL 50-0.9 MG/10ML-% IV SOSY
PREFILLED_SYRINGE | INTRAVENOUS | Status: DC | PRN
  Administered 2016-08-15: 5 mg via INTRAVENOUS

## 2016-08-15 MED ORDER — PROPOFOL 10 MG/ML IV BOLUS
INTRAVENOUS | Status: DC | PRN
  Administered 2016-08-15: 100 mg via INTRAVENOUS

## 2016-08-15 MED ORDER — CEFAZOLIN SODIUM-DEXTROSE 2-4 GM/100ML-% IV SOLN
2.0000 g | Freq: Four times a day (QID) | INTRAVENOUS | Status: AC
  Administered 2016-08-15 – 2016-08-16 (×3): 2 g via INTRAVENOUS
  Filled 2016-08-15 (×3): qty 100

## 2016-08-15 MED ORDER — FENTANYL CITRATE (PF) 100 MCG/2ML IJ SOLN
25.0000 ug | INTRAMUSCULAR | Status: DC | PRN
  Administered 2016-08-15: 25 ug via INTRAVENOUS

## 2016-08-15 MED ORDER — FUROSEMIDE 40 MG PO TABS
40.0000 mg | ORAL_TABLET | Freq: Every day | ORAL | Status: DC
  Administered 2016-08-16: 40 mg via ORAL
  Filled 2016-08-15: qty 1

## 2016-08-15 MED ORDER — BUPIVACAINE-EPINEPHRINE 0.25% -1:200000 IJ SOLN
INTRAMUSCULAR | Status: DC | PRN
  Administered 2016-08-15: 9 mL

## 2016-08-15 MED ORDER — FENTANYL CITRATE (PF) 100 MCG/2ML IJ SOLN
INTRAMUSCULAR | Status: AC
  Administered 2016-08-15: 50 ug via INTRAVENOUS
  Filled 2016-08-15: qty 2

## 2016-08-15 MED ORDER — METOCLOPRAMIDE HCL 5 MG PO TABS: 5.0000 mg | ORAL_TABLET | Freq: Three times a day (TID) | ORAL | Status: DC | PRN

## 2016-08-15 MED ORDER — ACETAMINOPHEN 325 MG PO TABS: 650.0000 mg | ORAL_TABLET | Freq: Four times a day (QID) | ORAL | Status: DC | PRN

## 2016-08-15 MED ORDER — PROPOFOL 10 MG/ML IV BOLUS
INTRAVENOUS | Status: AC
  Filled 2016-08-15: qty 20

## 2016-08-15 MED ORDER — POLYETHYLENE GLYCOL 3350 17 G PO PACK: 17.0000 g | PACK | Freq: Every day | ORAL | Status: DC | PRN

## 2016-08-15 MED ORDER — MEPERIDINE HCL 25 MG/ML IJ SOLN: 6.2500 mg | INTRAMUSCULAR | Status: DC | PRN

## 2016-08-15 MED ORDER — POTASSIUM CHLORIDE ER 10 MEQ PO TBCR
10.0000 meq | EXTENDED_RELEASE_TABLET | Freq: Every day | ORAL | Status: DC
  Filled 2016-08-15 (×2): qty 1

## 2016-08-15 MED ORDER — LACTATED RINGERS IV SOLN
INTRAVENOUS | Status: DC
  Administered 2016-08-15: 08:00:00 via INTRAVENOUS

## 2016-08-15 MED ORDER — ASPIRIN EC 81 MG PO TBEC: 81.0000 mg | DELAYED_RELEASE_TABLET | Freq: Every day | ORAL | Status: DC

## 2016-08-15 MED ORDER — METOCLOPRAMIDE HCL 5 MG/ML IJ SOLN: 10.0000 mg | Freq: Once | INTRAMUSCULAR | Status: DC | PRN

## 2016-08-15 SURGICAL SUPPLY — 71 items
BIT DRILL 5/64X5 DISP (BIT) IMPLANT
BLADE SAG 18X100X1.27 (BLADE) ×3 IMPLANT
BOWL SMART MIX CTS (DISPOSABLE) ×3 IMPLANT
CAPT SHLDR REVTOTAL 1 ×3 IMPLANT
CEMENT HV SMART SET (Cement) ×3 IMPLANT
CLOSURE STERI-STRIP 1/2X4 (GAUZE/BANDAGES/DRESSINGS) ×1
CLOSURE WOUND 1/2 X4 (GAUZE/BANDAGES/DRESSINGS) ×1
CLSR STERI-STRIP ANTIMIC 1/2X4 (GAUZE/BANDAGES/DRESSINGS) ×2 IMPLANT
COVER SURGICAL LIGHT HANDLE (MISCELLANEOUS) ×3 IMPLANT
DRAPE IMP U-DRAPE 54X76 (DRAPES) IMPLANT
DRAPE INCISE IOBAN 66X45 STRL (DRAPES) ×3 IMPLANT
DRAPE ORTHO SPLIT 77X108 STRL (DRAPES) ×4
DRAPE SURG ORHT 6 SPLT 77X108 (DRAPES) ×2 IMPLANT
DRAPE U-SHAPE 47X51 STRL (DRAPES) ×3 IMPLANT
DRSG ADAPTIC 3X8 NADH LF (GAUZE/BANDAGES/DRESSINGS) ×3 IMPLANT
DRSG PAD ABDOMINAL 8X10 ST (GAUZE/BANDAGES/DRESSINGS) ×3 IMPLANT
DURAPREP 26ML APPLICATOR (WOUND CARE) ×3 IMPLANT
ELECT BLADE 4.0 EZ CLEAN MEGAD (MISCELLANEOUS) ×3
ELECT NEEDLE TIP 2.8 STRL (NEEDLE) ×3 IMPLANT
ELECT REM PT RETURN 9FT ADLT (ELECTROSURGICAL) ×3
ELECTRODE BLDE 4.0 EZ CLN MEGD (MISCELLANEOUS) ×1 IMPLANT
ELECTRODE REM PT RTRN 9FT ADLT (ELECTROSURGICAL) ×1 IMPLANT
GAUZE SPONGE 4X4 12PLY STRL (GAUZE/BANDAGES/DRESSINGS) ×3 IMPLANT
GAUZE SPONGE 4X4 12PLY STRL LF (GAUZE/BANDAGES/DRESSINGS) ×3 IMPLANT
GLOVE BIO SURGEON STRL SZ7.5 (GLOVE) ×3 IMPLANT
GLOVE BIOGEL PI IND STRL 7.0 (GLOVE) ×2 IMPLANT
GLOVE BIOGEL PI INDICATOR 7.0 (GLOVE) ×4
GLOVE BIOGEL PI ORTHO PRO 7.5 (GLOVE) ×4
GLOVE BIOGEL PI ORTHO PRO SZ8 (GLOVE) ×4
GLOVE ORTHO TXT STRL SZ7.5 (GLOVE) ×3 IMPLANT
GLOVE PI ORTHO PRO STRL 7.5 (GLOVE) ×2 IMPLANT
GLOVE PI ORTHO PRO STRL SZ8 (GLOVE) ×2 IMPLANT
GLOVE SURG ORTHO 8.5 STRL (GLOVE) ×3 IMPLANT
GOWN STRL REUS W/ TWL LRG LVL3 (GOWN DISPOSABLE) ×2 IMPLANT
GOWN STRL REUS W/ TWL XL LVL3 (GOWN DISPOSABLE) ×3 IMPLANT
GOWN STRL REUS W/TWL LRG LVL3 (GOWN DISPOSABLE) ×4
GOWN STRL REUS W/TWL XL LVL3 (GOWN DISPOSABLE) ×6
KIT BASIN OR (CUSTOM PROCEDURE TRAY) ×3 IMPLANT
KIT ROOM TURNOVER OR (KITS) ×3 IMPLANT
MANIFOLD NEPTUNE II (INSTRUMENTS) ×3 IMPLANT
NEEDLE 1/2 CIR MAYO (NEEDLE) IMPLANT
NEEDLE HYPO 25GX1X1/2 BEV (NEEDLE) ×3 IMPLANT
NS IRRIG 1000ML POUR BTL (IV SOLUTION) ×3 IMPLANT
PACK SHOULDER (CUSTOM PROCEDURE TRAY) ×3 IMPLANT
PAD ABD 8X10 STRL (GAUZE/BANDAGES/DRESSINGS) ×3 IMPLANT
PAD ARMBOARD 7.5X6 YLW CONV (MISCELLANEOUS) ×6 IMPLANT
PIN GUIDE 1.2 (PIN) IMPLANT
PIN GUIDE GLENOPHERE 1.5MX300M (PIN) IMPLANT
PIN METAGLENE 2.5 (PIN) IMPLANT
SLING ARM FOAM STRAP LRG (SOFTGOODS) ×3 IMPLANT
SLING ARM FOAM STRAP MED (SOFTGOODS) IMPLANT
SPONGE LAP 18X18 X RAY DECT (DISPOSABLE) IMPLANT
SPONGE LAP 4X18 X RAY DECT (DISPOSABLE) IMPLANT
STRIP CLOSURE SKIN 1/2X4 (GAUZE/BANDAGES/DRESSINGS) ×2 IMPLANT
SUCTION FRAZIER HANDLE 10FR (MISCELLANEOUS) ×2
SUCTION TUBE FRAZIER 10FR DISP (MISCELLANEOUS) ×1 IMPLANT
SUT FIBERWIRE #2 38 T-5 BLUE (SUTURE) ×6
SUT MNCRL AB 4-0 PS2 18 (SUTURE) ×3 IMPLANT
SUT VIC AB 0 CT2 27 (SUTURE) ×3 IMPLANT
SUT VIC AB 2-0 CT1 27 (SUTURE) ×6
SUT VIC AB 2-0 CT1 TAPERPNT 27 (SUTURE) ×3 IMPLANT
SUT VICRYL 0 CT 1 36IN (SUTURE) ×3 IMPLANT
SUTURE FIBERWR #2 38 T-5 BLUE (SUTURE) ×2 IMPLANT
SYR CONTROL 10ML LL (SYRINGE) ×3 IMPLANT
TAPE CLOTH SURG 4X10 WHT LF (GAUZE/BANDAGES/DRESSINGS) ×3 IMPLANT
TAPE CLOTH SURG 6X10 WHT LF (GAUZE/BANDAGES/DRESSINGS) ×3 IMPLANT
TOWEL OR 17X24 6PK STRL BLUE (TOWEL DISPOSABLE) ×3 IMPLANT
TOWEL OR 17X26 10 PK STRL BLUE (TOWEL DISPOSABLE) ×3 IMPLANT
TOWER CARTRIDGE SMART MIX (DISPOSABLE) IMPLANT
WATER STERILE IRR 1000ML POUR (IV SOLUTION) IMPLANT
YANKAUER SUCT BULB TIP NO VENT (SUCTIONS) ×3 IMPLANT

## 2016-08-15 NOTE — Anesthesia Procedure Notes (Signed)
Procedure Name: Intubation Date/Time: 08/15/2016 10:15 AM Performed by: Rush Farmer E Pre-anesthesia Checklist: Patient identified, Emergency Drugs available, Suction available and Patient being monitored Patient Re-evaluated:Patient Re-evaluated prior to inductionOxygen Delivery Method: Circle system utilized Preoxygenation: Pre-oxygenation with 100% oxygen Intubation Type: IV induction Ventilation: Mask ventilation without difficulty Laryngoscope Size: Mac and 3 Grade View: Grade I Tube type: Oral Tube size: 7.0 mm Number of attempts: 1 Airway Equipment and Method: Stylet Placement Confirmation: ETT inserted through vocal cords under direct vision,  positive ETCO2 and breath sounds checked- equal and bilateral Secured at: 20 cm Tube secured with: Tape Dental Injury: Teeth and Oropharynx as per pre-operative assessment

## 2016-08-15 NOTE — Anesthesia Preprocedure Evaluation (Addendum)
Anesthesia Evaluation  Patient identified by MRN, date of birth, ID band Patient awake    Reviewed: Allergy & Precautions, NPO status , Patient's Chart, lab work & pertinent test results, reviewed documented beta blocker date and time   Airway Mallampati: III  TM Distance: >3 FB Neck ROM: Full    Dental  (+) Missing, Partial Lower, Edentulous Upper   Pulmonary neg pulmonary ROS,    Pulmonary exam normal breath sounds clear to auscultation       Cardiovascular hypertension, Pt. on medications and Pt. on home beta blockers Normal cardiovascular exam Rhythm:Regular Rate:Normal     Neuro/Psych Anxiety negative neurological ROS     GI/Hepatic negative GI ROS, Neg liver ROS,   Endo/Other  diabetes, Well ControlledHypothyroidism Morbid obesity  Renal/GU Renal InsufficiencyRenal disease  negative genitourinary   Musculoskeletal  (+) Arthritis , DJD left shouder Torn Rotator cuff left shoulder   Abdominal (+) + obese,   Peds  Hematology   Anesthesia Other Findings   Reproductive/Obstetrics                             Anesthesia Physical Anesthesia Plan  ASA: III  Anesthesia Plan: General   Post-op Pain Management:  Regional for Post-op pain   Induction:   PONV Risk Score and Plan: 4 or greater and Ondansetron, Dexamethasone, Propofol, Midazolam and Treatment may vary due to age  Airway Management Planned: Oral ETT  Additional Equipment:   Intra-op Plan:   Post-operative Plan: Extubation in OR  Informed Consent: I have reviewed the patients History and Physical, chart, labs and discussed the procedure including the risks, benefits and alternatives for the proposed anesthesia with the patient or authorized representative who has indicated his/her understanding and acceptance.     Plan Discussed with: CRNA, Anesthesiologist and Surgeon  Anesthesia Plan Comments:          Anesthesia Quick Evaluation

## 2016-08-15 NOTE — Brief Op Note (Signed)
08/15/2016  12:50 PM  PATIENT:  Phineas Semen  76 y.o. female  PRE-OPERATIVE DIAGNOSIS:  Left shoulder osteoarthritis, rotator cuff insufficiency  POST-OPERATIVE DIAGNOSIS:  Left shoulder osteoarthritis, rotator cuff insufficiency  PROCEDURE:  Procedure(s): REVERSE LEFT SHOULDER ARTHROPLASTY (Left) DePuy Delta Xtend  SURGEON:  Surgeon(s) and Role:    Netta Cedars, MD - Primary  PHYSICIAN ASSISTANT:   ASSISTANTS: Narda Amber, PA-C   ANESTHESIA:   regional and general  EBL:  Total I/O In: 700 [I.V.:700] Out: 150 [Blood:150]  BLOOD ADMINISTERED:none  DRAINS: none   LOCAL MEDICATIONS USED:  MARCAINE     SPECIMEN:  No Specimen  DISPOSITION OF SPECIMEN:  N/A  COUNTS:  YES  TOURNIQUET:  * No tourniquets in log *  DICTATION: .Other Dictation: Dictation Number [id number]  PLAN OF CARE: Admit to inpatient   PATIENT DISPOSITION:  PACU - hemodynamically stable.   Delay start of Pharmacological VTE agent (>24hrs) due to surgical blood loss or risk of bleeding: no

## 2016-08-15 NOTE — Anesthesia Postprocedure Evaluation (Signed)
Anesthesia Post Note  Patient: [name]  Procedure(s) Performed: Procedure(s) (LRB): REVERSE LEFT SHOULDER ARTHROPLASTY (Left)     Patient location during evaluation: PACU Anesthesia Type: General Level of consciousness: awake and alert Pain management: pain level controlled Vital Signs Assessment: post-procedure vital signs reviewed and stable Respiratory status: spontaneous breathing, nonlabored ventilation, respiratory function stable and patient connected to nasal cannula oxygen Cardiovascular status: blood pressure returned to baseline and stable Postop Assessment: no signs of nausea or vomiting Anesthetic complications: no    Last Vitals:  Vitals:   08/15/16 1329 08/15/16 1343  BP: 114/62 119/69  Pulse: 64 64  Resp: 13 14  Temp:      Last Pain:  Vitals:   08/15/16 1329  PainSc: Asleep                 Oshay Stranahan A.

## 2016-08-15 NOTE — Op Note (Signed)
[name], [name]                 ACCOUNT NO.:  [id number]  MEDICAL RECORD NO.:  [id number]  LOCATION:                                 FACILITY:  PHYSICIAN:  Doran Heater. Veverly Fells, M.D.      DATE OF BIRTH:  DATE OF PROCEDURE:  08/15/2016 DATE OF DISCHARGE:                              OPERATIVE REPORT   PREOPERATIVE DIAGNOSIS:  Left shoulder end-stage osteoarthritis and rotator cuff insufficiency.  POSTOPERATIVE DIAGNOSIS:  Left shoulder end-stage osteoarthritis and rotator cuff insufficiency.  PROCEDURE PERFORMED:  Left reverse total shoulder arthroplasty using DePuy Delta Xtend prosthesis.  ATTENDING SURGEON:  Doran Heater. Veverly Fells, MD.  ASSISTANT:  Narda Amber, PAC.  ANESTHESIA:  General anesthesia was used plus interscalene block.  ESTIMATED BLOOD LOSS:  150 mL.  FLUID REPLACEMENT:  1500 mL crystalloid.  INSTRUMENT COUNTS:  Correct.  COMPLICATIONS:  No complications.  Perioperative antibiotics were given.  INDICATIONS:  The patient is a 76 year old female with a history of worsening left shoulder pain secondary to rotator cuff tear and arthropathy.  The patient has had progressive arthritis despite conservative management, presents with profound functional loss and worsening pain, desiring total shoulder arthroplasty to remove pain and to restore function.  Informed consent was obtained.  DESCRIPTION OF PROCEDURE:  After adequate level of anesthesia was achieved, the patient was positioned in the modified beach-chair position.  Left shoulder correctly identified, sterilely prepped and draped in the usual manner.  Time-out called.  We entered the shoulder using standard deltopectoral incision, started at the coracoid process extending down to the anterior humerus.  Dissection was down through the subcutaneous tissues using Bovie.  Identified the cephalic vein, took it laterally, the deltoid and pectoralis taken medially.  Conjoint tendon was identified and retracted  medially.  We identified the biceps tendon within the bicipital groove and tenodesed that in situ by suturing with 0 Vicryl figure-of-eight suture in to the pectoralis tendon.  We released the subscapularis subperiosteally off the lesser tuberosity and we tagged the remnant likely for resection and not repair based on how tight that tissue was and it was not really that flexible.  We then went ahead and progressively externally rotated, released the inferior capsule off the neck of the humerus.  At this point, we went ahead and extended the shoulder.  We continued to tenotomize the biceps right at the joint line and also release the remnant of the supraspinatus and infraspinatus.  Teres minor was preserved.  Upon extension of the shoulder and delivery of the wound, we entered the proximal humerus using a 6 mm reamer.  We reamed up to a size 10, encountering some good resistance with the reamer on the size 10, but able to get all the way down.  We placed our size 10 intramedullary guide for resecting the head and then resected the head at 10 degrees of retroversion with the oscillating saw.  We removed excess osteophytes medially with a rongeur back to native bone.  We then prepared our metaphysis with the metaphyseal reaming guide.  We placed that in place for the epi-1 left metaphyseal reamer.  We then reamed and then placed our prosthesis in place  of 10 body epi-1 left stem with 0 setting and placed in 10 degrees of retroversion.  Once we had that in place, we retracted the humerus posteriorly and did a 360 degree capsule labral excision.  We were able to expose that glenoid face 360 degrees.  We identified the inferior scapular neck for orientation.  We placed our center guide pin and reamed for the metaglene and then drilled our central peg hole for the metaglene and impacting that in position.  Once the base plate was seated, it had good bony support.  We placed a 42 screw inferiorly  and a 30 proximally in the base of the coracoid.  We had excellent purchase with the screws, very secure fixation.  We then placed a 38 eccentric glenosphere.  We dialed the centricity inferiorly and impacted and screwed the glenosphere in place.  We then trialed with a 38 +3 poly, which gave [place] excellent stability, had no gapping at all with an inferior pole with external rotation and the conjoined was tight.  We removed the trial components from the humeral side, irrigated thoroughly, and then used a little bit of cement as the patient had a little bit of bone loss proximally right around the implant which felt like a bone was to me on the humeral side a little bit weaker.  So we did use a small amount of cement and placed in the metaphyseal area.  We irrigated the canal thoroughly, dried it and then put the small amount of cement up there for provisional fixation leaving a lot of the HA coating directly against the cancellous bone.  This hybrid technique we used again for stability where we had some concerns about initial fixation or initial stability with the implant bone.  We impacted in that position, held it and went ahead and placed a 38 +3 poly in place and then reduced the shoulder.  We were very pleased with the stability, with again no gapping with inferior pole or external rotation or any range of motion at all.  We irrigated thoroughly, checked the axillary nerve was not under too much tension and then we resected the remnant of the subscap and then closed the deltopectoral interval with 0 Vicryl suture followed by 2-0 Vicryl for subcutaneous closure, 4-0 Monocryl for skin.  Steri-Strips applied followed by sterile dressing.  The patient tolerated the surgery well.     Doran Heater. Veverly Fells, M.D.   ______________________________ Doran Heater. Veverly Fells, M.D.    SRN/MEDQ  D:  08/15/2016  T:  08/15/2016  Job:  [id number]

## 2016-08-15 NOTE — Anesthesia Procedure Notes (Signed)
Anesthesia Regional Block: Interscalene brachial plexus block   Pre-Anesthetic Checklist: ,, timeout performed, Correct Patient, Correct Site, Correct Laterality, Correct Procedure, Correct Position, site marked, Risks and benefits discussed,  Surgical consent,  Pre-op evaluation,  At surgeon's request and post-op pain management  Laterality: Left  Prep: chloraprep       Needles:  Injection technique: Single-shot  Needle Type: Echogenic Stimulator Needle     Needle Length: 9cm  Needle Gauge: 21   Needle insertion depth: 4 cm   Additional Needles:   Procedures: ultrasound guided,,,,,,,,  Narrative:  Start time: 08/15/2016 8:50 AM End time: 08/15/2016 9:55 AM Injection made incrementally with aspirations every 5 mL.  Performed by: Personally  Anesthesiologist: Josephine Igo  Additional Notes: Timeout performed. Patient sedated. Relevant anatomy ID'd using [place]. Incremental 2-5 ml injection of LA with frequent aspiration. Patient tolerated procedure well.

## 2016-08-15 NOTE — Transfer of Care (Signed)
Immediate Anesthesia Transfer of Care Note  Patient: [name]  Procedure(s) Performed: Procedure(s): REVERSE LEFT SHOULDER ARTHROPLASTY (Left)  Patient Location: PACU  Anesthesia Type:GA combined with regional for post-op pain  Level of Consciousness: awake, alert  and oriented  Airway & Oxygen Therapy: Patient Spontanous Breathing and Patient connected to nasal cannula oxygen  Post-op Assessment: Report given to RN, Post -op Vital signs reviewed and stable and Patient moving all extremities  Post vital signs: Reviewed and stable  Last Vitals:  Vitals:   08/15/16 0756 08/15/16 1241  BP: (!) 154/79   Pulse: 64   Resp: 18   Temp: 36.7 C (P) 36.6 C    Last Pain: There were no vitals filed for this visit.    Patients Stated Pain Goal: 4 (20/35/59 7416)  Complications: No apparent anesthesia complications

## 2016-08-15 NOTE — Interval H&P Note (Signed)
History and Physical Interval Note:  08/15/2016 9:42 AM  [name]  has presented today for surgery, with the diagnosis of Left shoulder osteoarthritis, rotator cuff insufficiency  The various methods of treatment have been discussed with the patient and family. After consideration of risks, benefits and other options for treatment, the patient has consented to  Procedure(s): REVERSE LEFT SHOULDER ARTHROPLASTY (Left) as a surgical intervention .  The patient's history has been reviewed, patient examined, no change in status, stable for surgery.  I have reviewed the patient's chart and labs.  Questions were answered to the patient's satisfaction.     Anita Mcadory,STEVEN R

## 2016-08-16 LAB — BASIC METABOLIC PANEL
ANION GAP: 9 (ref 5–15)
BUN: 16 mg/dL (ref 6–20)
CHLORIDE: 103 mmol/L (ref 101–111)
CO2: 24 mmol/L (ref 22–32)
Calcium: 8.6 mg/dL — ABNORMAL LOW (ref 8.9–10.3)
Creatinine, Ser: 1.1 mg/dL — ABNORMAL HIGH (ref 0.44–1.00)
GFR calc Af Amer: 55 mL/min — ABNORMAL LOW (ref >60)
GFR calc non Af Amer: 48 mL/min — ABNORMAL LOW (ref >60)
GLUCOSE: 145 mg/dL — AB (ref 65–99)
POTASSIUM: 4.6 mmol/L (ref 3.5–5.1)
Sodium: 136 mmol/L (ref 135–145)

## 2016-08-16 LAB — HEMOGLOBIN AND HEMATOCRIT, BLOOD
HCT: 33 % — ABNORMAL LOW (ref 36.0–46.0)
Hemoglobin: 11 g/dL — ABNORMAL LOW (ref 12.0–15.0)

## 2016-08-16 NOTE — Discharge Summary (Signed)
Physician Discharge Summary   Patient ID: [name] MRN: [id number] DOB/AGE: March 13, 1940 76 y.o.  Admit date: 08/15/2016 Discharge date: 08/16/2016  Admission Diagnoses:  Active Problems:   S/P shoulder replacement, left   Discharge Diagnoses:  Same   Surgeries: Procedure(s): REVERSE LEFT SHOULDER ARTHROPLASTY on 08/15/2016   Consultants: OT  Discharged Condition: Stable  Hospital Course: [name] is an 76 y.o. female who was admitted 08/15/2016 with a chief complaint of left shoulder pain, and found to have a diagnosis of left shoulder OA, primary, end stage with rotator cuff insufficency.  They were brought to the operating room on 08/15/2016 and underwent the above named procedures.    The patient had an uncomplicated hospital course and was stable for discharge.  Recent vital signs:  Vitals:   08/16/16 0122 08/16/16 0513  BP: 105/70 119/64  Pulse: 63 64  Resp: 18 18  Temp: 97.7 F (36.5 C) 97.7 F (36.5 C)    Recent laboratory studies:  Results for orders placed or performed during the hospital encounter of 08/15/16  Glucose, capillary  Result Value Ref Range   Glucose-Capillary 124 (H) 65 - 99 mg/dL   Comment 1 Notify RN    Comment 2 Document in Chart   Hemoglobin and hematocrit, blood  Result Value Ref Range   Hemoglobin 11.0 (L) 12.0 - 15.0 g/dL   HCT 33.0 (L) 36.0 - 81.0 %  Basic metabolic panel  Result Value Ref Range   Sodium 136 135 - 145 mmol/L   Potassium 4.6 3.5 - 5.1 mmol/L   Chloride 103 101 - 111 mmol/L   CO2 24 22 - 32 mmol/L   Glucose, Bld 145 (H) 65 - 99 mg/dL   BUN 16 6 - 20 mg/dL   Creatinine, Ser 1.10 (H) 0.44 - 1.00 mg/dL   Calcium 8.6 (L) 8.9 - 10.3 mg/dL   GFR calc non Af Amer 48 (L) >60 mL/min   GFR calc Af Amer 55 (L) >60 mL/min   Anion gap 9 5 - 15    Discharge Medications:   Allergies as of 08/16/2016      Reactions   Prinivil [lisinopril] Cough      Medication List    STOP taking these medications   ALPRAZolam 0.5 MG  dissolvable tablet Commonly known as:  NIRAVAM     TAKE these medications   amLODipine 5 MG tablet Commonly known as:  NORVASC Take 1 tablet (5 mg total) by mouth daily.   aspirin EC 81 MG tablet Take 81 mg by mouth daily.   furosemide 40 MG tablet Commonly known as:  LASIX Take 1 tablet (40 mg total) by mouth daily.   HYDROcodone-acetaminophen 5-325 MG tablet Commonly known as:  NORCO Take 1-2 tablets by mouth every 6 (six) hours as needed for moderate pain.   hydrocortisone 2.5 % cream Apply topically 2 (two) times daily. What changed:  how much to take  when to take this  reasons to take this   ketoconazole 2 % cream Commonly known as:  NIZORAL Apply under breasts daily for itching What changed:  how much to take  how to take this  when to take this  reasons to take this  additional instructions   levothyroxine 75 MCG tablet Commonly known as:  SYNTHROID, LEVOTHROID Take 1 tablet (75 mcg total) by mouth daily before breakfast.   metoprolol succinate 25 MG 24 hr tablet Commonly known as:  TOPROL-XL Take 1 tablet (25 mg total) by mouth  daily.   multivitamin with minerals tablet Take 1 tablet by mouth daily.   potassium chloride 10 MEQ tablet Commonly known as:  K-DUR Take 1 tablet (10 mEq total) by mouth daily.   simvastatin 10 MG tablet Commonly known as:  ZOCOR Take 1 tablet (10 mg total) by mouth every evening.   traMADol 50 MG tablet Commonly known as:  ULTRAM Take 50 mg by mouth 3 (three) times daily as needed (pain).       Diagnostic Studies: Dg Shoulder Left Port  Result Date: 08/15/2016 CLINICAL DATA:  76 year old female status post left shoulder replacement EXAM: LEFT SHOULDER - 1 VIEW COMPARISON:  None. FINDINGS: Surgical changes of left shoulder replacement. Expected postprocedural subcutaneous emphysema. No evidence of immediate hardware complication. Very low inspiratory volumes. IMPRESSION: Surgical changes of left shoulder  replacement without evidence of immediate hardware complication. Electronically Signed   By: Jacqulynn Cadet M.D.   On: 08/15/2016 14:37    Disposition: 06-Home-Health Care Svc  Discharge Instructions    Call MD / Call 911    Complete by:  As directed    If you experience chest pain or shortness of breath, CALL 911 and be transported to the hospital emergency room.  If you develope a fever above 101 F, pus (white drainage) or increased drainage or redness at the wound, or calf pain, call your surgeon's office.   Constipation Prevention    Complete by:  As directed    Drink plenty of fluids.  Prune juice may be helpful.  You may use a stool softener, such as Colace (over the counter) 100 mg twice a day.  Use MiraLax (over the counter) for constipation as needed.   Diet - low sodium heart healthy    Complete by:  As directed    Driving restrictions    Complete by:  As directed    No driving for 3 weeks   Increase activity slowly as tolerated    Complete by:  As directed       Follow-up Information    Netta Cedars, MD. Call in 2 week(s).   Specialty:  Orthopedic Surgery Why:  [phone] Contact information: [address] [place] [address] [phone]            Signed: Augustin Schooling 08/16/2016, 9:39 AM

## 2016-08-16 NOTE — Evaluation (Signed)
Occupational Therapy Evaluation Patient Details Name: [name] MRN: [id number] DOB: [birth year] Today's Date: 08/16/2016    History of Present Illness Pt is a 76 y.o. female s/p L reverse TSA. She has a PMH significant for anxiety, arthritis, back pain, diabetes mellitus, hyperlipidemia, hypertension, hypothyroidism, renal insufficiency, rotator cuff disorder, and thyroid disease.   Clinical Impression   PTA, pt was able to complete all ADL with modified independence incorporating compensatory strategies due to L shoulder pain. Pt currently requires min assist for UB dressing tasks to don/doff sling and supervision overall with all other ADL. Educated pt and daughter concerning active shoulder protocol with handouts provided including HEP (as indicated in OT orders), compensatory strategies for UB dressing and bathing tasks, NWB status of L UE, and sling wear schedule. Pt and daughter demonstrate understanding of all topics. Pt will have 24 hour assistance from family post-acute D/C. OT will continue to follow while admitted to improve independence with ADL and to continue skilled education concerning HEP. Recommend follow-up therapy as arranged by surgeon.     Follow Up Recommendations  DC plan and follow up therapy as arranged by surgeon    Equipment Recommendations  None recommended by OT    Recommendations for Other Services       Precautions / Restrictions Precautions Precautions: Shoulder Type of Shoulder Precautions: Active protocol: sling for comfort, NWB L UE, AROM elbow/wrist/hand, AROM shoulder (forward flexion: 0-90, abduction: 0-60; external rotation: 0-30) Shoulder Interventions: Shoulder sling/immobilizer;For comfort (for comfort and sleep) Precaution Booklet Issued: Yes (comment) Precaution Comments: Reviewed shoulder precautions in detail with pt and daughter. Required Braces or Orthoses: Sling (L shoulder sling) Restrictions Weight Bearing Restrictions: Yes LUE  Weight Bearing: Non weight bearing      Mobility Bed Mobility Overal bed mobility: Needs Assistance Bed Mobility: Supine to Sit     Supine to sit: Supervision     General bed mobility comments: No physical assist required. Supervision for VC's for shoulder precautions.   Transfers Overall transfer level: Needs assistance Equipment used: None Transfers: Sit to/from Stand Sit to Stand: Supervision              Balance Overall balance assessment: No apparent balance deficits (not formally assessed)                                         ADL either performed or assessed with clinical judgement   ADL Overall ADL's : Needs assistance/impaired Eating/Feeding: Set up;Sitting   Grooming: Set up;Sitting   Upper Body Bathing: Supervision/ safety;Sitting   Lower Body Bathing: Supervison/ safety;Sit to/from stand   Upper Body Dressing : Sitting;Minimal assistance Upper Body Dressing Details (indicate cue type and reason): Min assist for sling Lower Body Dressing: Supervision/safety;Sit to/from stand   Toilet Transfer: Supervision/safety;Ambulation;BSC (BSC over toilet)   Toileting- Clothing Manipulation and Hygiene: Supervision/safety;Sit to/from stand   Tub/ Shower Transfer: Supervision/safety;Ambulation;Shower seat   Functional mobility during ADLs: Supervision/safety General ADL Comments: Pt educated on compensatory strategies for ADL to adhere to active shoulder protocol precautions. She was able to complete all with supervision.      Vision Patient Visual Report: No change from baseline Vision Assessment?: No apparent visual deficits     Perception     Praxis      Pertinent Vitals/Pain Pain Assessment: 0-10 Pain Score: 8  (8 before exercise, 6 after exercise) Pain Location: L shoulder Pain  Descriptors / Indicators: Aching;Operative site guarding;Sore Pain Intervention(s): Limited activity within patient's tolerance;Monitored during  session;Repositioned     Hand Dominance Right   Extremity/Trunk Assessment Upper Extremity Assessment Upper Extremity Assessment: LUE deficits/detail LUE Deficits / Details: Decreased strength and ROM (within parameters of protocol) as expected post-operatively.    Lower Extremity Assessment Lower Extremity Assessment: Overall WFL for tasks assessed       Communication Communication Communication: No difficulties   Cognition Arousal/Alertness: Awake/alert Behavior During Therapy: WFL for tasks assessed/performed Overall Cognitive Status: Within Functional Limits for tasks assessed                                     General Comments       Exercises Exercises: Shoulder Shoulder Exercises Shoulder Flexion: AROM;Left;10 reps;Supine (0-90 only; able to complete ~0-60) Shoulder ABduction: AROM;Left;10 reps;Supine (0-60; able to achieve 0-60) Elbow Flexion: AROM;Left;10 reps;Standing Wrist Flexion: AROM;Left;10 reps;Seated Composite Extension: AROM;Left;10 reps;Seated   Shoulder Instructions Shoulder Instructions Donning/doffing shirt without moving shoulder: Supervision/safety Method for sponge bathing under operated UE: Supervision/safety Donning/doffing sling/immobilizer: Minimal assistance Correct positioning of sling/immobilizer: Supervision/safety ROM for elbow, wrist and digits of operated UE: Supervision/safety Sling wearing schedule (on at all times/off for ADL's): Supervision/safety Proper positioning of operated UE when showering: Supervision/safety Positioning of UE while sleeping: [place] expects to be discharged to:: Private residence Living Arrangements:  (staying at daughter's home) Available Help at Discharge: Family;Available 24 hours/day (sister primarily) Type of Home: House Home Access: Stairs to enter   Entrance Stairs-Rails: Right Home Layout: One level     Bathroom Shower/Tub: Emergency planning/management officer: Standard Bathroom Accessibility: Yes   Home Equipment: Shower seat;Hand held shower head;Grab bars - tub/shower          Prior Functioning/Environment Level of Independence: Independent        Comments: With compensatory strategies due to L shoulder pain        OT Problem List: Decreased strength;Decreased activity tolerance;Impaired balance (sitting and/or standing);Decreased safety awareness;Decreased knowledge of use of DME or AE;Decreased knowledge of precautions;Pain;Impaired UE functional use      OT Treatment/Interventions: Self-care/ADL training;Therapeutic exercise;Energy conservation;DME and/or AE instruction;Therapeutic activities;Patient/family education;Balance training    OT Goals(Current goals can be found in the care plan section) Acute Rehab OT Goals Patient Stated Goal: to go home soon OT Goal Formulation: With patient/family Time For Goal Achievement: 08/30/16 Potential to Achieve Goals: Good ADL Goals Pt Will Perform Grooming: with modified independence;standing Pt Will Perform Upper Body Dressing: with modified independence;sitting Pt Will Perform Lower Body Dressing: with modified independence;sit to/from stand Pt Will Transfer to Toilet: with modified independence;ambulating;bedside commode (BSC over toilet) Pt/caregiver will Perform Home Exercise Program: Increased ROM;Left upper extremity;With Supervision;With written HEP provided  OT Frequency: Min 1X/week   Barriers to D/C:            Co-evaluation              AM-PAC PT "6 Clicks" Daily Activity     Outcome Measure Help from another person eating meals?: A Little Help from another person taking care of personal grooming?: A Little Help from another person toileting, which includes using toliet, bedpan, or urinal?: A Little Help from another person bathing (including washing, rinsing, drying)?: A Little Help from another person to put on and taking off  regular upper body clothing?: A Little Help  from another person to put on and taking off regular lower body clothing?: A Little 6 Click Score: 18   End of Session Equipment Utilized During Treatment:  (L shoulder sling) Nurse Communication: Mobility status;Other (comment) (OT education complete)  Activity Tolerance: Patient tolerated treatment well Patient left: in bed;with call bell/phone within reach;with family/visitor present (sitting at EOB)  OT Visit Diagnosis: Pain Pain - Right/Left: Left Pain - part of body: Shoulder                Time: 0312-8118 OT Time Calculation (min): 29 min Charges:  OT General Charges $OT Visit: 1 Procedure OT Evaluation $OT Eval Moderate Complexity: 1 Procedure OT Treatments $Self Care/Home Management : 8-22 mins G-Codes:     Norman Herrlich, MS OTR/L  Pager: [place] 08/16/2016, 11:21 AM

## 2016-08-16 NOTE — Progress Notes (Signed)
Orthopedics Progress Note  Subjective: Feels good this morning, pain controlled  Objective:  Vitals:   08/16/16 0122 08/16/16 0513  BP: 105/70 119/64  Pulse: 63 64  Resp: 18 18  Temp: 97.7 F (36.5 C) 97.7 F (36.5 C)    General: Awake and alert  Musculoskeletal: dressing CDI left shoulder, compartments supple and wiggles fingers Neurovascularly intact  Lab Results  Component Value Date   WBC 4.7 08/01/2016   HGB 11.0 (L) 08/16/2016   HCT 33.0 (L) 08/16/2016   MCV 95.7 08/01/2016   PLT 240 08/01/2016       Component Value Date/Time   NA 136 08/16/2016 0408   K 4.6 08/16/2016 0408   CL 103 08/16/2016 0408   CO2 24 08/16/2016 0408   GLUCOSE 145 (H) 08/16/2016 0408   BUN 16 08/16/2016 0408   CREATININE 1.10 (H) 08/16/2016 0408   CREATININE 1.28 (H) 12/24/2015 1019   CALCIUM 8.6 (L) 08/16/2016 0408   GFRNONAA 48 (L) 08/16/2016 0408   GFRNONAA 52 (L) 06/18/2015 1104   GFRAA 55 (L) 08/16/2016 0408   GFRAA 60 06/18/2015 1104    Lab Results  Component Value Date   INR 0.96 07/09/2015   INR 1.6 (H) 10/12/2008   INR 1.3 10/11/2008    Assessment/Plan: POD #1 s/p Procedure(s): REVERSE LEFT SHOULDER ARTHROPLASTY Discharge to home after therapy  Follow up with me in two weeks in the office [place]. Veverly Fells, MD 08/16/2016 9:37 AM

## 2016-08-16 NOTE — Progress Notes (Signed)
Removed IV, discharge education provided, all questions and concerns addressed, Pt transported out via wheelchair to daughter's vehicle.

## 2016-08-16 NOTE — Discharge Instructions (Signed)
Ice to the shoulder as much as possible.  May remove the sling and use the left arm for light activity  Keep incision covered and clean and dry for one week, then ok to get it wet in the shower.if able to get gel bandage to stick and seal well then can shower with that in place. Gel bandages are changed every three days  Keep pillow or blanket propped behind the left elbow to keep arm across waist while seated.  Follow up in two weeks with Dr Veverly Fells [phone]

## 2016-08-18 ENCOUNTER — Encounter (HOSPITAL_COMMUNITY): Payer: Self-pay | Admitting: Orthopedic Surgery

## 2016-09-02 DIAGNOSIS — Z471 Aftercare following joint replacement surgery: Secondary | ICD-10-CM | POA: Diagnosis not present

## 2016-09-02 DIAGNOSIS — M19012 Primary osteoarthritis, left shoulder: Secondary | ICD-10-CM | POA: Diagnosis not present

## 2016-09-02 DIAGNOSIS — Z96612 Presence of left artificial shoulder joint: Secondary | ICD-10-CM | POA: Diagnosis not present

## 2016-09-02 DIAGNOSIS — M1712 Unilateral primary osteoarthritis, left knee: Secondary | ICD-10-CM | POA: Diagnosis not present

## 2016-09-22 DIAGNOSIS — H16222 Keratoconjunctivitis sicca, not specified as Sjogren's, left eye: Secondary | ICD-10-CM | POA: Diagnosis not present

## 2016-09-22 DIAGNOSIS — H25813 Combined forms of age-related cataract, bilateral: Secondary | ICD-10-CM | POA: Diagnosis not present

## 2016-09-22 DIAGNOSIS — H16223 Keratoconjunctivitis sicca, not specified as Sjogren's, bilateral: Secondary | ICD-10-CM | POA: Diagnosis not present

## 2016-09-22 DIAGNOSIS — H16221 Keratoconjunctivitis sicca, not specified as Sjogren's, right eye: Secondary | ICD-10-CM | POA: Diagnosis not present

## 2016-10-16 DIAGNOSIS — Z471 Aftercare following joint replacement surgery: Secondary | ICD-10-CM | POA: Diagnosis not present

## 2016-10-16 DIAGNOSIS — Z96612 Presence of left artificial shoulder joint: Secondary | ICD-10-CM | POA: Diagnosis not present

## 2016-10-20 DIAGNOSIS — H25811 Combined forms of age-related cataract, right eye: Secondary | ICD-10-CM | POA: Diagnosis not present

## 2016-10-29 DIAGNOSIS — I10 Essential (primary) hypertension: Secondary | ICD-10-CM | POA: Diagnosis not present

## 2016-10-29 DIAGNOSIS — H2511 Age-related nuclear cataract, right eye: Secondary | ICD-10-CM | POA: Diagnosis not present

## 2016-10-29 DIAGNOSIS — H269 Unspecified cataract: Secondary | ICD-10-CM | POA: Diagnosis not present

## 2016-10-29 DIAGNOSIS — H25811 Combined forms of age-related cataract, right eye: Secondary | ICD-10-CM | POA: Diagnosis not present

## 2016-10-29 DIAGNOSIS — E785 Hyperlipidemia, unspecified: Secondary | ICD-10-CM | POA: Diagnosis not present

## 2016-10-29 DIAGNOSIS — E039 Hypothyroidism, unspecified: Secondary | ICD-10-CM | POA: Diagnosis not present

## 2016-11-06 DIAGNOSIS — H25812 Combined forms of age-related cataract, left eye: Secondary | ICD-10-CM | POA: Diagnosis not present

## 2016-11-08 DIAGNOSIS — H269 Unspecified cataract: Secondary | ICD-10-CM

## 2016-11-08 HISTORY — DX: Unspecified cataract: H26.9

## 2016-11-08 HISTORY — PX: CATARACT EXTRACTION, BILATERAL: SHX1313

## 2016-11-14 ENCOUNTER — Telehealth: Payer: Self-pay | Admitting: *Deleted

## 2016-11-14 NOTE — Telephone Encounter (Signed)
Should be okay, unless when you see her you feel that her current medical condition warrants an office visit with me 1st  Thanks  JMP   Previous Messages    ----- Message -----  From: Laverna Peace, RN  Sent: 11/12/2016 11:59 AM  To: Jerene Bears, MD   Dr. Hilarie Fredrickson,   This pt is coming in for a PV next month for a screening colonoscopy. She is 76. Jayme Cloud is her daughter. I'm just making sure you're ok with proceeding, since we are supposed to run it by you in PV if the pt is 76 or older and this is their first colonoscopy.    Thanks,  [employer]

## 2016-11-17 DIAGNOSIS — E079 Disorder of thyroid, unspecified: Secondary | ICD-10-CM | POA: Diagnosis not present

## 2016-11-17 DIAGNOSIS — H25812 Combined forms of age-related cataract, left eye: Secondary | ICD-10-CM | POA: Diagnosis not present

## 2016-11-17 DIAGNOSIS — I10 Essential (primary) hypertension: Secondary | ICD-10-CM | POA: Diagnosis not present

## 2016-11-17 DIAGNOSIS — H2512 Age-related nuclear cataract, left eye: Secondary | ICD-10-CM | POA: Diagnosis not present

## 2016-11-17 DIAGNOSIS — H269 Unspecified cataract: Secondary | ICD-10-CM | POA: Diagnosis not present

## 2016-11-17 DIAGNOSIS — E7800 Pure hypercholesterolemia, unspecified: Secondary | ICD-10-CM | POA: Diagnosis not present

## 2016-12-03 ENCOUNTER — Encounter: Payer: Medicare Other | Admitting: Internal Medicine

## 2016-12-11 ENCOUNTER — Ambulatory Visit (AMBULATORY_SURGERY_CENTER): Payer: Self-pay | Admitting: *Deleted

## 2016-12-11 ENCOUNTER — Other Ambulatory Visit: Payer: Self-pay | Admitting: Internal Medicine

## 2016-12-11 VITALS — Ht 63.0 in | Wt 240.0 lb

## 2016-12-11 DIAGNOSIS — Z1231 Encounter for screening mammogram for malignant neoplasm of breast: Secondary | ICD-10-CM

## 2016-12-11 DIAGNOSIS — Z1211 Encounter for screening for malignant neoplasm of colon: Secondary | ICD-10-CM

## 2016-12-11 DIAGNOSIS — M1711 Unilateral primary osteoarthritis, right knee: Secondary | ICD-10-CM | POA: Diagnosis not present

## 2016-12-11 MED ORDER — SUPREP BOWEL PREP KIT 17.5-3.13-1.6 GM/177ML PO SOLN: 1.0000 | Freq: Once | ORAL | 0 refills | Status: AC

## 2016-12-11 NOTE — Progress Notes (Signed)
Patient denies any allergies to egg or soy products. Patient denies complications with anesthesia/sedation.  Patient denies oxygen use at home and denies diet medications. Colonoscopy pamphlet given to patient.   

## 2016-12-18 ENCOUNTER — Ambulatory Visit (AMBULATORY_SURGERY_CENTER): Payer: Medicare Other | Admitting: Internal Medicine

## 2016-12-18 ENCOUNTER — Encounter: Payer: Self-pay | Admitting: Internal Medicine

## 2016-12-18 VITALS — BP 114/54 | HR 65 | Temp 97.3°F | Resp 16 | Ht 63.5 in | Wt 241.0 lb

## 2016-12-18 DIAGNOSIS — K635 Polyp of colon: Secondary | ICD-10-CM | POA: Diagnosis not present

## 2016-12-18 DIAGNOSIS — D123 Benign neoplasm of transverse colon: Secondary | ICD-10-CM

## 2016-12-18 DIAGNOSIS — Z1211 Encounter for screening for malignant neoplasm of colon: Secondary | ICD-10-CM | POA: Diagnosis not present

## 2016-12-18 DIAGNOSIS — D120 Benign neoplasm of cecum: Secondary | ICD-10-CM

## 2016-12-18 DIAGNOSIS — D126 Benign neoplasm of colon, unspecified: Secondary | ICD-10-CM

## 2016-12-18 MED ORDER — SODIUM CHLORIDE 0.9 % IV SOLN: 500.0000 mL | INTRAVENOUS | Status: DC

## 2016-12-18 NOTE — Op Note (Signed)
[facility] Patient Name: [name] Procedure Date: 12/18/2016 1:29 PM MRN: [id number] Endoscopist: Jerene Bears , MD Age: 76 Referring MD:  Date of Birth: [birth year] Gender: Female Account #: [id number] Procedure:                Colonoscopy Indications:              Screening for colorectal malignant neoplasm, This                            is the patient's first colonoscopy Medicines:                Monitored Anesthesia Care Procedure:                Pre-Anesthesia Assessment:                           - Prior to the procedure, a History and Physical                            was performed, and patient medications and                            allergies were reviewed. The patient's tolerance of                            previous anesthesia was also reviewed. The risks                            and benefits of the procedure and the sedation                            options and risks were discussed with the patient.                            All questions were answered, and informed consent                            was obtained. Prior Anticoagulants: The patient has                            taken no previous anticoagulant or antiplatelet                            agents. ASA Grade Assessment: III - A patient with                            severe systemic disease. After reviewing the risks                            and benefits, the patient was deemed in                            satisfactory condition to undergo the procedure.  After obtaining informed consent, the colonoscope                            was passed under direct vision. Throughout the                            procedure, the patient's blood pressure, pulse, and                            oxygen saturations were monitored continuously. The                            Colonoscope was introduced through the anus and                            advanced to the the cecum,  identified by                            appendiceal orifice and ileocecal valve. The                            colonoscopy was performed without difficulty. The                            patient tolerated the procedure well. The quality                            of the bowel preparation was good. The ileocecal                            valve, appendiceal orifice, and rectum were                            photographed. Scope In: 1:39:16 PM Scope Out: 1:56:21 PM Scope Withdrawal Time: 0 hours 11 minutes 49 seconds  Total Procedure Duration: 0 hours 17 minutes 5 seconds  Findings:                 The perianal and digital rectal examinations were                            normal.                           A 4 mm polyp was found in the cecum. The polyp was                            sessile. The polyp was removed with a cold snare.                            Resection and retrieval were complete.                           A 3 mm polyp was found in the transverse colon. The  polyp was sessile. The polyp was removed with a                            cold snare. Resection and retrieval were complete.                           Scattered small-mouthed diverticula were found in                            the sigmoid colon and descending colon.                           Internal hemorrhoids were found during                            retroflexion. The hemorrhoids were medium-sized. Complications:            No immediate complications. Estimated Blood Loss:     Estimated blood loss was minimal. Impression:               - One 4 mm polyp in the cecum, removed with a cold                            snare. Resected and retrieved.                           - One 3 mm polyp in the transverse colon, removed                            with a cold snare. Resected and retrieved.                           - Mild diverticulosis in the sigmoid colon and in                             the descending colon.                           - Internal hemorrhoids. Recommendation:           - Patient has a contact number available for                            emergencies. The signs and symptoms of potential                            delayed complications were discussed with the                            patient. Return to normal activities tomorrow.                            Written discharge instructions were provided to the  patient.                           - Resume previous diet.                           - Continue present medications.                           - Await pathology results.                           - No recommendation at this time regarding repeat                            colonoscopy due to age (34 years would be the                            soonest repeat interval). Jerene Bears, MD 12/18/2016 2:01:57 PM This report has been signed electronically.

## 2016-12-18 NOTE — Progress Notes (Signed)
Called to room to assist during endoscopic procedure.  Patient ID and intended procedure confirmed with present staff. Received instructions for my participation in the procedure from the performing physician.  

## 2016-12-18 NOTE — Patient Instructions (Signed)
Impression/Recommendations:  Polyp handout given to patient. Diverticulosis handout given to patient. Hemorrhoid handout given to patient.  Resume previous diet. Continue present medications.  Await pathology results.  YOU HAD AN ENDOSCOPIC PROCEDURE TODAY AT THE [facility]:   Refer to the procedure report that was given to you for any specific questions about what was found during the examination.  If the procedure report does not answer your questions, please call your gastroenterologist to clarify.  If you requested that your care partner not be given the details of your procedure findings, then the procedure report has been included in a sealed envelope for you to review at your convenience later.  YOU SHOULD EXPECT: Some feelings of bloating in the abdomen. Passage of more gas than usual.  Walking can help get rid of the air that was put into your GI tract during the procedure and reduce the bloating. If you had a lower endoscopy (such as a colonoscopy or flexible sigmoidoscopy) you may notice spotting of blood in your stool or on the toilet paper. If you underwent a bowel prep for your procedure, you may not have a normal bowel movement for a few days.  Please Note:  You might notice some irritation and congestion in your nose or some drainage.  This is from the oxygen used during your procedure.  There is no need for concern and it should clear up in a day or so.  SYMPTOMS TO REPORT IMMEDIATELY:   Following lower endoscopy (colonoscopy or flexible sigmoidoscopy):  Excessive amounts of blood in the stool  Significant tenderness or worsening of abdominal pains  Swelling of the abdomen that is new, acute  Fever of 100F or higher For urgent or emergent issues, a gastroenterologist can be reached at any hour by calling [phone].   DIET:  We do recommend a small meal at first, but then you may proceed to your regular diet.  Drink plenty of fluids but you should avoid  alcoholic beverages for 24 hours.  ACTIVITY:  You should plan to take it easy for the rest of today and you should NOT DRIVE or use heavy machinery until tomorrow (because of the sedation medicines used during the test).    FOLLOW UP: Our staff will call the number listed on your records the next business day following your procedure to check on you and address any questions or concerns that you may have regarding the information given to you following your procedure. If we do not reach you, we will leave a message.  However, if you are feeling well and you are not experiencing any problems, there is no need to return our call.  We will assume that you have returned to your regular daily activities without incident.  If any biopsies were taken you will be contacted by phone or by letter within the next 1-3 weeks.  Please call us at [phone] if you have not heard about the biopsies in 3 weeks.    SIGNATURES/CONFIDENTIALITY: You and/or your care partner have signed paperwork which will be entered into your electronic medical record.  These signatures attest to the fact that that the information above on your After Visit Summary has been reviewed and is understood.  Full responsibility of the confidentiality of this discharge information lies with you and/or your care-partner. 

## 2016-12-18 NOTE — Progress Notes (Signed)
A/ox3 pleased with MAC, report to Jane RN 

## 2016-12-19 ENCOUNTER — Telehealth: Payer: Self-pay

## 2016-12-19 NOTE — Telephone Encounter (Signed)
Attempted to reach patient for post-procedure f/u call. No answer. Left message that we will attempt to reach her again on Monday and for her to please not hesitate to call us if she has any questions/concerns regarding her care.

## 2016-12-22 ENCOUNTER — Telehealth: Payer: Self-pay | Admitting: *Deleted

## 2016-12-22 NOTE — Telephone Encounter (Signed)
  Follow up Call-  Call back number 12/18/2016  Post procedure Call Back phone  # [phone]  Permission to leave phone message Yes  Some recent data might be hidden     Patient questions:  Do you have a fever, pain , or abdominal swelling? No. Pain Score  0 *  Have you tolerated food without any problems? Yes.    Have you been able to return to your normal activities? Yes.    Do you have any questions about your discharge instructions: Diet   No. Medications  No. Follow up visit  No.  Do you have questions or concerns about your Care? No.  Actions: * If pain score is 4 or above: No action needed, pain <4.

## 2016-12-25 ENCOUNTER — Encounter: Payer: Self-pay | Admitting: Internal Medicine

## 2017-01-02 ENCOUNTER — Encounter: Payer: Self-pay | Admitting: Internal Medicine

## 2017-01-02 ENCOUNTER — Ambulatory Visit (INDEPENDENT_AMBULATORY_CARE_PROVIDER_SITE_OTHER): Payer: Medicare Other | Admitting: Internal Medicine

## 2017-01-02 ENCOUNTER — Ambulatory Visit
Admission: RE | Admit: 2017-01-02 | Discharge: 2017-01-02 | Disposition: A | Discharge status: Home / Self Care | Payer: Medicare Other | Source: Ambulatory Visit | Attending: Internal Medicine | Admitting: Internal Medicine

## 2017-01-02 VITALS — BP 128/80 | HR 69 | Temp 97.4°F | Ht 63.0 in | Wt 236.0 lb

## 2017-01-02 DIAGNOSIS — E785 Hyperlipidemia, unspecified: Secondary | ICD-10-CM

## 2017-01-02 DIAGNOSIS — Z96641 Presence of right artificial hip joint: Secondary | ICD-10-CM | POA: Diagnosis not present

## 2017-01-02 DIAGNOSIS — N183 Chronic kidney disease, stage 3 unspecified: Secondary | ICD-10-CM

## 2017-01-02 DIAGNOSIS — I10 Essential (primary) hypertension: Secondary | ICD-10-CM | POA: Diagnosis not present

## 2017-01-02 DIAGNOSIS — G8929 Other chronic pain: Secondary | ICD-10-CM | POA: Diagnosis not present

## 2017-01-02 DIAGNOSIS — D126 Benign neoplasm of colon, unspecified: Secondary | ICD-10-CM

## 2017-01-02 DIAGNOSIS — Z0000 Encounter for general adult medical examination without abnormal findings: Secondary | ICD-10-CM

## 2017-01-02 DIAGNOSIS — M545 Low back pain, unspecified: Secondary | ICD-10-CM

## 2017-01-02 DIAGNOSIS — Z96652 Presence of left artificial knee joint: Secondary | ICD-10-CM

## 2017-01-02 DIAGNOSIS — M1711 Unilateral primary osteoarthritis, right knee: Secondary | ICD-10-CM

## 2017-01-02 DIAGNOSIS — E039 Hypothyroidism, unspecified: Secondary | ICD-10-CM | POA: Diagnosis not present

## 2017-01-02 DIAGNOSIS — I159 Secondary hypertension, unspecified: Secondary | ICD-10-CM | POA: Diagnosis not present

## 2017-01-02 DIAGNOSIS — Z23 Encounter for immunization: Secondary | ICD-10-CM | POA: Diagnosis not present

## 2017-01-02 DIAGNOSIS — E8881 Metabolic syndrome: Secondary | ICD-10-CM | POA: Diagnosis not present

## 2017-01-02 DIAGNOSIS — E118 Type 2 diabetes mellitus with unspecified complications: Secondary | ICD-10-CM

## 2017-01-02 DIAGNOSIS — Z96612 Presence of left artificial shoulder joint: Secondary | ICD-10-CM

## 2017-01-02 DIAGNOSIS — Z1231 Encounter for screening mammogram for malignant neoplasm of breast: Secondary | ICD-10-CM

## 2017-01-02 MED ORDER — HYDROCODONE-ACETAMINOPHEN 5-325 MG PO TABS: 1.0000 | ORAL_TABLET | Freq: Four times a day (QID) | ORAL | 0 refills | Status: DC | PRN

## 2017-01-02 NOTE — Patient Instructions (Signed)
Lab work drawn and pending and to be reviewed.  Flu vaccine given.  Surgical clearance form signed.  Continue same medications.  Return in 6 months or as needed.  Small quantity of hydrocodone APAP for chronic back pain and knee pain.

## 2017-01-02 NOTE — Progress Notes (Signed)
Subjective:    Patient ID: [name], female    DOB: 1941-01-22, 76 y.o.   MRN: [id number]  HPI 76 year old Female in today for health maintenance exam, surgical clearance and evaluation of medical issues as well as Medicare wellness exam.  She has a history of right knee pain and plans to have a right knee arthroplasty in November.  Status post left knee arthroplasty in 2017.  Had left shoulder arthroplasty by Dr. Veverly Fells June 2018.  Just had colonoscopy by Dr. Hilarie Fredrickson October 11 with 4 mm polyp removed from the cecum.  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  Is to have mammogram today.Flu vaccine given  History of intertrigo, essential hypertension, hyperlipidemia, obesity, metabolic syndrome, controlled type 2 diabetes mellitus, hypothyroidism.  Diabetes is diet controlled at the present time.  No history of serious illnesses accidents or operations before I assumed her care in 2009.  She had a right hip arthroplasty 2010.   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  Resides alone.  Does not smoke or consume alcohol.  She owns her own home, continues to drive and does her own housework.  She completed high school.  She is married.  Husband is in a nursing home here in [place] with history of diabetes status post leg amputation.  Her daughter is a Marine scientist with  [employer] and resides here as well.  Family history: Father died at age 55 of prostate cancer.  Mother died at age 66 of stroke with history of hypertension.  One brother with history of lung cancer.  Patient has chronic kidney disease which is being observed.    Review of Systems complaining of low back pain as well as right knee pain.     Objective:   Physical Exam  Constitutional: She is oriented to person, place, and time. She appears well-developed and well-nourished. No distress.  Her blood pressure is elevated today but  she has not had blood pressure medications before coming to the office  HENT:  Head: Normocephalic and atraumatic.  Right Ear: External ear normal.  Left Ear: External ear normal.  Mouth/Throat: Oropharynx is clear and moist.  Eyes: Pupils are equal, round, and reactive to light. Conjunctivae and EOM are normal. Right eye exhibits no discharge. Left eye exhibits no discharge.  Neck: Neck supple. No JVD present. No thyromegaly present.  Cardiovascular: Normal rate and normal heart sounds.   No murmur heard. Pulmonary/Chest: Effort normal and breath sounds normal. No respiratory distress. She has no wheezes. She has no rales.  Breasts normal female without masses  Abdominal: Soft. Bowel sounds are normal. She exhibits no distension and no mass. There is no tenderness. There is no rebound and no guarding.  Genitourinary:  Genitourinary Comments: Deferred  Musculoskeletal: She exhibits no edema.  Neurological: She is alert and oriented to person, place, and time. She has normal reflexes.  Skin: Skin is warm and dry. No rash noted. She is not diaphoretic.  Psychiatric: She has a normal mood and affect. Her behavior is normal. Judgment and thought content normal.  Vitals reviewed.         Assessment & Plan:  Seen for right knee arthroplasty surgical clearance November 2018  History of left knee arthroplasty  History of shoulder arthroplasty  Right hip arthroplasty 2018  Essential hypertension blood pressure on arrival without taking medications 148/100.  She returned in the afternoon after  taking medications and blood pressure was 128/80.  Diet-controlled diabetes mellitus  Hyperlipidemia  Hypothyroidism  Essential hypertension  Metabolic syndrome  Obesity  Chronic kidney disease  Low back pain  Status post bilateral cataract extractions recently    Plan: Given small quantity of hydrocodone APAP for back and knee pain.  She will return in 6 months. Labs drawn and  pending. Form signed for ortho surgery.  Subjective:   Patient presents for Medicare Annual/Subsequent preventive examination.  Review Past Medical/Family/Social:see above   Risk Factors  Current exercise habits: active about house Dietary issues discussed: low fat low carb  Cardiac risk factors:HTN, Hyperlipidemia, DM , FHx stroke in mother  Depression Screen  (Note: if answer to either of the following is "Yes", a more complete depression screening is indicated)   Over the past two weeks, have you felt down, depressed or hopeless? No  Over the past two weeks, have you felt little interest or pleasure in doing things? No Have you lost interest or pleasure in daily life? No Do you often feel hopeless? No Do you cry easily over simple problems? No   Activities of Daily Living  In your present state of health, do you have any difficulty performing the following activities?:   Driving? No  Managing money? No  Feeding yourself? No  Getting from bed to chair? No  Climbing a flight of stairs? No  Preparing food and eating?: No  Bathing or showering? No  Getting dressed: No  Getting to the toilet? No  Using the toilet:No  Moving around from place to place: No  In the past year have you fallen or had a near fall?:yes Are you sexually active? No  Do you have more than one partner? No   Hearing Difficulties: No  Do you often ask people to speak up or repeat themselves? No  Do you experience ringing or noises in your ears? sometimes Do you have difficulty understanding soft or whispered voices? No  Do you feel that you have a problem with memory? No Do you often misplace items? No    Home Safety:  Do you have a smoke alarm at your residence? Yes Do you have grab bars in the bathroom?yes Do you have throw rugs in your house?yes   Cognitive Testing  Alert? Yes Normal Appearance?Yes  Oriented to person? Yes Place? Yes  Time? Yes  Recall of three objects? Yes  Can perform  simple calculations? Yes  Displays appropriate judgment?Yes  Can read the correct time from a watch face?Yes   List the Names of Other Physician/Practitioners you currently use:  See referral list for the physicians patient is currently seeing.     Review of Systems: see above   Objective:     General appearance: Appears stated age and  obese  Head: Normocephalic, without obvious abnormality, atraumatic  Eyes: conj clear, EOMi PEERLA  Ears: normal TM's and external ear canals both ears  Nose: Nares normal. Septum midline. Mucosa normal. No drainage or sinus tenderness.  Throat: lips, mucosa, and tongue normal; teeth and gums normal  Neck: no adenopathy, no carotid bruit, no JVD, supple, symmetrical, trachea midline and thyroid not enlarged, symmetric, no tenderness/mass/nodules  No CVA tenderness.  Lungs: clear to auscultation bilaterally  Breasts: normal appearance, no masses or tenderness Heart: regular rate and rhythm, S1, S2 normal, no murmur, click, rub or gallop  Abdomen: soft, non-tender; bowel sounds normal; no masses, no organomegaly  Musculoskeletal: ROM normal in all joints, no crepitus,  no deformity, Normal muscle strengthen. Back  is symmetric, no curvature. Skin: Skin color, texture, turgor normal. No rashes or lesions  Lymph nodes: Cervical, supraclavicular, and axillary nodes normal.  Neurologic: CN 2 -12 Normal, Normal symmetric reflexes. Normal coordination and gait  Psych: Alert & Oriented x 3, Mood appear stable.    Assessment:    Annual wellness medicare exam   Plan:    During the course of the visit the patient was educated and counseled about appropriate screening and preventive services including:   Annual mammogram  Colonoscopy up-to-date  Annual flu vaccine     Patient Instructions (the written plan) was given to the patient.  Medicare Attestation  I have personally reviewed:  The patient's medical and social history  Their use of  alcohol, tobacco or illicit drugs  Their current medications and supplements  The patient's functional ability including ADLs,fall risks, home safety risks, cognitive, and hearing and visual impairment  Diet and physical activities  Evidence for depression or mood disorders  The patient's weight, height, BMI, and visual acuity have been recorded in the chart. I have made referrals, counseling, and provided education to the patient based on review of the above and I have provided the patient with a written personalized care plan for preventive services.       Lab work drawn and pending

## 2017-01-03 LAB — CBC WITH DIFFERENTIAL/PLATELET
Basophils Absolute: 22 cells/uL (ref 0–200)
Basophils Relative: 0.6 %
Eosinophils Absolute: 50 cells/uL (ref 15–500)
Eosinophils Relative: 1.4 %
HEMATOCRIT: 38.4 % (ref 35.0–45.0)
Hemoglobin: 13.1 g/dL (ref 11.7–15.5)
LYMPHS ABS: 1404 {cells}/uL (ref 850–3900)
MCH: 31.2 pg (ref 27.0–33.0)
MCHC: 34.1 g/dL (ref 32.0–36.0)
MCV: 91.4 fL (ref 80.0–100.0)
MPV: 10.5 fL (ref 7.5–12.5)
Monocytes Relative: 12.4 %
NEUTROS PCT: 46.6 %
Neutro Abs: 1678 cells/uL (ref 1500–7800)
Platelets: 237 10*3/uL (ref 140–400)
RBC: 4.2 10*6/uL (ref 3.80–5.10)
RDW: 12.7 % (ref 11.0–15.0)
Total Lymphocyte: 39 %
WBC: 3.6 10*3/uL — AB (ref 3.8–10.8)
WBCMIX: 446 {cells}/uL (ref 200–950)

## 2017-01-03 LAB — COMPLETE METABOLIC PANEL WITH GFR
AG RATIO: 1.1 (calc) (ref 1.0–2.5)
ALT: 9 U/L (ref 6–29)
AST: 17 U/L (ref 10–35)
Albumin: 4 g/dL (ref 3.6–5.1)
Alkaline phosphatase (APISO): 70 U/L (ref 33–130)
BUN/Creatinine Ratio: 9 (calc) (ref 6–22)
BUN: 11 mg/dL (ref 7–25)
CALCIUM: 9.1 mg/dL (ref 8.6–10.4)
CHLORIDE: 101 mmol/L (ref 98–110)
CO2: 29 mmol/L (ref 20–32)
Creat: 1.23 mg/dL — ABNORMAL HIGH (ref 0.60–0.93)
GFR, EST AFRICAN AMERICAN: 49 mL/min/{1.73_m2} — AB (ref >=60)
GFR, EST NON AFRICAN AMERICAN: 43 mL/min/{1.73_m2} — AB (ref >=60)
GLOBULIN: 3.5 g/dL (ref 1.9–3.7)
Glucose, Bld: 90 mg/dL (ref 65–99)
POTASSIUM: 4.5 mmol/L (ref 3.5–5.3)
SODIUM: 139 mmol/L (ref 135–146)
TOTAL PROTEIN: 7.5 g/dL (ref 6.1–8.1)
Total Bilirubin: 0.6 mg/dL (ref 0.2–1.2)

## 2017-01-03 LAB — LIPID PANEL
CHOLESTEROL: 198 mg/dL (ref <200)
HDL: 60 mg/dL (ref >50)
LDL Cholesterol (Calc): 112 mg/dL (calc) — ABNORMAL HIGH
NON-HDL CHOLESTEROL (CALC): 138 mg/dL — AB (ref <130)
TRIGLYCERIDES: 143 mg/dL (ref <150)
Total CHOL/HDL Ratio: 3.3 (calc) (ref <5.0)

## 2017-01-03 LAB — MICROALBUMIN / CREATININE URINE RATIO
CREATININE, URINE: 297 mg/dL — AB (ref 20–275)
MICROALB UR: 1.6 mg/dL
Microalb Creat Ratio: 5 mcg/mg creat (ref <30)

## 2017-01-03 LAB — TSH: TSH: 2.83 mIU/L (ref 0.40–4.50)

## 2017-01-03 LAB — HEMOGLOBIN A1C
HEMOGLOBIN A1C: 5.7 %{Hb} — AB (ref <5.7)
Mean Plasma Glucose: 117 (calc)
eAG (mmol/L): 6.5 (calc)

## 2017-01-04 ENCOUNTER — Ambulatory Visit: Payer: Self-pay | Admitting: Orthopedic Surgery

## 2017-01-05 LAB — POCT URINALYSIS DIPSTICK
BILIRUBIN UA: NEGATIVE
Blood, UA: NEGATIVE
GLUCOSE UA: NEGATIVE
KETONES UA: NEGATIVE
Leukocytes, UA: NEGATIVE
Nitrite, UA: NEGATIVE
PROTEIN UA: NEGATIVE
SPEC GRAV UA: 1.025 (ref 1.010–1.025)
Urobilinogen, UA: 0.2 E.U./dL
pH, UA: 7 (ref 5.0–8.0)

## 2017-01-20 DIAGNOSIS — Z96612 Presence of left artificial shoulder joint: Secondary | ICD-10-CM | POA: Diagnosis not present

## 2017-01-20 DIAGNOSIS — M19012 Primary osteoarthritis, left shoulder: Secondary | ICD-10-CM | POA: Diagnosis not present

## 2017-01-20 DIAGNOSIS — Z471 Aftercare following joint replacement surgery: Secondary | ICD-10-CM | POA: Diagnosis not present

## 2017-01-21 NOTE — Progress Notes (Signed)
01-02-17 Surgical clearance from Dr. Keane Scrape on chart  01-02-17 [facility] on chart  08-01-16 [facility]) EKG

## 2017-01-21 NOTE — Patient Instructions (Signed)
[name]  01/21/2017   Your procedure is scheduled on: 01-26-17   Report to [facility] Main  Entrance Report to Admitting at 12:40 PM   Call this number if you have problems the morning of surgery  [phone]   Remember: ONLY 1 PERSON MAY GO WITH YOU TO SHORT STAY TO GET  READY MORNING OF YOUR SURGERY.  Do not eat food or drink liquids :After Midnight.     Take these medicines the morning of surgery with A SIP OF WATER: Amlodipine (Norvasc), Levothyroxine (Synthroid), and Metoprolol Succinate (Toprol-XL)                                You may not have any metal on your body including hair pins and              piercings  Do not wear jewelry, make-up, lotions, powders or perfumes, deodorant             Do not wear nail polish.  Do not shave  48 hours prior to surgery.               Do not bring valuables to the hospital. [place].  Contacts, dentures or bridgework may not be worn into surgery.  Leave suitcase in the car. After surgery it may be brought to your room.                Please read over the following fact sheets you were given: _____________________________________________________________________             [facility] - Preparing for Surgery Before surgery, you can play an important role.  Because skin is not sterile, your skin needs to be as free of germs as possible.  You can reduce the number of germs on your skin by washing with CHG (chlorahexidine gluconate) soap before surgery.  CHG is an antiseptic cleaner which kills germs and bonds with the skin to continue killing germs even after washing. Please DO NOT use if you have an allergy to CHG or antibacterial soaps.  If your skin becomes reddened/irritated stop using the CHG and inform your nurse when you arrive at Short Stay. Do not shave (including legs and underarms) for at least 48 hours prior to the first CHG shower.  You may  shave your face/neck. Please follow these instructions carefully:  1.  Shower with CHG Soap the night before surgery and the  morning of Surgery.  2.  If you choose to wash your hair, wash your hair first as usual with your  normal  shampoo.  3.  After you shampoo, rinse your hair and body thoroughly to remove the  shampoo.                           4.  Use CHG as you would any other liquid soap.  You can apply chg directly  to the skin and wash                       Gently with a scrungie or clean washcloth.  5.  Apply the CHG Soap to your body ONLY FROM THE NECK  DOWN.   Do not use on face/ open                           Wound or open sores. Avoid contact with eyes, ears mouth and genitals (private parts).                       Wash face,  Genitals (private parts) with your normal soap.             6.  Wash thoroughly, paying special attention to the area where your surgery  will be performed.  7.  Thoroughly rinse your body with warm water from the neck down.  8.  DO NOT shower/wash with your normal soap after using and rinsing off  the CHG Soap.                9.  Pat yourself dry with a clean towel.            10.  Wear clean pajamas.            11.  Place clean sheets on your bed the night of your first shower and do not  sleep with pets. Day of Surgery : Do not apply any lotions/deodorants the morning of surgery.  Please wear clean clothes to the hospital/surgery center.  FAILURE TO FOLLOW THESE INSTRUCTIONS MAY RESULT IN THE CANCELLATION OF YOUR SURGERY PATIENT SIGNATURE_________________________________  NURSE SIGNATURE__________________________________  ________________________________________________________________________   Adam Phenix  An incentive spirometer is a tool that can help keep your lungs clear and active. This tool measures how well you are filling your lungs with each breath. Taking long deep breaths may help reverse or decrease the chance of developing  breathing (pulmonary) problems (especially infection) following:  A long period of time when you are unable to move or be active. BEFORE THE PROCEDURE   If the spirometer includes an indicator to show your best effort, your nurse or respiratory therapist will set it to a desired goal.  If possible, sit up straight or lean slightly forward. Try not to slouch.  Hold the incentive spirometer in an upright position. INSTRUCTIONS FOR USE  1. Sit on the edge of your bed if possible, or sit up as far as you can in bed or on a chair. 2. Hold the incentive spirometer in an upright position. 3. Breathe out normally. 4. Place the mouthpiece in your mouth and seal your lips tightly around it. 5. Breathe in slowly and as deeply as possible, raising the piston or the ball toward the top of the column. 6. Hold your breath for 3-5 seconds or for as long as possible. Allow the piston or ball to fall to the bottom of the column. 7. Remove the mouthpiece from your mouth and breathe out normally. 8. Rest for a few seconds and repeat Steps 1 through 7 at least 10 times every 1-2 hours when you are awake. Take your time and take a few normal breaths between deep breaths. 9. The spirometer may include an indicator to show your best effort. Use the indicator as a goal to work toward during each repetition. 10. After each set of 10 deep breaths, practice coughing to be sure your lungs are clear. If you have an incision (the cut made at the time of surgery), support your incision when coughing by placing a pillow or rolled up towels firmly against it. Once you are able to  get out of bed, walk around indoors and cough well. You may stop using the incentive spirometer when instructed by your caregiver.  RISKS AND COMPLICATIONS  Take your time so you do not get dizzy or light-headed.  If you are in pain, you may need to take or ask for pain medication before doing incentive spirometry. It is harder to take a deep  breath if you are having pain. AFTER USE  Rest and breathe slowly and easily.  It can be helpful to keep track of a log of your progress. Your caregiver can provide you with a simple table to help with this. If you are using the spirometer at home, follow these instructions: [place] IF:   You are having difficultly using the spirometer.  You have trouble using the spirometer as often as instructed.  Your pain medication is not giving enough relief while using the spirometer.  You develop fever of 100.5 F (38.1 C) or higher. SEEK IMMEDIATE MEDICAL CARE IF:   You cough up bloody sputum that had not been present before.  You develop fever of 102 F (38.9 C) or greater.  You develop worsening pain at or near the incision site. MAKE SURE YOU:   Understand these instructions.  Will watch your condition.  Will get help right away if you are not doing well or get worse. Document Released: 07/07/2006 Document Revised: 05/19/2011 Document Reviewed: 09/07/2006 Exit[employer] Patient Information 2014 Exit[employer], [place].   ________________________________________________________________________  WHAT IS A BLOOD TRANSFUSION? Blood Transfusion Information  A transfusion is the replacement of blood or some of its parts. Blood is made up of multiple cells which provide different functions.  Red blood cells carry oxygen and are used for blood loss replacement.  White blood cells fight against infection.  Platelets control bleeding.  Plasma helps clot blood.  Other blood products are available for specialized needs, such as hemophilia or other clotting disorders. BEFORE THE TRANSFUSION  Who gives blood for transfusions?   Healthy volunteers who are fully evaluated to make sure their blood is safe. This is blood bank blood. Transfusion therapy is the safest it has ever been in the practice of medicine. Before blood is taken from a donor, a complete history is taken to make sure  that person has no history of diseases nor engages in risky social behavior (examples are intravenous drug use or sexual activity with multiple partners). The donor's travel history is screened to minimize risk of transmitting infections, such as malaria. The donated blood is tested for signs of infectious diseases, such as HIV and hepatitis. The blood is then tested to be sure it is compatible with you in order to minimize the chance of a transfusion reaction. If you or a relative donates blood, this is often done in anticipation of surgery and is not appropriate for emergency situations. It takes many days to process the donated blood. RISKS AND COMPLICATIONS Although transfusion therapy is very safe and saves many lives, the main dangers of transfusion include:   Getting an infectious disease.  Developing a transfusion reaction. This is an allergic reaction to something in the blood you were given. Every precaution is taken to prevent this. The decision to have a blood transfusion has been considered carefully by your caregiver before blood is given. Blood is not given unless the benefits outweigh the risks. AFTER THE TRANSFUSION  Right after receiving a blood transfusion, you will usually feel much better and more energetic. This is especially true if  your red blood cells have gotten low (anemic). The transfusion raises the level of the red blood cells which carry oxygen, and this usually causes an energy increase.  The nurse administering the transfusion will monitor you carefully for complications. HOME CARE INSTRUCTIONS  No special instructions are needed after a transfusion. You may find your energy is better. Speak with your caregiver about any limitations on activity for underlying diseases you may have. SEEK MEDICAL CARE IF:   Your condition is not improving after your transfusion.  You develop redness or irritation at the intravenous (IV) site. SEEK IMMEDIATE MEDICAL CARE IF:  Any of  the following symptoms occur over the next 12 hours:  Shaking chills.  You have a temperature by mouth above 102 F (38.9 C), not controlled by medicine.  Chest, back, or muscle pain.  People around you feel you are not acting correctly or are confused.  Shortness of breath or difficulty breathing.  Dizziness and fainting.  You get a rash or develop hives.  You have a decrease in urine output.  Your urine turns a dark color or changes to pink, red, or brown. Any of the following symptoms occur over the next 10 days:  You have a temperature by mouth above 102 F (38.9 C), not controlled by medicine.  Shortness of breath.  Weakness after normal activity.  The white part of the eye turns yellow (jaundice).  You have a decrease in the amount of urine or are urinating less often.  Your urine turns a dark color or changes to pink, red, or brown. Document Released: 02/22/2000 Document Revised: 05/19/2011 Document Reviewed: 10/11/2007 [facility] Patient Information 2014 [place], [place].  _______________________________________________________________________

## 2017-01-22 ENCOUNTER — Encounter (HOSPITAL_COMMUNITY)
Admission: RE | Admit: 2017-01-22 | Discharge: 2017-01-22 | Disposition: A | Discharge status: Home / Self Care | Payer: Medicare Other | Source: Ambulatory Visit | Attending: Orthopedic Surgery | Admitting: Orthopedic Surgery

## 2017-01-22 ENCOUNTER — Encounter (HOSPITAL_COMMUNITY): Payer: Self-pay

## 2017-01-22 ENCOUNTER — Other Ambulatory Visit: Payer: Self-pay

## 2017-01-22 DIAGNOSIS — Z0183 Encounter for blood typing: Secondary | ICD-10-CM | POA: Insufficient documentation

## 2017-01-22 DIAGNOSIS — Z01812 Encounter for preprocedural laboratory examination: Secondary | ICD-10-CM | POA: Diagnosis not present

## 2017-01-22 DIAGNOSIS — M1711 Unilateral primary osteoarthritis, right knee: Secondary | ICD-10-CM | POA: Diagnosis not present

## 2017-01-22 LAB — COMPREHENSIVE METABOLIC PANEL
ALBUMIN: 3.8 g/dL (ref 3.5–5.0)
ALK PHOS: 71 U/L (ref 38–126)
ALT: 10 U/L — AB (ref 14–54)
ANION GAP: 5 (ref 5–15)
AST: 19 U/L (ref 15–41)
BUN: 16 mg/dL (ref 6–20)
CALCIUM: 9.2 mg/dL (ref 8.9–10.3)
CHLORIDE: 106 mmol/L (ref 101–111)
CO2: 28 mmol/L (ref 22–32)
CREATININE: 1.12 mg/dL — AB (ref 0.44–1.00)
GFR calc Af Amer: 54 mL/min — ABNORMAL LOW (ref >60)
GFR calc non Af Amer: 46 mL/min — ABNORMAL LOW (ref >60)
Glucose, Bld: 102 mg/dL — ABNORMAL HIGH (ref 65–99)
Potassium: 4.1 mmol/L (ref 3.5–5.1)
SODIUM: 139 mmol/L (ref 135–145)
Total Bilirubin: 0.7 mg/dL (ref 0.3–1.2)
Total Protein: 7.8 g/dL (ref 6.5–8.1)

## 2017-01-22 LAB — APTT: APTT: 31 s (ref 24–36)

## 2017-01-22 LAB — CBC
HCT: 38.1 % (ref 36.0–46.0)
HEMOGLOBIN: 13 g/dL (ref 12.0–15.0)
MCH: 32.4 pg (ref 26.0–34.0)
MCHC: 34.1 g/dL (ref 30.0–36.0)
MCV: 95 fL (ref 78.0–100.0)
Platelets: 235 10*3/uL (ref 150–400)
RBC: 4.01 MIL/uL (ref 3.87–5.11)
RDW: 13.4 % (ref 11.5–15.5)
WBC: 3.5 10*3/uL — ABNORMAL LOW (ref 4.0–10.5)

## 2017-01-22 LAB — HEMOGLOBIN A1C
Hgb A1c MFr Bld: 5.9 % — ABNORMAL HIGH (ref 4.8–5.6)
Mean Plasma Glucose: 122.63 mg/dL

## 2017-01-22 LAB — PROTIME-INR
INR: 0.97
Prothrombin Time: 12.8 seconds (ref 11.4–15.2)

## 2017-01-22 LAB — SURGICAL PCR SCREEN
MRSA, PCR: NEGATIVE
Staphylococcus aureus: NEGATIVE

## 2017-01-24 ENCOUNTER — Ambulatory Visit: Payer: Self-pay | Admitting: Orthopedic Surgery

## 2017-01-24 NOTE — H&P (Signed)
[name] DOB: December 15, 1940 Married / Language: English / Race: Black or African American Female Date of admission: January 26, 2017  Chief complaint: Right knee pain History of Present Illness The patient is a 76 year old female who comes in for a preoperative History and Physical. The patient is scheduled for a right total knee arthroplasty to be performed by Dr. Dione Plover. Aluisio, MD at [facility] on 01-26-2017. The patient is a 76 year old female who presents today for follow up of their knee. The patient is being followed for their right knee pain and osteoarthritis. They are now 3 1/2 months out from cortisone injection (by Dr. Veverly Fells). Symptoms reported today include: pain, swelling, aching, stiffness, giving way, instability and difficulty ambulating. The patient feels that they are doing poorly and report their pain level to be moderate. The following medication has been used for pain control: none. The patient has reported improvement of their symptoms with: Cortisone injections. The patient indicates that they have questions or concerns today regarding their progress at this point. She has done well with the injections but is now at a point where she wants to get the knee replaced. They have been treated conservatively in the past for the above stated problem and despite conservative measures, they continue to have progressive pain and severe functional limitations and dysfunction. They have failed non-operative management including home exercise, medications, and injections. It is felt that they would benefit from undergoing total joint replacement. Risks and benefits of the procedure have been discussed with the patient and they elect to proceed with surgery. There are no active contraindications to surgery such as ongoing infection or rapidly progressive neurological disease.   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 Cerebrovascular Accident  Mother. Cancer  Brother, Father.  Social History Current work status  retired Not under pain contract  Tobacco use  Never smoker. 09/16/2013 Tobacco / smoke exposure  09/16/2013: yes Number of flights of stairs before winded  less than 1 No history of drug/alcohol rehab  Children  4 Never consumed alcohol  09/16/2013: Never consumed alcohol Exercise  Exercises never  Medication History  Furosemide (40MG  Tablet, Oral) Active. (qd) AmLODIPine Besylate (10MG  Tablet, Oral) Active. (qd) Levothyroxine Sodium (75MCG Capsule, Oral) Active. (qd) Simvastatin (10MG  Tablet, Oral) Active. (qd) Potassium Chloride ER (10MEQ Capsule ER, Oral) Active. (qd) Metoprolol Tartrate (Oral) Specific strength unknown - Active.   Past Surgical History  Total Knee Replacement - Left [07/16/2015]:    Review of Systems General Not Present- Chills, Fatigue, Fever, Memory Loss, Night Sweats, Weight Gain and Weight Loss. Skin Not Present- Eczema, Hives, Itching, Lesions and Rash. HEENT Not Present- Dentures, Double Vision, Headache, Hearing Loss, Tinnitus and Visual Loss. Respiratory Not Present- Allergies, Chronic Cough, Coughing up blood, Shortness of breath at rest and Shortness of breath with exertion. Cardiovascular Not Present- Chest Pain, Difficulty Breathing Lying Down, Murmur, Palpitations, Racing/skipping heartbeats and Swelling. Gastrointestinal Not Present- Abdominal Pain, Bloody Stool, Constipation, Diarrhea, Difficulty Swallowing, Heartburn, Jaundice, Loss of appetitie, Nausea and  Vomiting. Female Genitourinary Not Present- Blood in Urine, Discharge, Flank Pain, Incontinence, Painful Urination, Urgency,  Urinary frequency, Urinary Retention, Urinating at Night and Weak urinary stream. Musculoskeletal Present- Joint Pain. Not Present- Back Pain, Joint Swelling, Morning Stiffness, Muscle Pain, Muscle Weakness and Spasms. Neurological Not Present- Blackout spells, Difficulty with balance, Dizziness, Paralysis, Tremor and Weakness. Psychiatric Not Present- Insomnia.  Vitals  Weight: 235 lb Height: 63.5in Weight was reported by patient. Height was reported by patient. Body Surface Area: 2.08 m Body Mass Index: 40.97 kg/m  Pulse: 76 (Regular)  BP: 146/86 (Sitting, Right Arm, Standard)    Physical Exam  General Mental Status -Alert, cooperative and good historian. General Appearance-pleasant, Not in acute distress. Orientation-Oriented X3. Build & Nutrition-Well nourished and Well developed.  Head and Neck Head-normocephalic, atraumatic . Neck Global Assessment - supple, no bruit auscultated on the right, no bruit auscultated on the left.  Eye Vision-Wears corrective lenses. Pupil - Bilateral-Regular and Round. Motion - Bilateral-EOMI.  ENMT Note: upper and lower dentures   Chest and Lung Exam Auscultation Breath sounds - clear at anterior chest wall and clear at posterior chest wall. Adventitious sounds - No Adventitious sounds.  Cardiovascular Auscultation Rhythm - Regular rate and rhythm. Heart Sounds - S1 WNL and S2 WNL. Murmurs & Other Heart Sounds - Auscultation of the heart reveals - No Murmurs.  Abdomen Palpation/Percussion Tenderness - Abdomen is non-tender to palpation. Rigidity (guarding) - Abdomen is soft. Auscultation Auscultation of the abdomen reveals - Bowel sounds normal.  Female Genitourinary Note: Not done, not pertinent to present illness   Musculoskeletal Note: RIGHT knee shows no effusion.  Range of motion is 10-120. There is marked crepitus on range of motion. She is tender medial and lateral with no instability.     Assessment & Plan  Primary osteoarthritis of right knee (M17.11) Status post total left knee replacement (L27.517)  Note:Surgical Plans: Right Total Knee Replacement  Disposition: Home with family, Outpatient Therapy  PCP: Dr. Renold Genta - Patient has been seen preoperatively and felt to be stable for surgery.  IV TXA  Anesthesia Issues: None  Patient was instructed on what medications to stop prior to surgery.  Signed electronically by Joelene Millin, III PA-C

## 2017-01-24 NOTE — H&P (View-Only) (Signed)
[name] DOB: 07-24-1940 Married / Language: English / Race: Black or African American Female Date of admission: January 26, 2017  Chief complaint: Right knee pain History of Present Illness The patient is a 76 year old female who comes in for a preoperative History and Physical. The patient is scheduled for a right total knee arthroplasty to be performed by Dr. Dione Plover. Aluisio, MD at [facility] on 01-26-2017. The patient is a 75 year old female who presents today for follow up of their knee. The patient is being followed for their right knee pain and osteoarthritis. They are now 3 1/2 months out from cortisone injection (by Dr. Veverly Fells). Symptoms reported today include: pain, swelling, aching, stiffness, giving way, instability and difficulty ambulating. The patient feels that they are doing poorly and report their pain level to be moderate. The following medication has been used for pain control: none. The patient has reported improvement of their symptoms with: Cortisone injections. The patient indicates that they have questions or concerns today regarding their progress at this point. She has done well with the injections but is now at a point where she wants to get the knee replaced. They have been treated conservatively in the past for the above stated problem and despite conservative measures, they continue to have progressive pain and severe functional limitations and dysfunction. They have failed non-operative management including home exercise, medications, and injections. It is felt that they would benefit from undergoing total joint replacement. Risks and benefits of the procedure have been discussed with the patient and they elect to proceed with surgery. There are no active contraindications to surgery such as ongoing infection or rapidly progressive neurological disease.   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 Cerebrovascular Accident  Mother. Cancer  Brother, Father.  Social History Current work status  retired Not under pain contract  Tobacco use  Never smoker. 09/16/2013 Tobacco / smoke exposure  09/16/2013: yes Number of flights of stairs before winded  less than 1 No history of drug/alcohol rehab  Children  4 Never consumed alcohol  09/16/2013: Never consumed alcohol Exercise  Exercises never  Medication History  Furosemide (40MG  Tablet, Oral) Active. (qd) AmLODIPine Besylate (10MG  Tablet, Oral) Active. (qd) Levothyroxine Sodium (75MCG Capsule, Oral) Active. (qd) Simvastatin (10MG  Tablet, Oral) Active. (qd) Potassium Chloride ER (10MEQ Capsule ER, Oral) Active. (qd) Metoprolol Tartrate (Oral) Specific strength unknown - Active.   Past Surgical History  Total Knee Replacement - Left [07/16/2015]:    Review of Systems General Not Present- Chills, Fatigue, Fever, Memory Loss, Night Sweats, Weight Gain and Weight Loss. Skin Not Present- Eczema, Hives, Itching, Lesions and Rash. HEENT Not Present- Dentures, Double Vision, Headache, Hearing Loss, Tinnitus and Visual Loss. Respiratory Not Present- Allergies, Chronic Cough, Coughing up blood, Shortness of breath at rest and Shortness of breath with exertion. Cardiovascular Not Present- Chest Pain, Difficulty Breathing Lying Down, Murmur, Palpitations, Racing/skipping heartbeats and Swelling. Gastrointestinal Not Present- Abdominal Pain, Bloody Stool, Constipation, Diarrhea, Difficulty Swallowing, Heartburn, Jaundice, Loss of appetitie, Nausea and  Vomiting. Female Genitourinary Not Present- Blood in Urine, Discharge, Flank Pain, Incontinence, Painful Urination, Urgency,  Urinary frequency, Urinary Retention, Urinating at Night and Weak urinary stream. Musculoskeletal Present- Joint Pain. Not Present- Back Pain, Joint Swelling, Morning Stiffness, Muscle Pain, Muscle Weakness and Spasms. Neurological Not Present- Blackout spells, Difficulty with balance, Dizziness, Paralysis, Tremor and Weakness. Psychiatric Not Present- Insomnia.  Vitals  Weight: 235 lb Height: 63.5in Weight was reported by patient. Height was reported by patient. Body Surface Area: 2.08 m Body Mass Index: 40.97 kg/m  Pulse: 76 (Regular)  BP: 146/86 (Sitting, Right Arm, Standard)    Physical Exam  General Mental Status -Alert, cooperative and good historian. General Appearance-pleasant, Not in acute distress. Orientation-Oriented X3. Build & Nutrition-Well nourished and Well developed.  Head and Neck Head-normocephalic, atraumatic . Neck Global Assessment - supple, no bruit auscultated on the right, no bruit auscultated on the left.  Eye Vision-Wears corrective lenses. Pupil - Bilateral-Regular and Round. Motion - Bilateral-EOMI.  ENMT Note: upper and lower dentures   Chest and Lung Exam Auscultation Breath sounds - clear at anterior chest wall and clear at posterior chest wall. Adventitious sounds - No Adventitious sounds.  Cardiovascular Auscultation Rhythm - Regular rate and rhythm. Heart Sounds - S1 WNL and S2 WNL. Murmurs & Other Heart Sounds - Auscultation of the heart reveals - No Murmurs.  Abdomen Palpation/Percussion Tenderness - Abdomen is non-tender to palpation. Rigidity (guarding) - Abdomen is soft. Auscultation Auscultation of the abdomen reveals - Bowel sounds normal.  Female Genitourinary Note: Not done, not pertinent to present illness   Musculoskeletal Note: RIGHT knee shows no effusion.  Range of motion is 10-120. There is marked crepitus on range of motion. She is tender medial and lateral with no instability.     Assessment & Plan  Primary osteoarthritis of right knee (M17.11) Status post total left knee replacement (K59.935)  Note:Surgical Plans: Right Total Knee Replacement  Disposition: Home with family, Outpatient Therapy  PCP: Dr. Renold Genta - Patient has been seen preoperatively and felt to be stable for surgery.  IV TXA  Anesthesia Issues: None  Patient was instructed on what medications to stop prior to surgery.  Signed electronically by Joelene Millin, III PA-C

## 2017-01-26 ENCOUNTER — Inpatient Hospital Stay (HOSPITAL_COMMUNITY): Payer: Medicare Other | Admitting: Anesthesiology

## 2017-01-26 ENCOUNTER — Other Ambulatory Visit: Payer: Self-pay

## 2017-01-26 ENCOUNTER — Encounter (HOSPITAL_COMMUNITY): Payer: Self-pay | Admitting: *Deleted

## 2017-01-26 ENCOUNTER — Encounter (HOSPITAL_COMMUNITY)
Admission: RE | Disposition: A | Discharge status: Home / Self Care | Payer: Self-pay | Source: Ambulatory Visit | Attending: Orthopedic Surgery

## 2017-01-26 ENCOUNTER — Inpatient Hospital Stay (HOSPITAL_COMMUNITY)
Admission: RE | Admit: 2017-01-26 | Discharge: 2017-01-28 | DRG: 470 | Disposition: A | Discharge status: Home / Self Care | Payer: Medicare Other | Source: Ambulatory Visit | Attending: Orthopedic Surgery | Admitting: Orthopedic Surgery

## 2017-01-26 DIAGNOSIS — E039 Hypothyroidism, unspecified: Secondary | ICD-10-CM | POA: Diagnosis present

## 2017-01-26 DIAGNOSIS — M179 Osteoarthritis of knee, unspecified: Secondary | ICD-10-CM | POA: Diagnosis present

## 2017-01-26 DIAGNOSIS — Z96652 Presence of left artificial knee joint: Secondary | ICD-10-CM | POA: Diagnosis present

## 2017-01-26 DIAGNOSIS — I10 Essential (primary) hypertension: Secondary | ICD-10-CM | POA: Diagnosis present

## 2017-01-26 DIAGNOSIS — E785 Hyperlipidemia, unspecified: Secondary | ICD-10-CM | POA: Diagnosis present

## 2017-01-26 DIAGNOSIS — R7303 Prediabetes: Secondary | ICD-10-CM | POA: Diagnosis present

## 2017-01-26 DIAGNOSIS — F419 Anxiety disorder, unspecified: Secondary | ICD-10-CM | POA: Diagnosis present

## 2017-01-26 DIAGNOSIS — Z6841 Body Mass Index (BMI) 40.0 and over, adult: Secondary | ICD-10-CM | POA: Diagnosis not present

## 2017-01-26 DIAGNOSIS — M1710 Unilateral primary osteoarthritis, unspecified knee: Secondary | ICD-10-CM | POA: Diagnosis present

## 2017-01-26 DIAGNOSIS — M1711 Unilateral primary osteoarthritis, right knee: Secondary | ICD-10-CM | POA: Diagnosis not present

## 2017-01-26 DIAGNOSIS — G8929 Other chronic pain: Secondary | ICD-10-CM | POA: Diagnosis present

## 2017-01-26 DIAGNOSIS — G8918 Other acute postprocedural pain: Secondary | ICD-10-CM | POA: Diagnosis not present

## 2017-01-26 DIAGNOSIS — Z79890 Hormone replacement therapy (postmenopausal): Secondary | ICD-10-CM | POA: Diagnosis not present

## 2017-01-26 DIAGNOSIS — M25561 Pain in right knee: Secondary | ICD-10-CM | POA: Diagnosis not present

## 2017-01-26 DIAGNOSIS — E7800 Pure hypercholesterolemia, unspecified: Secondary | ICD-10-CM | POA: Diagnosis present

## 2017-01-26 HISTORY — PX: TOTAL KNEE ARTHROPLASTY: SHX125

## 2017-01-26 LAB — TYPE AND SCREEN
ABO/RH(D): O POS
Antibody Screen: NEGATIVE

## 2017-01-26 SURGERY — ARTHROPLASTY, KNEE, TOTAL
Anesthesia: Spinal | Site: Knee | Laterality: Right

## 2017-01-26 MED ORDER — FENTANYL CITRATE (PF) 100 MCG/2ML IJ SOLN
50.0000 ug | INTRAMUSCULAR | Status: DC | PRN
  Administered 2017-01-26: 50 ug via INTRAVENOUS

## 2017-01-26 MED ORDER — AMLODIPINE BESYLATE 5 MG PO TABS
5.0000 mg | ORAL_TABLET | Freq: Every day | ORAL | Status: DC
  Administered 2017-01-27 – 2017-01-28 (×2): 5 mg via ORAL
  Filled 2017-01-26 (×2): qty 1

## 2017-01-26 MED ORDER — POTASSIUM CHLORIDE ER 10 MEQ PO TBCR
10.0000 meq | EXTENDED_RELEASE_TABLET | Freq: Every day | ORAL | Status: DC
  Administered 2017-01-27 – 2017-01-28 (×2): 10 meq via ORAL
  Filled 2017-01-26 (×4): qty 1

## 2017-01-26 MED ORDER — ACETAMINOPHEN 650 MG RE SUPP: 650.0000 mg | RECTAL | Status: DC | PRN

## 2017-01-26 MED ORDER — ACETAMINOPHEN 10 MG/ML IV SOLN
1000.0000 mg | Freq: Once | INTRAVENOUS | Status: AC
  Administered 2017-01-26: 1000 mg via INTRAVENOUS

## 2017-01-26 MED ORDER — TRAMADOL HCL 50 MG PO TABS
50.0000 mg | ORAL_TABLET | Freq: Four times a day (QID) | ORAL | Status: DC | PRN
  Administered 2017-01-26 – 2017-01-27 (×2): 100 mg via ORAL
  Filled 2017-01-26 (×2): qty 2

## 2017-01-26 MED ORDER — POLYETHYLENE GLYCOL 3350 17 G PO PACK: 17.0000 g | PACK | Freq: Every day | ORAL | Status: DC | PRN

## 2017-01-26 MED ORDER — FENTANYL CITRATE (PF) 100 MCG/2ML IJ SOLN
INTRAMUSCULAR | Status: AC
  Filled 2017-01-26: qty 2

## 2017-01-26 MED ORDER — TRANEXAMIC ACID 1000 MG/10ML IV SOLN
1000.0000 mg | INTRAVENOUS | Status: AC
  Administered 2017-01-26: 1000 mg via INTRAVENOUS
  Filled 2017-01-26: qty 1100

## 2017-01-26 MED ORDER — DIPHENHYDRAMINE HCL 12.5 MG/5ML PO ELIX
12.5000 mg | ORAL_SOLUTION | ORAL | Status: DC | PRN
  Administered 2017-01-27: 25 mg via ORAL
  Filled 2017-01-26: qty 10

## 2017-01-26 MED ORDER — SODIUM CHLORIDE 0.9 % IR SOLN
Status: DC | PRN
  Administered 2017-01-26: 1000 mL

## 2017-01-26 MED ORDER — FUROSEMIDE 40 MG PO TABS
40.0000 mg | ORAL_TABLET | Freq: Every day | ORAL | Status: DC
  Filled 2017-01-26 (×2): qty 1

## 2017-01-26 MED ORDER — BUPIVACAINE LIPOSOME 1.3 % IJ SUSP
20.0000 mL | Freq: Once | INTRAMUSCULAR | Status: DC
  Filled 2017-01-26: qty 20

## 2017-01-26 MED ORDER — METOCLOPRAMIDE HCL 5 MG/ML IJ SOLN: 5.0000 mg | Freq: Three times a day (TID) | INTRAMUSCULAR | Status: DC | PRN

## 2017-01-26 MED ORDER — DEXAMETHASONE SODIUM PHOSPHATE 10 MG/ML IJ SOLN
10.0000 mg | Freq: Once | INTRAMUSCULAR | Status: AC
  Administered 2017-01-27: 10 mg via INTRAVENOUS
  Filled 2017-01-26: qty 1

## 2017-01-26 MED ORDER — FLEET ENEMA 7-19 GM/118ML RE ENEM: 1.0000 | ENEMA | Freq: Once | RECTAL | Status: DC | PRN

## 2017-01-26 MED ORDER — METOPROLOL SUCCINATE ER 25 MG PO TB24
25.0000 mg | ORAL_TABLET | Freq: Every day | ORAL | Status: DC
  Administered 2017-01-27 – 2017-01-28 (×2): 25 mg via ORAL
  Filled 2017-01-26 (×2): qty 1

## 2017-01-26 MED ORDER — LACTATED RINGERS IV SOLN
INTRAVENOUS | Status: DC
  Administered 2017-01-26 (×2): via INTRAVENOUS

## 2017-01-26 MED ORDER — BUPIVACAINE LIPOSOME 1.3 % IJ SUSP
INTRAMUSCULAR | Status: DC | PRN
  Administered 2017-01-26: 20 mL

## 2017-01-26 MED ORDER — ACETAMINOPHEN 500 MG PO TABS
1000.0000 mg | ORAL_TABLET | Freq: Four times a day (QID) | ORAL | Status: AC
  Administered 2017-01-26 – 2017-01-27 (×4): 1000 mg via ORAL
  Filled 2017-01-26 (×4): qty 2

## 2017-01-26 MED ORDER — MORPHINE SULFATE (PF) 4 MG/ML IV SOLN: 1.0000 mg | INTRAVENOUS | Status: DC | PRN

## 2017-01-26 MED ORDER — SODIUM CHLORIDE 0.9 % IJ SOLN
INTRAMUSCULAR | Status: AC
  Filled 2017-01-26: qty 10

## 2017-01-26 MED ORDER — RIVAROXABAN 10 MG PO TABS
10.0000 mg | ORAL_TABLET | Freq: Every day | ORAL | Status: DC
  Administered 2017-01-27 – 2017-01-28 (×2): 10 mg via ORAL
  Filled 2017-01-26 (×2): qty 1

## 2017-01-26 MED ORDER — ROPIVACAINE HCL 7.5 MG/ML IJ SOLN
INTRAMUSCULAR | Status: DC | PRN
  Administered 2017-01-26: 20 mL via PERINEURAL

## 2017-01-26 MED ORDER — SODIUM CHLORIDE 0.9 % IJ SOLN
INTRAMUSCULAR | Status: AC
  Filled 2017-01-26: qty 50

## 2017-01-26 MED ORDER — ONDANSETRON HCL 4 MG/2ML IJ SOLN: 4.0000 mg | Freq: Four times a day (QID) | INTRAMUSCULAR | Status: DC | PRN

## 2017-01-26 MED ORDER — PHENOL 1.4 % MT LIQD
1.0000 | OROMUCOSAL | Status: DC | PRN
  Filled 2017-01-26: qty 177

## 2017-01-26 MED ORDER — DEXAMETHASONE SODIUM PHOSPHATE 10 MG/ML IJ SOLN
10.0000 mg | Freq: Once | INTRAMUSCULAR | Status: AC
  Administered 2017-01-26: 10 mg via INTRAVENOUS

## 2017-01-26 MED ORDER — DOCUSATE SODIUM 100 MG PO CAPS
100.0000 mg | ORAL_CAPSULE | Freq: Two times a day (BID) | ORAL | Status: DC
  Administered 2017-01-26 – 2017-01-28 (×4): 100 mg via ORAL
  Filled 2017-01-26 (×4): qty 1

## 2017-01-26 MED ORDER — EPHEDRINE 5 MG/ML INJ
INTRAVENOUS | Status: AC
  Filled 2017-01-26: qty 10

## 2017-01-26 MED ORDER — ACETAMINOPHEN 10 MG/ML IV SOLN
INTRAVENOUS | Status: AC
  Filled 2017-01-26: qty 100

## 2017-01-26 MED ORDER — SODIUM CHLORIDE 0.9 % IV SOLN
INTRAVENOUS | Status: DC
  Administered 2017-01-26: 16:00:00 via INTRAVENOUS

## 2017-01-26 MED ORDER — SODIUM CHLORIDE 0.9 % IJ SOLN
INTRAMUSCULAR | Status: DC | PRN
  Administered 2017-01-26: 60 mL

## 2017-01-26 MED ORDER — SIMVASTATIN 20 MG PO TABS
10.0000 mg | ORAL_TABLET | Freq: Every evening | ORAL | Status: DC
  Administered 2017-01-26 – 2017-01-27 (×2): 10 mg via ORAL
  Filled 2017-01-26 (×2): qty 1

## 2017-01-26 MED ORDER — METHOCARBAMOL 500 MG PO TABS
500.0000 mg | ORAL_TABLET | Freq: Four times a day (QID) | ORAL | Status: DC | PRN
  Administered 2017-01-26 – 2017-01-27 (×2): 500 mg via ORAL
  Filled 2017-01-26 (×2): qty 1

## 2017-01-26 MED ORDER — ONDANSETRON HCL 4 MG/2ML IJ SOLN
INTRAMUSCULAR | Status: AC
  Filled 2017-01-26: qty 2

## 2017-01-26 MED ORDER — FENTANYL CITRATE (PF) 100 MCG/2ML IJ SOLN
INTRAMUSCULAR | Status: AC
  Administered 2017-01-26: 50 ug via INTRAVENOUS
  Filled 2017-01-26: qty 2

## 2017-01-26 MED ORDER — OXYCODONE HCL 5 MG PO TABS
10.0000 mg | ORAL_TABLET | ORAL | Status: DC | PRN
  Administered 2017-01-26 – 2017-01-28 (×6): 10 mg via ORAL
  Filled 2017-01-26 (×7): qty 2

## 2017-01-26 MED ORDER — ONDANSETRON HCL 4 MG/2ML IJ SOLN
INTRAMUSCULAR | Status: DC | PRN
  Administered 2017-01-26: 4 mg via INTRAVENOUS

## 2017-01-26 MED ORDER — OXYCODONE HCL 5 MG PO TABS
5.0000 mg | ORAL_TABLET | ORAL | Status: DC | PRN
  Administered 2017-01-26 (×2): 5 mg via ORAL
  Filled 2017-01-26 (×2): qty 1

## 2017-01-26 MED ORDER — LEVOTHYROXINE SODIUM 75 MCG PO TABS
75.0000 ug | ORAL_TABLET | Freq: Every day | ORAL | Status: DC
  Administered 2017-01-27 – 2017-01-28 (×2): 75 ug via ORAL
  Filled 2017-01-26 (×2): qty 1

## 2017-01-26 MED ORDER — FENTANYL CITRATE (PF) 100 MCG/2ML IJ SOLN: 25.0000 ug | INTRAMUSCULAR | Status: DC | PRN

## 2017-01-26 MED ORDER — TRANEXAMIC ACID 1000 MG/10ML IV SOLN
1000.0000 mg | Freq: Once | INTRAVENOUS | Status: AC
  Administered 2017-01-26: 1000 mg via INTRAVENOUS
  Filled 2017-01-26: qty 1100

## 2017-01-26 MED ORDER — CEFAZOLIN SODIUM-DEXTROSE 2-4 GM/100ML-% IV SOLN
2.0000 g | INTRAVENOUS | Status: AC
  Administered 2017-01-26: 2 g via INTRAVENOUS

## 2017-01-26 MED ORDER — MIDAZOLAM HCL 2 MG/2ML IJ SOLN
INTRAMUSCULAR | Status: AC
  Administered 2017-01-26: 2 mg via INTRAVENOUS
  Filled 2017-01-26: qty 2

## 2017-01-26 MED ORDER — METHOCARBAMOL 1000 MG/10ML IJ SOLN
500.0000 mg | Freq: Four times a day (QID) | INTRAVENOUS | Status: DC | PRN
  Filled 2017-01-26: qty 5

## 2017-01-26 MED ORDER — ACETAMINOPHEN 325 MG PO TABS: 650.0000 mg | ORAL_TABLET | ORAL | Status: DC | PRN

## 2017-01-26 MED ORDER — CHLORHEXIDINE GLUCONATE 4 % EX LIQD: 60.0000 mL | Freq: Once | CUTANEOUS | Status: DC

## 2017-01-26 MED ORDER — PROPOFOL 500 MG/50ML IV EMUL
INTRAVENOUS | Status: DC | PRN
  Administered 2017-01-26: 50 ug/kg/min via INTRAVENOUS

## 2017-01-26 MED ORDER — EPHEDRINE SULFATE-NACL 50-0.9 MG/10ML-% IV SOSY
PREFILLED_SYRINGE | INTRAVENOUS | Status: DC | PRN
  Administered 2017-01-26 (×2): 15 mg via INTRAVENOUS

## 2017-01-26 MED ORDER — CEFAZOLIN SODIUM-DEXTROSE 2-4 GM/100ML-% IV SOLN
2.0000 g | Freq: Four times a day (QID) | INTRAVENOUS | Status: AC
  Administered 2017-01-26 – 2017-01-27 (×2): 2 g via INTRAVENOUS
  Filled 2017-01-26 (×2): qty 100

## 2017-01-26 MED ORDER — ONDANSETRON HCL 4 MG PO TABS: 4.0000 mg | ORAL_TABLET | Freq: Four times a day (QID) | ORAL | Status: DC | PRN

## 2017-01-26 MED ORDER — 0.9 % SODIUM CHLORIDE (POUR BTL) OPTIME
TOPICAL | Status: DC | PRN
  Administered 2017-01-26: 1000 mL

## 2017-01-26 MED ORDER — MIDAZOLAM HCL 2 MG/2ML IJ SOLN
1.0000 mg | INTRAMUSCULAR | Status: DC | PRN
  Administered 2017-01-26: 2 mg via INTRAVENOUS

## 2017-01-26 MED ORDER — MIDAZOLAM HCL 2 MG/2ML IJ SOLN
INTRAMUSCULAR | Status: AC
  Filled 2017-01-26: qty 2

## 2017-01-26 MED ORDER — MENTHOL 3 MG MT LOZG
1.0000 | LOZENGE | OROMUCOSAL | Status: DC | PRN
  Administered 2017-01-27: 23:00:00 3 mg via ORAL
  Filled 2017-01-26: qty 9

## 2017-01-26 MED ORDER — DEXAMETHASONE SODIUM PHOSPHATE 10 MG/ML IJ SOLN
INTRAMUSCULAR | Status: AC
  Filled 2017-01-26: qty 1

## 2017-01-26 MED ORDER — CEFAZOLIN SODIUM-DEXTROSE 2-4 GM/100ML-% IV SOLN
INTRAVENOUS | Status: AC
  Filled 2017-01-26: qty 100

## 2017-01-26 MED ORDER — BISACODYL 10 MG RE SUPP: 10.0000 mg | Freq: Every day | RECTAL | Status: DC | PRN

## 2017-01-26 MED ORDER — METOCLOPRAMIDE HCL 5 MG PO TABS: 5.0000 mg | ORAL_TABLET | Freq: Three times a day (TID) | ORAL | Status: DC | PRN

## 2017-01-26 SURGICAL SUPPLY — 50 items
BAG DECANTER FOR FLEXI CONT (MISCELLANEOUS) IMPLANT
BAG ZIPLOCK 12X15 (MISCELLANEOUS) ×3 IMPLANT
BANDAGE ACE 6X5 VEL STRL LF (GAUZE/BANDAGES/DRESSINGS) ×3 IMPLANT
BLADE SAG 18X100X1.27 (BLADE) ×3 IMPLANT
BLADE SAW SGTL 11.0X1.19X90.0M (BLADE) ×3 IMPLANT
BOWL SMART MIX CTS (DISPOSABLE) ×3 IMPLANT
CAP KNEE TOTAL 3 SIGMA ×3 IMPLANT
CEMENT HV SMART SET (Cement) ×6 IMPLANT
CLOSURE WOUND 1/2 X4 (GAUZE/BANDAGES/DRESSINGS) ×2
COVER SURGICAL LIGHT HANDLE (MISCELLANEOUS) ×3 IMPLANT
CUFF TOURN SGL QUICK 34 (TOURNIQUET CUFF) ×2
CUFF TRNQT CYL 34X4X40X1 (TOURNIQUET CUFF) ×1 IMPLANT
DECANTER SPIKE VIAL GLASS SM (MISCELLANEOUS) ×3 IMPLANT
DRAPE U-SHAPE 47X51 STRL (DRAPES) ×3 IMPLANT
DRSG ADAPTIC 3X8 NADH LF (GAUZE/BANDAGES/DRESSINGS) ×3 IMPLANT
DRSG PAD ABDOMINAL 8X10 ST (GAUZE/BANDAGES/DRESSINGS) ×3 IMPLANT
DURAPREP 26ML APPLICATOR (WOUND CARE) ×3 IMPLANT
ELECT REM PT RETURN 15FT ADLT (MISCELLANEOUS) ×3 IMPLANT
EVACUATOR 1/8 PVC DRAIN (DRAIN) ×3 IMPLANT
GAUZE SPONGE 4X4 12PLY STRL (GAUZE/BANDAGES/DRESSINGS) ×3 IMPLANT
GLOVE BIO SURGEON STRL SZ7.5 (GLOVE) ×6 IMPLANT
GLOVE BIO SURGEON STRL SZ8 (GLOVE) ×3 IMPLANT
GLOVE BIOGEL PI IND STRL 6.5 (GLOVE) IMPLANT
GLOVE BIOGEL PI IND STRL 8 (GLOVE) ×1 IMPLANT
GLOVE BIOGEL PI INDICATOR 6.5 (GLOVE)
GLOVE BIOGEL PI INDICATOR 8 (GLOVE) ×2
GLOVE SURG SS PI 6.5 STRL IVOR (GLOVE) IMPLANT
GOWN STRL REUS W/TWL LRG LVL3 (GOWN DISPOSABLE) ×3 IMPLANT
GOWN STRL REUS W/TWL XL LVL3 (GOWN DISPOSABLE) IMPLANT
HANDPIECE INTERPULSE COAX TIP (DISPOSABLE) ×2
IMMOBILIZER KNEE 20 (SOFTGOODS) ×3 IMPLANT
IMMOBILIZER KNEE 20 THIGH 36 (SOFTGOODS) IMPLANT
MANIFOLD NEPTUNE II (INSTRUMENTS) ×3 IMPLANT
NS IRRIG 1000ML POUR BTL (IV SOLUTION) ×3 IMPLANT
PACK TOTAL KNEE CUSTOM (KITS) ×3 IMPLANT
PADDING CAST COTTON 6X4 STRL (CAST SUPPLIES) ×6 IMPLANT
POSITIONER SURGICAL ARM (MISCELLANEOUS) ×3 IMPLANT
SET HNDPC FAN SPRY TIP SCT (DISPOSABLE) ×1 IMPLANT
STRIP CLOSURE SKIN 1/2X4 (GAUZE/BANDAGES/DRESSINGS) ×4 IMPLANT
SUT MNCRL AB 4-0 PS2 18 (SUTURE) ×3 IMPLANT
SUT STRATAFIX 0 PDS 27 VIOLET (SUTURE) ×3
SUT VIC AB 2-0 CT1 27 (SUTURE) ×6
SUT VIC AB 2-0 CT1 TAPERPNT 27 (SUTURE) ×3 IMPLANT
SUTURE STRATFX 0 PDS 27 VIOLET (SUTURE) ×1 IMPLANT
SYR 30ML LL (SYRINGE) ×3 IMPLANT
TRAY FOLEY CATH 14FR (SET/KITS/TRAYS/PACK) ×3 IMPLANT
TRAY FOLEY W/METER SILVER 16FR (SET/KITS/TRAYS/PACK) IMPLANT
WATER STERILE IRR 1000ML POUR (IV SOLUTION) ×6 IMPLANT
WRAP KNEE MAXI GEL POST OP (GAUZE/BANDAGES/DRESSINGS) ×3 IMPLANT
YANKAUER SUCT BULB TIP 10FT TU (MISCELLANEOUS) ×3 IMPLANT

## 2017-01-26 NOTE — Op Note (Signed)
OPERATIVE REPORT-TOTAL KNEE ARTHROPLASTY   Pre-operative diagnosis- Osteoarthritis  Right knee(s)  Post-operative diagnosis- Osteoarthritis Right knee(s)  Procedure-  Right  Total Knee Arthroplasty  Surgeon- Dione Plover. Aubrie Lucien, MD  Assistant- Arlee Muslim, PA-C   Anesthesia-  Adductor canal block and spinal  EBL-100 mL   Drains Hemovac  Tourniquet time- 33 minutes @ 626 mm Hg  Complications- None  Condition-PACU - hemodynamically stable.   Brief Clinical Note  [name] is a 76 y.o. year old female with end stage OA of her right knee with progressively worsening pain and dysfunction. She has constant pain, with activity and at rest and significant functional deficits with difficulties even with ADLs. She has had extensive non-op management including analgesics, injections of cortisone and viscosupplements, and home exercise program, but remains in significant pain with significant dysfunction.Radiographs show bone on bone arthritis lateral and patellofemoral. She presents now for right Total Knee Arthroplasty.    Procedure in detail---   The patient is brought into the operating room and positioned supine on the operating table. After successful administration of  Adductor canal block and spinal,   a tourniquet is placed high on the  Right thigh(s) and the lower extremity is prepped and draped in the usual sterile fashion. Time out is performed by the operating team and then the  Right lower extremity is wrapped in Esmarch, knee flexed and the tourniquet inflated to 300 mmHg.       A midline incision is made with a ten blade through the subcutaneous tissue to the level of the extensor mechanism. A fresh blade is used to make a medial parapatellar arthrotomy. Soft tissue over the proximal medial tibia is subperiosteally elevated to the joint line with a knife and into the semimembranosus bursa with a Cobb elevator. Soft tissue over the proximal lateral tibia is elevated with  attention being paid to avoiding the patellar tendon on the tibial tubercle. The patella is everted, knee flexed 90 degrees and the ACL and PCL are removed. Findings are bone on bone lateral and patellofemoral with massive global osteophytes.        The drill is used to create a starting hole in the distal femur and the canal is thoroughly irrigated with sterile saline to remove the fatty contents. The 5 degree Right  valgus alignment guide is placed into the femoral canal and the distal femoral cutting block is pinned to remove 10 mm off the distal femur. Resection is made with an oscillating saw.      The tibia is subluxed forward and the menisci are removed. The extramedullary alignment guide is placed referencing proximally at the medial aspect of the tibial tubercle and distally along the second metatarsal axis and tibial crest. The block is pinned to remove 32mm off the more deficient lateral  side. Resection is made with an oscillating saw. Size 3is the most appropriate size for the tibia and the proximal tibia is prepared with the modular drill and keel punch for that size.      The femoral sizing guide is placed and size 3 is most appropriate. Rotation is marked off the epicondylar axis and confirmed by creating a rectangular flexion gap at 90 degrees. The size 3 cutting block is pinned in this rotation and the anterior, posterior and chamfer cuts are made with the oscillating saw. The intercondylar block is then placed and that cut is made.      Trial size 3 tibial component, trial size 3 posterior stabilized  femur and a 12.5  mm posterior stabilized rotating platform insert trial is placed. Full extension is achieved with excellent varus/valgus and anterior/posterior balance throughout full range of motion. The patella is everted and thickness measured to be 22  mm. Free hand resection is taken to 12 mm, a 35 template is placed, lug holes are drilled, trial patella is placed, and it tracks normally.  Osteophytes are removed off the posterior femur with the trial in place. All trials are removed and the cut bone surfaces prepared with pulsatile lavage. Cement is mixed and once ready for implantation, the size 3 tibial implant, size  3 posterior stabilized femoral component, and the size 35 patella are cemented in place and the patella is held with the clamp. The trial insert is placed and the knee held in full extension. The Exparel (20 ml mixed with 60 ml saline) is injected into the extensor mechanism, posterior capsule, medial and lateral gutters and subcutaneous tissues.  All extruded cement is removed and once the cement is hard the permanent 12.5 mm posterior stabilized rotating platform insert is placed into the tibial tray.      The wound is copiously irrigated with saline solution and the extensor mechanism closed over a hemovac drain with #1 V-loc suture. The tourniquet is released for a total tourniquet time of 33  minutes. Flexion against gravity is 130 degrees and the patella tracks normally. Subcutaneous tissue is closed with 2.0 vicryl and subcuticular with running 4.0 Monocryl. The incision is cleaned and dried and steri-strips and a bulky sterile dressing are applied. The limb is placed into a knee immobilizer and the patient is awakened and transported to recovery in stable condition.      Please note that a surgical assistant was a medical necessity for this procedure in order to perform it in a safe and expeditious manner. Surgical assistant was necessary to retract the ligaments and vital neurovascular structures to prevent injury to them and also necessary for proper positioning of the limb to allow for anatomic placement of the prosthesis.   Dione Plover Azjah Pardo, MD    01/26/2017, 1:29 PM

## 2017-01-26 NOTE — Anesthesia Procedure Notes (Signed)
Procedure Name: MAC Date/Time: 01/26/2017 12:32 PM Performed by: Dione Booze, CRNA Pre-anesthesia Checklist: Patient identified, Emergency Drugs available, Suction available and Patient being monitored Patient Re-evaluated:Patient Re-evaluated prior to induction Oxygen Delivery Method: Simple face mask Placement Confirmation: positive ETCO2

## 2017-01-26 NOTE — Progress Notes (Signed)
AssistedDr. Lyndle Herrlich with right, ultrasound guided, adductor canal block. Side rails up, monitors on throughout procedure. See vital signs in flow sheet. Tolerated Procedure well.

## 2017-01-26 NOTE — Plan of Care (Signed)
Plan of care reviewed and discussed.  Questions answered to patient's satisfaction.

## 2017-01-26 NOTE — Anesthesia Procedure Notes (Signed)
Anesthesia Regional Block: Adductor canal block   Pre-Anesthetic Checklist: ,, timeout performed, Correct Patient, Correct Site, Correct Laterality, Correct Procedure, Correct Position, site marked, Risks and benefits discussed, pre-op evaluation,  At surgeon's request and post-op pain management  Laterality: Right  Prep: chloraprep       Needles:   Needle Type: Echogenic Needle     Needle Length: 10cm  Needle Gauge: 21     Additional Needles:   Procedures:,,,, ultrasound used (permanent image in chart),,,,  Narrative:  Start time: 01/26/2017 11:56 AM End time: 01/26/2017 12:01 PM Injection made incrementally with aspirations every 5 mL. Anesthesiologist: Lyndle Herrlich, MD

## 2017-01-26 NOTE — Transfer of Care (Signed)
Immediate Anesthesia Transfer of Care Note  Patient: [name]  Procedure(s) Performed: RIGHT TOTAL KNEE ARTHROPLASTY (Right Knee)  Patient Location: PACU  Anesthesia Type:MAC, Regional and Spinal  Level of Consciousness: awake, alert  and patient cooperative  Airway & Oxygen Therapy: Patient Spontanous Breathing and Patient connected to face mask oxygen  Post-op Assessment: Report given to RN and Post -op Vital signs reviewed and stable  Post vital signs: Reviewed and stable  Last Vitals:  Vitals:   01/26/17 1206 01/26/17 1211  BP:  (!) 113/57  Pulse: (!) 57 (!) 58  Resp: 10 12  Temp:    SpO2: 100% 100%    Last Pain:  Vitals:   01/26/17 1028  TempSrc:   PainSc: 5       Patients Stated Pain Goal: 4 (68/12/75 1700)  Complications: No apparent anesthesia complications

## 2017-01-26 NOTE — Anesthesia Preprocedure Evaluation (Signed)
Anesthesia Evaluation  Patient identified by MRN, date of birth, ID band Patient awake    Reviewed: Allergy & Precautions, H&P , Patient's Chart, lab work & pertinent test results  Airway Mallampati: II  TM Distance: >3 FB Neck ROM: full    Dental no notable dental hx.    Pulmonary    Pulmonary exam normal breath sounds clear to auscultation       Cardiovascular Exercise Tolerance: Good hypertension,  Rhythm:regular Rate:Normal     Neuro/Psych    GI/Hepatic   Endo/Other  Morbid obesity  Renal/GU      Musculoskeletal   Abdominal   Peds  Hematology   Anesthesia Other Findings   Reproductive/Obstetrics                             Anesthesia Physical Anesthesia Plan  ASA: III  Anesthesia Plan: Spinal   Post-op Pain Management:    Induction:   PONV Risk Score and Plan: 2 and Dexamethasone, Ondansetron and Treatment may vary due to age or medical condition  Airway Management Planned:   Additional Equipment:   Intra-op Plan:   Post-operative Plan:   Informed Consent: I have reviewed the patients History and Physical, chart, labs and discussed the procedure including the risks, benefits and alternatives for the proposed anesthesia with the patient or authorized representative who has indicated his/her understanding and acceptance.     Plan Discussed with:   Anesthesia Plan Comments: (  )        Anesthesia Quick Evaluation

## 2017-01-26 NOTE — Anesthesia Postprocedure Evaluation (Signed)
Anesthesia Post Note  Patient: [name]  Procedure(s) Performed: RIGHT TOTAL KNEE ARTHROPLASTY (Right Knee)     Patient location during evaluation: PACU Anesthesia Type: Spinal Level of consciousness: awake Pain management: satisfactory to patient Vital Signs Assessment: post-procedure vital signs reviewed and stable Respiratory status: spontaneous breathing Cardiovascular status: blood pressure returned to baseline Postop Assessment: no headache and spinal receding Anesthetic complications: no    Last Vitals:  Vitals:   01/26/17 1500 01/26/17 1527  BP: 116/71 130/69  Pulse: 64 67  Resp: 15 12  Temp: (!) 36 C (!) 36.3 C  SpO2: 100% 100%    Last Pain:  Vitals:   01/26/17 1500  TempSrc:   PainSc: 0-No pain                 Remy Voiles EDWARD

## 2017-01-26 NOTE — Interval H&P Note (Signed)
History and Physical Interval Note:  01/26/2017 10:18 AM  [name]  has presented today for surgery, with the diagnosis of Osteoarthritis Right knee  The various methods of treatment have been discussed with the patient and family. After consideration of risks, benefits and other options for treatment, the patient has consented to  Procedure(s): RIGHT TOTAL KNEE ARTHROPLASTY (Right) as a surgical intervention .  The patient's history has been reviewed, patient examined, no change in status, stable for surgery.  I have reviewed the patient's chart and labs.  Questions were answered to the patient's satisfaction.     Pilar Plate Merissa Renwick

## 2017-01-26 NOTE — Anesthesia Procedure Notes (Signed)
Spinal  Patient location during procedure: OR Staffing Anesthesiologist: Lyndle Herrlich, MD Spinal Block Patient position: sitting Prep: DuraPrep Patient monitoring: heart rate, blood pressure and continuous pulse ox Approach: right paramedian Location: L3-4 Injection technique: single-shot Needle Needle type: Sprotte  Needle gauge: 24 G Needle length: 9 cm Assessment Sensory level: T4 Additional Notes Spinal Dosage in OR  .75% Bupivicaine ml       1.6  RLD x 3 min

## 2017-01-27 LAB — BASIC METABOLIC PANEL
ANION GAP: 9 (ref 5–15)
BUN: 16 mg/dL (ref 6–20)
CHLORIDE: 103 mmol/L (ref 101–111)
CO2: 25 mmol/L (ref 22–32)
Calcium: 8.9 mg/dL (ref 8.9–10.3)
Creatinine, Ser: 1.2 mg/dL — ABNORMAL HIGH (ref 0.44–1.00)
GFR calc non Af Amer: 43 mL/min — ABNORMAL LOW (ref >60)
GFR, EST AFRICAN AMERICAN: 50 mL/min — AB (ref >60)
Glucose, Bld: 153 mg/dL — ABNORMAL HIGH (ref 65–99)
POTASSIUM: 4.7 mmol/L (ref 3.5–5.1)
SODIUM: 137 mmol/L (ref 135–145)

## 2017-01-27 LAB — CBC
HCT: 35.3 % — ABNORMAL LOW (ref 36.0–46.0)
HEMOGLOBIN: 11.9 g/dL — AB (ref 12.0–15.0)
MCH: 31.6 pg (ref 26.0–34.0)
MCHC: 33.7 g/dL (ref 30.0–36.0)
MCV: 93.6 fL (ref 78.0–100.0)
Platelets: 207 10*3/uL (ref 150–400)
RBC: 3.77 MIL/uL — AB (ref 3.87–5.11)
RDW: 13.2 % (ref 11.5–15.5)
WBC: 8.4 10*3/uL (ref 4.0–10.5)

## 2017-01-27 MED ORDER — OXYCODONE HCL 5 MG PO TABS: 5.0000 mg | ORAL_TABLET | ORAL | 0 refills | Status: DC | PRN

## 2017-01-27 MED ORDER — TRAMADOL HCL 50 MG PO TABS: 50.0000 mg | ORAL_TABLET | Freq: Four times a day (QID) | ORAL | Status: DC | PRN

## 2017-01-27 MED ORDER — TRAMADOL HCL 50 MG PO TABS: 50.0000 mg | ORAL_TABLET | Freq: Four times a day (QID) | ORAL | 0 refills | Status: DC | PRN

## 2017-01-27 MED ORDER — METHOCARBAMOL 500 MG PO TABS: 500.0000 mg | ORAL_TABLET | Freq: Four times a day (QID) | ORAL | 0 refills | Status: DC | PRN

## 2017-01-27 MED ORDER — RIVAROXABAN 10 MG PO TABS: 10.0000 mg | ORAL_TABLET | Freq: Every day | ORAL | 0 refills | Status: DC

## 2017-01-27 NOTE — Progress Notes (Signed)
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 Hx of L reverse TSA, back pain, DM.     PT Comments    Pt continues to participate well with therapy.    Follow Up Recommendations  DC plan and follow up therapy as arranged by surgeon(OP PT)     Equipment Recommendations  None recommended by PT    Recommendations for Other Services       Precautions / Restrictions Precautions Precautions: Fall;Knee Required Braces or Orthoses: Knee Immobilizer - Right Knee Immobilizer - Right: Discontinue once straight leg raise with < 10 degree lag Restrictions Weight Bearing Restrictions: No RLE Weight Bearing: Weight bearing as tolerated    Mobility  Bed Mobility Overal bed mobility: Needs Assistance Bed Mobility: Sit to Supine       Sit to supine: Supervision   General bed mobility comments: supervision for safety  Transfers Overall transfer level: Needs assistance Equipment used: Rolling walker (2 wheeled) Transfers: Sit to/from Stand Sit to Stand: Min guard         General transfer comment: close guard for safety.   Ambulation/Gait Ambulation/Gait assistance: Min guard Ambulation Distance (Feet): 100 Feet Assistive device: Rolling walker (2 wheeled) Gait Pattern/deviations: Step-to pattern;Step-through pattern;Decreased stride length     General Gait Details: close guard for safety. slow gait speed.    Stairs            Wheelchair Mobility    Modified Rankin (Stroke Patients Only)       Balance                                            Cognition Arousal/Alertness: Awake/alert Behavior During Therapy: WFL for tasks assessed/performed Overall Cognitive Status: Within Functional Limits for tasks assessed                                        Exercises    General Comments         Pertinent Vitals/Pain Pain Assessment: 0-10 Pain Score: 5  Pain Location: L knee with activity Pain Descriptors / Indicators: Aching;Sore Pain Intervention(s): Monitored during session;Repositioned    Home Living                      Prior Function            PT Goals (current goals can now be found in the care plan section) Acute Rehab PT Goals Patient Stated Goal: home soon. regain PLOF.  PT Goal Formulation: With patient Time For Goal Achievement: 02/10/17 Potential to Achieve Goals: Good Progress towards PT goals: Progressing toward goals    Frequency    7X/week      PT Plan      Co-evaluation              AM-PAC PT "6 Clicks" Daily Activity  Outcome Measure  Difficulty turning over in bed (including adjusting bedclothes, sheets and blankets)?: None Difficulty moving from lying on back to sitting on the side of the bed? : A Little Difficulty sitting down on and standing up from a chair with arms (e.g., wheelchair, bedside commode, etc,.)?: A Little Help needed  moving to and from a bed to chair (including a wheelchair)?: A Little Help needed walking in hospital room?: A Little Help needed climbing 3-5 steps with a railing? : A Little 6 Click Score: 19    End of Session Equipment Utilized During Treatment: Gait belt Activity Tolerance: Patient tolerated treatment well Patient left: in bed;with call bell/phone within reach   PT Visit Diagnosis: Muscle weakness (generalized) (M62.81);Difficulty in walking, not elsewhere classified (R26.2);Pain Pain - Right/Left: Right Pain - part of body: Knee     Time: 3202-3343 PT Time Calculation (min) (ACUTE ONLY): 9 min  Charges:  $Gait Training: 8-22 mins                    G Codes:          Weston Anna, MPT Pager: [phone]

## 2017-01-27 NOTE — Evaluation (Signed)
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 Hx of L reverse TSA, back pain, DM.    Clinical Impression   Pt educated in toilet and shower transfers, use of AE and DME, compensatory strategies for LB ADL and fall prevention. Pt demonstrating understanding of all information. Plans to go to daughter's home to recover. No further OT needs.    Follow Up Recommendations  No OT follow up    Equipment Recommendations  None recommended by OT    Recommendations for Other Services       Precautions / Restrictions Precautions Precautions: Fall;Knee Precaution Comments: pt aware of no pillow under knee Required Braces or Orthoses: Knee Immobilizer - Right Knee Immobilizer - Right: Discontinue once straight leg raise with < 10 degree lag Restrictions Weight Bearing Restrictions: No RLE Weight Bearing: Weight bearing as tolerated      Mobility Bed Mobility Overal bed mobility: Needs Assistance Bed Mobility: Supine to Sit;Sit to Supine     Supine to sit: Supervision Sit to supine: Supervision   General bed mobility comments: supervision for safety  Transfers Overall transfer level: Needs assistance Equipment used: Rolling walker (2 wheeled) Transfers: Sit to/from Stand Sit to Stand: Min guard         General transfer comment: close guard for safety.     Balance                                           ADL either performed or assessed with clinical judgement   ADL Overall ADL's : Needs assistance/impaired Eating/Feeding: Independent;Sitting   Grooming: Wash/dry hands;Standing;Min guard   Upper Body Bathing: Set up;Sitting   Lower Body Bathing: Minimal assistance;Sit to/from stand Lower Body Bathing Details (indicate cue type and reason): educated in use of reacher and long handled bath  sponge Upper Body Dressing : Set up;Sitting   Lower Body Dressing: Minimal assistance;Sit to/from stand Lower Body Dressing Details (indicate cue type and reason): educated in compensatory strategies and use of AE, educated in safe footwear Toilet Transfer: Min guard;Ambulation;BSC;RW   Toileting- Water quality scientist and Hygiene: Min guard;Sit to/from stand   Tub/ Shower Transfer: Min guard;Ambulation;3 in 1;Rolling walker Tub/Shower Transfer Details (indicate cue type and reason): educated in safe technique for shower transfer and to use 3 in 1 as shower seat Functional mobility during ADLs: Min guard;Rolling walker       Vision         Perception     Praxis      Pertinent Vitals/Pain Pain Assessment: Faces Pain Score: 5  Faces Pain Scale: Hurts little more Pain Location: R knee with activity Pain Descriptors / Indicators: Aching;Sore Pain Intervention(s): Monitored during session;Repositioned;Ice applied     Hand Dominance Right   Extremity/Trunk Assessment Upper Extremity Assessment Upper Extremity Assessment: LUE deficits/detail LUE Deficits / Details: hx of L reverse TSA LUE Coordination: decreased gross motor   Lower Extremity Assessment Lower Extremity Assessment: Defer to PT evaluation   Cervical / Trunk Assessment Cervical / Trunk Assessment: Normal   Communication Communication Communication: No difficulties   Cognition Arousal/Alertness: Awake/alert Behavior During Therapy: WFL for tasks assessed/performed Overall Cognitive Status: Within Functional Limits for tasks assessed  General Comments       Exercises    Shoulder Instructions      Home Living Family/patient expects to be discharged to:: Private residence Living Arrangements: Children Available Help at Discharge: Family(daughter) Type of Home: House Home Access: Stairs to enter [employer] of Steps: 4 Entrance  Stairs-Rails: Right Home Layout: Two level Alternate Level Stairs-Number of Steps: 1 flight with chair lift   Bathroom Shower/Tub: Occupational psychologist: Standard     Home Equipment: Environmental consultant - 2 wheels;Cane - single point;Bedside commode          Prior Functioning/Environment Level of Independence: Independent                 OT Problem List: Pain      OT Treatment/Interventions:      OT Goals(Current goals can be found in the care plan section) Acute Rehab OT Goals Patient Stated Goal: home soon. regain PLOF.   OT Frequency:     Barriers to D/C:            Co-evaluation              AM-PAC PT "6 Clicks" Daily Activity     Outcome Measure Help from another person eating meals?: None Help from another person taking care of personal grooming?: A Little Help from another person toileting, which includes using toliet, bedpan, or urinal?: A Little Help from another person bathing (including washing, rinsing, drying)?: A Little Help from another person to put on and taking off regular upper body clothing?: None Help from another person to put on and taking off regular lower body clothing?: A Little 6 Click Score: 20   End of Session Equipment Utilized During Treatment: Gait belt;Rolling walker  Activity Tolerance: Patient tolerated treatment well Patient left: in bed;with call bell/phone within reach  OT Visit Diagnosis: Unsteadiness on feet (R26.81);Pain Pain - Right/Left: Right Pain - part of body: Knee                Time: 4580-9983 OT Time Calculation (min): 22 min Charges:  OT General Charges $OT Visit: 1 Visit OT Evaluation $OT Eval Moderate Complexity: 1 Mod G-Codes:     Malka So 01/27/2017, 2:37 PM  01/27/2017 Nestor Lewandowsky, OTR/L Pager: [phone]

## 2017-01-27 NOTE — Discharge Instructions (Signed)
° °Dr. Frank Aluisio °Total Joint Specialist °[facility] °[address] °[place], [place] [address] °[phone] ° °TOTAL KNEE REPLACEMENT POSTOPERATIVE DIRECTIONS ° °Knee Rehabilitation, Guidelines Following Surgery  °Results after knee surgery are often greatly improved when you follow the exercise, range of motion and muscle strengthening exercises prescribed by your doctor. Safety measures are also important to protect the knee from further injury. Any time any of these exercises cause you to have increased pain or swelling in your knee joint, decrease the amount until you are comfortable again and slowly increase them. If you have problems or questions, call your caregiver or physical therapist for advice.  ° °HOME CARE INSTRUCTIONS  °Remove items at home which could result in a fall. This includes throw rugs or furniture in walking pathways.  °· ICE to the affected knee every three hours for 30 minutes at a time and then as needed for pain and swelling.  Continue to use ice on the knee for pain and swelling from surgery. You may notice swelling that will progress down to the foot and ankle.  This is normal after surgery.  Elevate the leg when you are not up walking on it.   °· Continue to use the breathing machine which will help keep your temperature down.  It is common for your temperature to cycle up and down following surgery, especially at night when you are not up moving around and exerting yourself.  The breathing machine keeps your lungs expanded and your temperature down. °· Do not place pillow under knee, focus on keeping the knee straight while resting ° °DIET °You may resume your previous home diet once your are discharged from the hospital. ° °DRESSING / WOUND CARE / SHOWERING °You may shower 3 days after surgery, but keep the wounds dry during showering.  You may use an occlusive plastic wrap (Press'n Seal for example), NO SOAKING/SUBMERGING IN THE BATHTUB.  If the  bandage gets wet, change with a clean dry gauze.  If the incision gets wet, pat the wound dry with a clean towel. °You may start showering once you are discharged home but do not submerge the incision under water. Just pat the incision dry and apply a dry gauze dressing on daily. °Change the surgical dressing daily and reapply a dry dressing each time. ° °ACTIVITY °Walk with your walker as instructed. °Use walker as long as suggested by your caregivers. °Avoid periods of inactivity such as sitting longer than an hour when not asleep. This helps prevent blood clots.  °You may resume a sexual relationship in one month or when given the OK by your doctor.  °You may return to work once you are cleared by your doctor.  °Do not drive a car for 6 weeks or until released by you surgeon.  °Do not drive while taking narcotics. ° °WEIGHT BEARING °Weight bearing as tolerated with assist device (walker, cane, etc) as directed, use it as long as suggested by your surgeon or therapist, typically at least 4-6 weeks. ° °POSTOPERATIVE CONSTIPATION PROTOCOL °Constipation - defined medically as fewer than three stools per week and severe constipation as less than one stool per week. ° °One of the most common issues patients have following surgery is constipation.  Even if you have a regular bowel pattern at home, your normal regimen is likely to be disrupted due to multiple reasons following surgery.  Combination of anesthesia, postoperative narcotics, change in appetite and fluid intake all can affect your bowels.    In order to avoid complications following surgery, here are some recommendations in order to help you during your recovery period. ° °Colace (docusate) - Pick up an over-the-counter form of Colace or another stool softener and take twice a day as long as you are requiring postoperative pain medications.  Take with a full glass of water daily.  If you experience loose stools or diarrhea, hold the colace until you stool forms  back up.  If your symptoms do not get better within 1 week or if they get worse, check with your doctor. ° °Dulcolax (bisacodyl) - Pick up over-the-counter and take as directed by the product packaging as needed to assist with the movement of your bowels.  Take with a full glass of water.  Use this product as needed if not relieved by Colace only.  ° °MiraLax (polyethylene glycol) - Pick up over-the-counter to have on hand.  MiraLax is a solution that will increase the amount of water in your bowels to assist with bowel movements.  Take as directed and can mix with a glass of water, juice, soda, coffee, or tea.  Take if you go more than two days without a movement. °Do not use MiraLax more than once per day. Call your doctor if you are still constipated or irregular after using this medication for 7 days in a row. ° °If you continue to have problems with postoperative constipation, please contact the office for further assistance and recommendations.  If you experience "the worst abdominal pain ever" or develop nausea or vomiting, please contact the office immediatly for further recommendations for treatment. ° °ITCHING ° If you experience itching with your medications, try taking only a single pain pill, or even half a pain pill at a time.  You can also use Benadryl over the counter for itching or also to help with sleep.  ° °TED HOSE STOCKINGS °Wear the elastic stockings on both legs for three weeks following surgery during the day but you may remove then at night for sleeping. ° °MEDICATIONS °See your medication summary on the “After Visit Summary” that the nursing staff will review with you prior to discharge.  You may have some home medications which will be placed on hold until you complete the course of blood thinner medication.  It is important for you to complete the blood thinner medication as prescribed by your surgeon.  Continue your approved medications as instructed at time of  discharge. ° °PRECAUTIONS °If you experience chest pain or shortness of breath - call 911 immediately for transfer to the hospital emergency department.  °If you develop a fever greater that 101 F, purulent drainage from wound, increased redness or drainage from wound, foul odor from the wound/dressing, or calf pain - CONTACT YOUR SURGEON.   °                                                °FOLLOW-UP APPOINTMENTS °Make sure you keep all of your appointments after your operation with your surgeon and caregivers. You should call the office at the above phone number and make an appointment for approximately two weeks after the date of your surgery or on the date instructed by your surgeon outlined in the "After Visit Summary". ° ° °RANGE OF MOTION AND STRENGTHENING EXERCISES  °Rehabilitation of the knee is important following a knee injury or   an operation. After just a few days of immobilization, the muscles of the thigh which control the knee become weakened and shrink (atrophy). Knee exercises are designed to build up the tone and strength of the thigh muscles and to improve knee motion. Often times heat used for twenty to thirty minutes before working out will loosen up your tissues and help with improving the range of motion but do not use heat for the first two weeks following surgery. These exercises can be done on a training (exercise) mat, on the floor, on a table or on a bed. Use what ever works the best and is most comfortable for you Knee exercises include:  °Leg Lifts - While your knee is still immobilized in a splint or cast, you can do straight leg raises. Lift the leg to 60 degrees, hold for 3 sec, and slowly lower the leg. Repeat 10-20 times 2-3 times daily. Perform this exercise against resistance later as your knee gets better.  °Quad and Hamstring Sets - Tighten up the muscle on the front of the thigh (Quad) and hold for 5-10 sec. Repeat this 10-20 times hourly. Hamstring sets are done by pushing the  foot backward against an object and holding for 5-10 sec. Repeat as with quad sets.  °· Leg Slides: Lying on your back, slowly slide your foot toward your buttocks, bending your knee up off the floor (only go as far as is comfortable). Then slowly slide your foot back down until your leg is flat on the floor again. °· Angel Wings: Lying on your back spread your legs to the side as far apart as you can without causing discomfort.  °A rehabilitation program following serious knee injuries can speed recovery and prevent re-injury in the future due to weakened muscles. Contact your doctor or a physical therapist for more information on knee rehabilitation.  ° °IF YOU ARE TRANSFERRED TO A SKILLED REHAB FACILITY °If the patient is transferred to a skilled rehab facility following release from the hospital, a list of the current medications will be sent to the facility for the patient to continue.  When discharged from the skilled rehab facility, please have the facility set up the patient's Home Health Physical Therapy prior to being released. Also, the skilled facility will be responsible for providing the patient with their medications at time of release from the facility to include their pain medication, the muscle relaxants, and their blood thinner medication. If the patient is still at the rehab facility at time of the two week follow up appointment, the skilled rehab facility will also need to assist the patient in arranging follow up appointment in our office and any transportation needs. ° °MAKE SURE YOU:  °Understand these instructions.  °Get help right away if you are not doing well or get worse.  ° ° °Pick up stool softner and laxative for home use following surgery while on pain medications. °Do not submerge incision under water. °Please use good hand washing techniques while changing dressing each day. °May shower starting three days after surgery. °Please use a clean towel to pat the incision dry following  showers. °Continue to use ice for pain and swelling after surgery. °Do not use any lotions or creams on the incision until instructed by your surgeon. ° °Take Xarelto for two and a half more weeks following discharge from the hospital, then discontinue Xarelto. °Once the patient has completed the Xarelto, they may resume the 81 mg Aspirin. ° ° ° °Information on   my medicine - XARELTO® (Rivaroxaban) ° °Why was Xarelto® prescribed for you? °Xarelto® was prescribed for you to reduce the risk of blood clots forming after orthopedic surgery. The medical term for these abnormal blood clots is venous thromboembolism (VTE). ° °What do you need to know about xarelto® ? °Take your Xarelto® ONCE DAILY at the same time every day. °You may take it either with or without food. ° °If you have difficulty swallowing the tablet whole, you may crush it and mix in applesauce just prior to taking your dose. ° °Take Xarelto® exactly as prescribed by your doctor and DO NOT stop taking Xarelto® without talking to the doctor who prescribed the medication.  Stopping without other VTE prevention medication to take the place of Xarelto® may increase your risk of developing a clot. ° °After discharge, you should have regular check-up appointments with your healthcare provider that is prescribing your Xarelto®.   ° °What do you do if you miss a dose? °If you miss a dose, take it as soon as you remember on the same day then continue your regularly scheduled once daily regimen the next day. Do not take two doses of Xarelto® on the same day.  ° °Important Safety Information °A possible side effect of Xarelto® is bleeding. You should call your healthcare provider right away if you experience any of the following: °? Bleeding from an injury or your nose that does not stop. °? Unusual colored urine (red or dark brown) or unusual colored stools (red or black). °? Unusual bruising for unknown reasons. °? A serious fall or if you hit your head (even if  there is no bleeding). ° °Some medicines may interact with Xarelto® and might increase your risk of bleeding while on Xarelto®. To help avoid this, consult your healthcare provider or pharmacist prior to using any new prescription or non-prescription medications, including herbals, vitamins, non-steroidal anti-inflammatory drugs (NSAIDs) and supplements. ° °This website has more information on Xarelto®: www.xarelto.com. ° ° ° °

## 2017-01-27 NOTE — Discharge Summary (Signed)
Physician Discharge Summary   Patient ID: [name] MRN: [id number] DOB/AGE: 1940-04-17 76 y.o.  Admit date: 01/26/2017 Discharge date: 01/28/17  Primary Diagnosis:  Osteoarthritis  Right knee(s)   Admission Diagnoses:  Past Medical History:  Diagnosis Date  . Anxiety   . Arthritis    ostearthritis. Degenerative disc disease- back, knees, hands  . Back pain   . Cataract 11/2016   hx - surgery to remove  . Hyperlipidemia   . Hypertension   . Hypothyroidism   . Pre-diabetes   . Renal insufficiency   . Renal insufficiency   . Rotator cuff disorder    Pain with limited ROM right, spur left shoulder  . Thyroid disease    Discharge Diagnoses:   Active Problems:   OA (osteoarthritis) of knee  Estimated body mass index is 41.98 kg/m as calculated from the following:   Height as of this encounter: _0  (1.6 m).   Weight as of this encounter: 107.5 kg (237 lb).  Procedure:  Procedure(s) (LRB): RIGHT TOTAL KNEE ARTHROPLASTY (Right)   Consults: None  HPI: [name] is a 76 y.o. year old female with end stage OA of her right knee with progressively worsening pain and dysfunction. She has constant pain, with activity and at rest and significant functional deficits with difficulties even with ADLs. She has had extensive non-op management including analgesics, injections of cortisone and viscosupplements, and home exercise program, but remains in significant pain with significant dysfunction.Radiographs show bone on bone arthritis lateral and patellofemoral. She presents now for right Total Knee Arthroplasty.     Laboratory Data: Admission on 01/26/2017  Component Date Value Ref Range Status  . WBC 01/27/2017 8.4  4.0 - 10.5 K/uL Final  . RBC 01/27/2017 3.77* 3.87 - 5.11 MIL/uL Final  . Hemoglobin 01/27/2017 11.9* 12.0 - 15.0 g/dL Final  . HCT 01/27/2017 35.3* 36.0 - 46.0 % Final  . MCV 01/27/2017 93.6  78.0 - 100.0 fL Final  . MCH 01/27/2017 31.6  26.0 - 34.0 pg  Final  . MCHC 01/27/2017 33.7  30.0 - 36.0 g/dL Final  . RDW 01/27/2017 13.2  11.5 - 15.5 % Final  . Platelets 01/27/2017 207  150 - 400 K/uL Final  . Sodium 01/27/2017 137  135 - 145 mmol/L Final  . Potassium 01/27/2017 4.7  3.5 - 5.1 mmol/L Final  . Chloride 01/27/2017 103  101 - 111 mmol/L Final  . CO2 01/27/2017 25  22 - 32 mmol/L Final  . Glucose, Bld 01/27/2017 153* 65 - 99 mg/dL Final  . BUN 01/27/2017 16  6 - 20 mg/dL Final  . Creatinine, Ser 01/27/2017 1.20* 0.44 - 1.00 mg/dL Final  . Calcium 01/27/2017 8.9  8.9 - 10.3 mg/dL Final  . GFR calc non Af Amer 01/27/2017 43* >60 mL/min Final  . GFR calc Af Amer 01/27/2017 50* >60 mL/min Final   Comment: (NOTE) The eGFR has been calculated using the CKD EPI equation. This calculation has not been validated in all clinical situations. eGFR's persistently <60 mL/min signify possible Chronic Kidney Disease.   [name] gap 01/27/2017 9  5 - 15 Final  Hospital Outpatient Visit on 01/22/2017  Component Date Value Ref Range Status  . aPTT 01/22/2017 31  24 - 36 seconds Final  . WBC 01/22/2017 3.5* 4.0 - 10.5 K/uL Final  . RBC 01/22/2017 4.01  3.87 - 5.11 MIL/uL Final  . Hemoglobin 01/22/2017 13.0  12.0 - 15.0 g/dL Final  . HCT 01/22/2017 38.1  36.0 - 46.0 % Final  . MCV 01/22/2017 95.0  78.0 - 100.0 fL Final  . MCH 01/22/2017 32.4  26.0 - 34.0 pg Final  . MCHC 01/22/2017 34.1  30.0 - 36.0 g/dL Final  . RDW 01/22/2017 13.4  11.5 - 15.5 % Final  . Platelets 01/22/2017 235  150 - 400 K/uL Final  . Sodium 01/22/2017 139  135 - 145 mmol/L Final  . Potassium 01/22/2017 4.1  3.5 - 5.1 mmol/L Final  . Chloride 01/22/2017 106  101 - 111 mmol/L Final  . CO2 01/22/2017 28  22 - 32 mmol/L Final  . Glucose, Bld 01/22/2017 102* 65 - 99 mg/dL Final  . BUN 01/22/2017 16  6 - 20 mg/dL Final  . Creatinine, Ser 01/22/2017 1.12* 0.44 - 1.00 mg/dL Final  . Calcium 01/22/2017 9.2  8.9 - 10.3 mg/dL Final  . Total Protein 01/22/2017 7.8  6.5 - 8.1 g/dL  Final  . Albumin 01/22/2017 3.8  3.5 - 5.0 g/dL Final  . AST 01/22/2017 19  15 - 41 U/L Final  . ALT 01/22/2017 10* 14 - 54 U/L Final  . Alkaline Phosphatase 01/22/2017 71  38 - 126 U/L Final  . Total Bilirubin 01/22/2017 0.7  0.3 - 1.2 mg/dL Final  . GFR calc non Af Amer 01/22/2017 46* >60 mL/min Final  . GFR calc Af Amer 01/22/2017 54* >60 mL/min Final   Comment: (NOTE) The eGFR has been calculated using the CKD EPI equation. This calculation has not been validated in all clinical situations. eGFR's persistently <60 mL/min signify possible Chronic Kidney Disease.   . Anion gap 01/22/2017 5  5 - 15 Final  . Prothrombin Time 01/22/2017 12.8  11.4 - 15.2 seconds Final  . INR 01/22/2017 0.97   Final  . ABO/RH(D) 01/22/2017 O POS   Final  . Antibody Screen 01/22/2017 NEG   Final  . Sample Expiration 01/22/2017 01/29/2017   Final  . Extend sample reason 01/22/2017 NO TRANSFUSIONS OR PREGNANCY IN THE PAST 3 MONTHS   Final  . Hgb A1c MFr Bld 01/22/2017 5.9* 4.8 - 5.6 % Final   Comment: (NOTE) Pre diabetes:          5.7%-6.4% Diabetes:              >6.4% Glycemic control for   <7.0% adults with diabetes   . Mean Plasma Glucose 01/22/2017 122.63  mg/dL Final   Performed at [facility] [address], [place], [place] [address]  . MRSA, PCR 01/22/2017 NEGATIVE  NEGATIVE Final  . Staphylococcus aureus 01/22/2017 NEGATIVE  NEGATIVE Final   Comment: (NOTE) The Xpert SA Assay (FDA approved for NASAL specimens in patients 28 years of age and older), is one component of a comprehensive surveillance program. It is not intended to diagnose infection nor to guide or monitor treatment.   Office Visit on 01/02/2017  Component Date Value Ref Range Status  . WBC 01/02/2017 3.6* 3.8 - 10.8 Thousand/uL Final  . RBC 01/02/2017 4.20  3.80 - 5.10 Million/uL Final  . Hemoglobin 01/02/2017 13.1  11.7 - 15.5 g/dL Final  . HCT 01/02/2017 38.4  35.0 - 45.0 % Final  . MCV 01/02/2017 91.4  80.0 -  100.0 fL Final  . MCH 01/02/2017 31.2  27.0 - 33.0 pg Final  . MCHC 01/02/2017 34.1  32.0 - 36.0 g/dL Final  . RDW 01/02/2017 12.7  11.0 - 15.0 % Final  . Platelets 01/02/2017 237  140 - 400 Thousand/uL  Final  . MPV 01/02/2017 10.5  7.5 - 12.5 fL Final  . Neutro Abs 01/02/2017 1,678  1,500 - 7,800 cells/uL Final  . Lymphs Abs 01/02/2017 1,404  850 - 3,900 cells/uL Final  . WBC mixed population 01/02/2017 446  200 - 950 cells/uL Final  . Eosinophils Absolute 01/02/2017 50  15 - 500 cells/uL Final  . Basophils Absolute 01/02/2017 22  0 - 200 cells/uL Final  . Neutrophils Relative % 01/02/2017 46.6  % Final  . Total Lymphocyte 01/02/2017 39.0  % Final  . Monocytes Relative 01/02/2017 12.4  % Final  . Eosinophils Relative 01/02/2017 1.4  % Final  . Basophils Relative 01/02/2017 0.6  % Final  . Glucose, Bld 01/02/2017 90  65 - 99 mg/dL Final   Comment: .            Fasting reference interval .   . BUN 01/02/2017 11  7 - 25 mg/dL Final  . Creat 01/02/2017 1.23* 0.60 - 0.93 mg/dL Final   Comment: For patients >62 years of age, the reference limit for Creatinine is approximately 13% higher for people identified as African-American. .   . GFR, Est Non African American 01/02/2017 43* > OR = 60 mL/min/1.64m Final  . GFR, Est African American 01/02/2017 49* > OR = 60 mL/min/1.747mFinal  . BUN/Creatinine Ratio 01/02/2017 9  6 - 22 (calc) Final  . Sodium 01/02/2017 139  135 - 146 mmol/L Final  . Potassium 01/02/2017 4.5  3.5 - 5.3 mmol/L Final  . Chloride 01/02/2017 101  98 - 110 mmol/L Final  . CO2 01/02/2017 29  20 - 32 mmol/L Final  . Calcium 01/02/2017 9.1  8.6 - 10.4 mg/dL Final  . Total Protein 01/02/2017 7.5  6.1 - 8.1 g/dL Final  . Albumin 01/02/2017 4.0  3.6 - 5.1 g/dL Final  . Globulin 01/02/2017 3.5  1.9 - 3.7 g/dL (calc) Final  . AG Ratio 01/02/2017 1.1  1.0 - 2.5 (calc) Final  . Total Bilirubin 01/02/2017 0.6  0.2 - 1.2 mg/dL Final  . Alkaline phosphatase (APISO) 01/02/2017  70  33 - 130 U/L Final  . AST 01/02/2017 17  10 - 35 U/L Final  . ALT 01/02/2017 9  6 - 29 U/L Final  . Hgb A1c MFr Bld 01/02/2017 5.7* <5.7 % of total Hgb Final   Comment: For someone without known diabetes, a hemoglobin  A1c value between 5.7% and 6.4% is consistent with prediabetes and should be confirmed with a  follow-up test. . For someone with known diabetes, a value <7% indicates that their diabetes is well controlled. A1c targets should be individualized based on duration of diabetes, age, comorbid conditions, and other considerations. . This assay result is consistent with an increased risk of diabetes. . Currently, no consensus exists regarding use of hemoglobin A1c for diagnosis of diabetes for children. .   . Mean Plasma Glucose 01/02/2017 117  (calc) Final  . eAG (mmol/L) 01/02/2017 6.5  (calc) Final  . Cholesterol 01/02/2017 198  <200 mg/dL Final  . HDL 01/02/2017 60  >50 mg/dL Final  . Triglycerides 01/02/2017 143  <150 mg/dL Final  . LDL Cholesterol (Calc) 01/02/2017 112* mg/dL (calc) Final   Comment: Reference range: <100 . Desirable range <100 mg/dL for primary prevention;   <70 mg/dL for patients with CHD or diabetic patients  with > or = 2 CHD risk factors. . Marland KitchenDL-C is now calculated using the Martin-Hopkins  calculation, which is a validated novel method  providing  better accuracy than the Friedewald equation in the  estimation of LDL-C.  Cresenciano Genre et al. Annamaria Helling. 2449;753(00): 2061-2068  (http://education.[employer].com/faq/FAQ164)   . Total CHOL/HDL Ratio 01/02/2017 3.3  <5.0 (calc) Final  . Non-HDL Cholesterol (Calc) 01/02/2017 138* <130 mg/dL (calc) Final   Comment: For patients with diabetes plus 1 major ASCVD risk  factor, treating to a non-HDL-C goal of <100 mg/dL  (LDL-C of <70 mg/dL) is considered a therapeutic  option.   . Creatinine, Urine 01/02/2017 297* 20 - 275 mg/dL Final  . Microalb, Ur 01/02/2017 1.6  mg/dL Final   Comment:  Reference Range Not established   . Microalb Creat Ratio 01/02/2017 5  <30 mcg/mg creat Final   Comment: . The ADA defines abnormalities in albumin excretion as follows: Marland Kitchen Category         Result (mcg/mg creatinine) . Normal                    <30 Microalbuminuria         30-299  Clinical albuminuria   > OR = 300 . The ADA recommends that at least two of three specimens collected within a 3-6 month period be abnormal before considering a patient to be within a diagnostic category.   Marland Kitchen TSH 01/02/2017 2.83  0.40 - 4.50 mIU/L Final  . Color, UA 01/05/2017 yellow   Final  . Clarity, UA 01/05/2017 clear   Final  . Glucose, UA 01/05/2017 negative   Final  . Bilirubin, UA 01/05/2017 negative   Final  . Ketones, UA 01/05/2017 negative   Final  . Spec Grav, UA 01/05/2017 1.025  1.010 - 1.025 Final  . Blood, UA 01/05/2017 negative   Final  . pH, UA 01/05/2017 7.0  5.0 - 8.0 Final  . Protein, UA 01/05/2017 negative   Final  . Urobilinogen, UA 01/05/2017 0.2  0.2 or 1.0 E.U./dL Final  . Nitrite, UA 01/05/2017 negative   Final  . Leukocytes, UA 01/05/2017 Negative  Negative Final     X-Rays:Mm Screening Breast Tomo Bilateral  Result Date: 01/02/2017 CLINICAL DATA:  Screening. EXAM: 2D DIGITAL SCREENING BILATERAL MAMMOGRAM WITH CAD AND ADJUNCT TOMO COMPARISON:  Previous exam(s). ACR Breast Density Category b: There are scattered areas of fibroglandular density. FINDINGS: There are no findings suspicious for malignancy. Images were processed with CAD. IMPRESSION: No mammographic evidence of malignancy. A result letter of this screening mammogram will be mailed directly to the patient. RECOMMENDATION: Screening mammogram in one year. (Code:SM-B-01Y) BI-RADS CATEGORY  1: Negative. Electronically Signed   By: Lajean Manes M.D.   On: 01/02/2017 16:11    EKG: Orders placed or performed during the hospital encounter of 08/01/16  . EKG 12-Lead  . EKG 12-Lead     Hospital Course: TAELER WINNING is a 76 y.o. who was admitted to [facility]. They were brought to the operating room on 01/26/2017 and underwent Procedure(s): RIGHT TOTAL KNEE ARTHROPLASTY.  Patient tolerated the procedure well and was later transferred to the recovery room and then to the orthopaedic floor for postoperative care.  They were given PO and IV analgesics for pain control following their surgery.  They were given 24 hours of postoperative antibiotics of  Anti-infectives (From admission, onward)   Start     Dose/Rate Route Frequency Ordered Stop   01/26/17 1830  ceFAZolin (ANCEF) IVPB 2g/100 mL premix     2 g 200 mL/hr over 30 Minutes Intravenous Every 6 hours 01/26/17 1540  01/27/17 0222   01/26/17 1016  ceFAZolin (ANCEF) 2-4 GM/100ML-% IVPB    Comments:  Harvell, Gwendolyn  : cabinet override      01/26/17 1016 01/26/17 1238   01/26/17 1007  ceFAZolin (ANCEF) IVPB 2g/100 mL premix     2 g 200 mL/hr over 30 Minutes Intravenous On call to O.R. 01/26/17 1007 01/26/17 1253     and started on DVT prophylaxis in the form of Xarelto.   PT and OT were ordered for total joint protocol.  Discharge planning consulted to help with postop disposition and equipment needs.  Patient had a good night on the evening of surgery.  They started to get up OOB with therapy on day one. Hemovac drain was pulled without difficulty.  Continued to work with therapy into day two.  Dressing was changed on day two and the incision was healing well.  Patient was seen in rounds on POD 2, in good spirits, and was ready to go home.  Diet - Cardiac diet and Diabetic diet Follow up - in 2 weeks Activity - WBAT Disposition - Home Condition Upon Discharge - Good D/C Meds - See DC Summary DVT Prophylaxis - Xarelto     Discharge Instructions    Call MD / Call 911   Complete by:  As directed    If you experience chest pain or shortness of breath, CALL 911 and be transported to the hospital emergency room.  If you develope a fever  above 101 F, pus (white drainage) or increased drainage or redness at the wound, or calf pain, call your surgeon's office.   Change dressing   Complete by:  As directed    Change dressing daily with sterile 4 x 4 inch gauze dressing and apply TED hose. Do not submerge the incision under water.   Constipation Prevention   Complete by:  As directed    Drink plenty of fluids.  Prune juice may be helpful.  You may use a stool softener, such as Colace (over the counter) 100 mg twice a day.  Use MiraLax (over the counter) for constipation as needed.   Diet - low sodium heart healthy   Complete by:  As directed    Discharge instructions   Complete by:  As directed    Take Xarelto for two and a half more weeks, then discontinue Xarelto. Once the patient has completed the Xarelto, they may resume the 81 mg Aspirin.   Pick up stool softner and laxative for home use following surgery while on pain medications. Do not submerge incision under water. Please use good hand washing techniques while changing dressing each day. May shower starting three days after surgery. Please use a clean towel to pat the incision dry following showers. Continue to use ice for pain and swelling after surgery. Do not use any lotions or creams on the incision until instructed by your surgeon.  Wear both TED hose on both legs during the day every day for three weeks, but may remove the TED hose at night at home.  Postoperative Constipation Protocol  Constipation - defined medically as fewer than three stools per week and severe constipation as less than one stool per week.  One of the most common issues patients have following surgery is constipation.  Even if you have a regular bowel pattern at home, your normal regimen is likely to be disrupted due to multiple reasons following surgery.  Combination of anesthesia, postoperative narcotics, change in appetite and fluid intake all  can affect your bowels.  In order to avoid  complications following surgery, here are some recommendations in order to help you during your recovery period.  Colace (docusate) - Pick up an over-the-counter form of Colace or another stool softener and take twice a day as long as you are requiring postoperative pain medications.  Take with a full glass of water daily.  If you experience loose stools or diarrhea, hold the colace until you stool forms back up.  If your symptoms do not get better within 1 week or if they get worse, check with your doctor.  Dulcolax (bisacodyl) - Pick up over-the-counter and take as directed by the product packaging as needed to assist with the movement of your bowels.  Take with a full glass of water.  Use this product as needed if not relieved by Colace only.   MiraLax (polyethylene glycol) - Pick up over-the-counter to have on hand.  MiraLax is a solution that will increase the amount of water in your bowels to assist with bowel movements.  Take as directed and can mix with a glass of water, juice, soda, coffee, or tea.  Take if you go more than two days without a movement. Do not use MiraLax more than once per day. Call your doctor if you are still constipated or irregular after using this medication for 7 days in a row.  If you continue to have problems with postoperative constipation, please contact the office for further assistance and recommendations.  If you experience "the worst abdominal pain ever" or develop nausea or vomiting, please contact the office immediatly for further recommendations for treatment.   Do not put a pillow under the knee. Place it under the heel.   Complete by:  As directed    Do not sit on low chairs, stoools or toilet seats, as it may be difficult to get up from low surfaces   Complete by:  As directed    Driving restrictions   Complete by:  As directed    No driving until released by the physician.   Increase activity slowly as tolerated   Complete by:  As directed    Lifting  restrictions   Complete by:  As directed    No lifting until released by the physician.   Patient may shower   Complete by:  As directed    You may shower without a dressing once there is no drainage.  Do not wash over the wound.  If drainage remains, do not shower until drainage stops.   TED hose   Complete by:  As directed    Use stockings (TED hose) for 3 weeks on both leg(s).  You may remove them at night for sleeping.   Weight bearing as tolerated   Complete by:  As directed    Laterality:  right   Extremity:  Lower     Allergies as of 01/27/2017      Reactions   Prinivil [lisinopril] Cough      Medication List    STOP taking these medications   aspirin EC 81 MG tablet   HYDROcodone-acetaminophen 5-325 MG tablet Commonly known as:  NORCO     TAKE these medications   amLODipine 5 MG tablet Commonly known as:  NORVASC Take 1 tablet (5 mg total) by mouth daily.   furosemide 40 MG tablet Commonly known as:  LASIX Take 1 tablet (40 mg total) by mouth daily.   levothyroxine 75 MCG tablet Commonly known as:  SYNTHROID, LEVOTHROID Take 1 tablet (75 mcg total) by mouth daily before breakfast.   methocarbamol 500 MG tablet Commonly known as:  ROBAXIN Take 1 tablet (500 mg total) by mouth every 6 (six) hours as needed for muscle spasms.   metoprolol succinate 25 MG 24 hr tablet Commonly known as:  TOPROL-XL Take 1 tablet (25 mg total) by mouth daily.   oxyCODONE 5 MG immediate release tablet Commonly known as:  Oxy IR/ROXICODONE Take 1-2 tablets (5-10 mg total) by mouth every 4 (four) hours as needed for moderate pain or severe pain.   potassium chloride 10 MEQ tablet Commonly known as:  K-DUR Take 1 tablet (10 mEq total) by mouth daily.   rivaroxaban 10 MG Tabs tablet Commonly known as:  XARELTO Take 1 tablet (10 mg total) by mouth daily with breakfast. Take Xarelto for two and a half more weeks following discharge from the hospital, then discontinue  Xarelto. Once the patient has completed the Xarelto, they may resume the 81 mg Aspirin. Start taking on:  01/28/2017   simvastatin 10 MG tablet Commonly known as:  ZOCOR Take 1 tablet (10 mg total) by mouth every evening.   traMADol 50 MG tablet Commonly known as:  ULTRAM Take 1-2 tablets (50-100 mg total) by mouth every 6 (six) hours as needed for moderate pain.            Discharge Care Instructions  (From admission, onward)        Start     Ordered   01/27/17 0000  Weight bearing as tolerated    Question Answer Comment  Laterality right   Extremity Lower      01/27/17 2207   01/27/17 0000  Change dressing    Comments:  Change dressing daily with sterile 4 x 4 inch gauze dressing and apply TED hose. Do not submerge the incision under water.   01/27/17 2207     Follow-up Information    Gaynelle Arabian, MD. Schedule an appointment as soon as possible for a visit on 02/10/2017.   Specialty:  Orthopedic Surgery Contact information: [address] [place] [address] [phone]           Signed: Arlee Muslim, PA-C Orthopaedic Surgery 01/27/2017, 10:08 PM

## 2017-01-27 NOTE — Progress Notes (Signed)
Spoke with patient at bedside. Confirmed plan for OP PT, already arranged. Has RW and 3n1. [phone] 

## 2017-01-27 NOTE — Evaluation (Signed)
Physical Therapy Evaluation Patient Details Name: [name] MRN: [id number] DOB: 11/06/1940 Today's Date: 01/27/2017   History of Present Illness  76 yo female s/p R TKA 01/26/17. Hx of L reverse TSA, back pain, DM.   Clinical Impression  On eval, pt required Min guard assist for mobility. She walked ~75 feet with a RW. Pain rated 5/10 with activity. Will follow and progress activity as tolerated.     Follow Up Recommendations DC plan and follow up therapy as arranged by surgeon (OP PT)    Equipment Recommendations  None recommended by PT    Recommendations for Other Services       Precautions / Restrictions Precautions Precautions: Fall;Knee Required Braces or Orthoses: Knee Immobilizer - Right Knee Immobilizer - Right: Discontinue once straight leg raise with < 10 degree lag Restrictions Weight Bearing Restrictions: No RLE Weight Bearing: Weight bearing as tolerated      Mobility  Bed Mobility               General bed mobility comments: oob in recliner  Transfers Overall transfer level: Needs assistance Equipment used: Rolling walker (2 wheeled) Transfers: Sit to/from Stand Sit to Stand: Min guard         General transfer comment: close guard for safety. VCs safety, hand placement.   Ambulation/Gait Ambulation/Gait assistance: Min guard Ambulation Distance (Feet): 75 Feet Assistive device: Rolling walker (2 wheeled) Gait Pattern/deviations: Step-to pattern;Step-through pattern;Decreased stride length     General Gait Details: close guard for safety. Increased pain with ambulation but pt was able to participate well.   Stairs            Wheelchair Mobility    Modified Rankin (Stroke Patients Only)       Balance                                             Pertinent Vitals/Pain Pain Assessment: 0-10 Pain Score: 5  Pain Location: L knee with activity Pain Descriptors / Indicators: Aching;Sore Pain  Intervention(s): Monitored during session;Ice applied    Home Living Family/patient expects to be discharged to:: Private residence Living Arrangements: Children Available Help at Discharge: Family(pt is planning to d/c to daughter's home) Type of Home: House Home Access: Stairs to enter Entrance Stairs-Rails: Right Entrance Stairs-Number of Steps: 4 Home Layout: Two level Home Equipment: Rodney - 2 wheels;Cane - single point;Bedside commode      Prior Function Level of Independence: Independent               Hand Dominance        Extremity/Trunk Assessment   Upper Extremity Assessment Upper Extremity Assessment: Defer to OT evaluation    Lower Extremity Assessment Lower Extremity Assessment: Generalized weakness(s/p R TKA)    Cervical / Trunk Assessment Cervical / Trunk Assessment: Normal  Communication   Communication: No difficulties  Cognition Arousal/Alertness: Awake/alert Behavior During Therapy: WFL for tasks assessed/performed Overall Cognitive Status: Within Functional Limits for tasks assessed                                        General Comments      Exercises Total Joint Exercises Ankle Circles/Pumps: AROM;Both;10 reps;Seated Quad Sets: AROM;Both;10 reps;Seated Heel Slides: AAROM;Right;10 reps;Supine Hip ABduction/ADduction: AAROM;Right;10 reps;Supine Straight Leg  Raises: AAROM;Right;10 reps;Supine Goniometric ROM: ~10-65 degrees   Assessment/Plan    PT Assessment Patient needs continued PT services  PT Problem List Decreased strength;Decreased balance;Decreased range of motion;Decreased mobility;Decreased activity tolerance;Pain;Decreased knowledge of use of DME;Decreased knowledge of precautions       PT Treatment Interventions DME instruction;Gait training;Functional mobility training;Therapeutic activities;Balance training;Patient/family education;Therapeutic exercise;Stair training    PT Goals (Current goals can be  found in the Care Plan section)  Acute Rehab PT Goals Patient Stated Goal: home soon. regain PLOF.  PT Goal Formulation: With patient Time For Goal Achievement: 02/10/17 Potential to Achieve Goals: Good    Frequency 7X/week   Barriers to discharge        Co-evaluation               AM-PAC PT "6 Clicks" Daily Activity  Outcome Measure Difficulty turning over in bed (including adjusting bedclothes, sheets and blankets)?: A Little Difficulty moving from lying on back to sitting on the side of the bed? : A Little Difficulty sitting down on and standing up from a chair with arms (e.g., wheelchair, bedside commode, etc,.)?: A Little Help needed moving to and from a bed to chair (including a wheelchair)?: A Little Help needed walking in hospital room?: A Little Help needed climbing 3-5 steps with a railing? : A Lot 6 Click Score: 17    End of Session Equipment Utilized During Treatment: Gait belt Activity Tolerance: Patient tolerated treatment well Patient left: in chair;with call bell/phone within reach   PT Visit Diagnosis: Muscle weakness (generalized) (M62.81);Difficulty in walking, not elsewhere classified (R26.2);Pain Pain - Right/Left: Right Pain - part of body: Knee    Time: 7989-2119 PT Time Calculation (min) (ACUTE ONLY): 16 min   Charges:   PT Evaluation $PT Eval Moderate Complexity: 1 Mod     PT G Codes:          Weston Anna, MPT Pager: [phone]

## 2017-01-27 NOTE — Progress Notes (Signed)
   Subjective: 1 Day Post-Op Procedure(s) (LRB): RIGHT TOTAL KNEE ARTHROPLASTY (Right) Patient reports pain as mild.   Patient seen in rounds for Dr. Wynelle Link. She is doing very well this morning.  In good spirits. Patient is well, but has had some minor complaints of pain in the knee, requiring pain medications We will start therapy today.  Plan is to go Home after hospital stay.  Objective: Vital signs in last 24 hours: Temp:  [96.8 F (36 C)-98.2 F (36.8 C)] 97.9 F (36.6 C) (11/20 0611) Pulse Rate:  [55-78] 60 (11/20 0611) Resp:  [10-21] 11 (11/20 0611) BP: (105-135)/(53-82) 128/72 (11/20 0611) SpO2:  [96 %-100 %] 100 % (11/20 0611) Weight:  [107.5 kg (237 lb)] 107.5 kg (237 lb) (11/19 1028)  Intake/Output from previous day:  Intake/Output Summary (Last 24 hours) at 01/27/2017 1024 Last data filed at 01/27/2017 0900 Gross per 24 hour  Intake 4119.25 ml  Output 1830 ml  Net 2289.25 ml    Intake/Output this shift: Total I/O In: 360 [P.O.:360] Out: 200 [Urine:200]  Labs: Recent Labs    01/27/17 0552  HGB 11.9*   Recent Labs    01/27/17 0552  WBC 8.4  RBC 3.77*  HCT 35.3*  PLT 207   Recent Labs    01/27/17 0552  NA 137  K 4.7  CL 103  CO2 25  BUN 16  CREATININE 1.20*  GLUCOSE 153*  CALCIUM 8.9   No results for input(s): LABPT, INR in the last 72 hours.  EXAM General - Patient is Alert, Appropriate and Oriented Extremity - Neurovascular intact Sensation intact distally Intact pulses distally Dorsiflexion/Plantar flexion intact Dressing - dressing C/D/I Motor Function - intact, moving foot and toes well on exam.  Hemovac pulled without difficulty.  Past Medical History:  Diagnosis Date  . Anxiety   . Arthritis    ostearthritis. Degenerative disc disease- back, knees, hands  . Back pain   . Cataract 11/2016   hx - surgery to remove  . Hyperlipidemia   . Hypertension   . Hypothyroidism   . Pre-diabetes   . Renal insufficiency   .  Renal insufficiency   . Rotator cuff disorder    Pain with limited ROM right, spur left shoulder  . Thyroid disease     Assessment/Plan: 1 Day Post-Op Procedure(s) (LRB): RIGHT TOTAL KNEE ARTHROPLASTY (Right) Active Problems:   OA (osteoarthritis) of knee  Estimated body mass index is 41.98 kg/m as calculated from the following:   Height as of this encounter: 5\' 3"  (1.6 m).   Weight as of this encounter: 107.5 kg (237 lb). Advance diet Up with therapy Plan for discharge tomorrow Discharge home - outpatient therapy  DVT Prophylaxis - Xarelto Weight-Bearing as tolerated to right leg D/C O2 and Pulse OX and try on Room Air  Arlee Muslim, PA-C Orthopaedic Surgery 01/27/2017, 10:24 AM

## 2017-01-28 LAB — CBC
HCT: 34.6 % — ABNORMAL LOW (ref 36.0–46.0)
Hemoglobin: 11.7 g/dL — ABNORMAL LOW (ref 12.0–15.0)
MCH: 31.6 pg (ref 26.0–34.0)
MCHC: 33.8 g/dL (ref 30.0–36.0)
MCV: 93.5 fL (ref 78.0–100.0)
PLATELETS: 218 10*3/uL (ref 150–400)
RBC: 3.7 MIL/uL — AB (ref 3.87–5.11)
RDW: 13.6 % (ref 11.5–15.5)
WBC: 14 10*3/uL — AB (ref 4.0–10.5)

## 2017-01-28 LAB — BASIC METABOLIC PANEL
ANION GAP: 7 (ref 5–15)
BUN: 19 mg/dL (ref 6–20)
CALCIUM: 9 mg/dL (ref 8.9–10.3)
CO2: 24 mmol/L (ref 22–32)
Chloride: 105 mmol/L (ref 101–111)
Creatinine, Ser: 1.06 mg/dL — ABNORMAL HIGH (ref 0.44–1.00)
GFR calc Af Amer: 58 mL/min — ABNORMAL LOW (ref >60)
GFR, EST NON AFRICAN AMERICAN: 50 mL/min — AB (ref >60)
Glucose, Bld: 129 mg/dL — ABNORMAL HIGH (ref 65–99)
POTASSIUM: 4.7 mmol/L (ref 3.5–5.1)
SODIUM: 136 mmol/L (ref 135–145)

## 2017-01-28 MED FILL — oxyCODONE HCL 5 MG TABS: 5 | 7 days supply | Qty: 80 | Fill #0

## 2017-01-28 MED FILL — XARELTO 10 MG TABLET: 10 | 19 days supply | Qty: 19 | Fill #0

## 2017-01-28 MED FILL — METHOCARBAMOL 500 MG TABS: 500 | 20 days supply | Qty: 80 | Fill #0

## 2017-01-28 NOTE — Progress Notes (Signed)
Physical Therapy Treatment Patient Details Name: [name] MRN: [id number] DOB: [birth year] Today's Date: 01/28/2017    History of Present Illness 76 yo female s/p R TKA 01/26/17. Hx of L reverse TSA, back pain, DM.     PT Comments    Progressing very well with mobility. Reviewed/praticed exercises, gait training, and stair training. Issued HEP for pt to perform 2x/day until she begins OP PT.  Explained importance of diligence with HEP since pt doesn't have an OP PT appt until next week. Ready to d/c from PT standpoint-made RN aware.    Follow Up Recommendations  DC plan and follow up therapy as arranged by surgeon(OP PT)     Equipment Recommendations  None recommended by PT    Recommendations for Other Services       Precautions / Restrictions Precautions Precautions: Fall;Knee Restrictions Weight Bearing Restrictions: No RLE Weight Bearing: Weight bearing as tolerated    Mobility  Bed Mobility               General bed mobility comments: oob in recliner  Transfers Overall transfer level: Needs assistance Equipment used: Rolling walker (2 wheeled) Transfers: Sit to/from Stand Sit to Stand: Min guard         General transfer comment: close guard for safety.   Ambulation/Gait Ambulation/Gait assistance: Min guard Ambulation Distance (Feet): 100 Feet Assistive device: Rolling walker (2 wheeled) Gait Pattern/deviations: Step-to pattern;Step-through pattern;Decreased stride length     General Gait Details: close guard for safety. slow gait speed.    Stairs Stairs: Yes  Min guard Stair Management: Step to pattern;Sideways;Forwards;One rail Right Number of Stairs: 2 General stair comments: VCs safety, technique, sequence. Close guard for safety. Up and over portable steps.   Wheelchair Mobility    Modified Rankin (Stroke Patients Only)       Balance                                            Cognition Arousal/Alertness:  Awake/alert Behavior During Therapy: WFL for tasks assessed/performed Overall Cognitive Status: Within Functional Limits for tasks assessed                                        Exercises Total Joint Exercises Ankle Circles/Pumps: AROM;Both;10 reps;Seated Quad Sets: AROM;Both;10 reps;Seated Hip ABduction/ADduction: AAROM;Right;10 reps;Supine Straight Leg Raises: AAROM;Right;10 reps;Supine Knee Flexion: AROM;Right;10 reps;Seated Goniometric ROM: ~10-85    General Comments        Pertinent Vitals/Pain Pain Assessment: 0-10 Pain Score: 5  Pain Location: R knee with activity Pain Descriptors / Indicators: Aching;Sore Pain Intervention(s): Monitored during session;Ice applied    Home Living                      Prior Function            PT Goals (current goals can now be found in the care plan section) Progress towards PT goals: Progressing toward goals    Frequency    7X/week      PT Plan Current plan remains appropriate    Co-evaluation              AM-PAC PT "6 Clicks" Daily Activity  Outcome Measure  Difficulty turning over in bed (including adjusting bedclothes, sheets and blankets)?:  None Difficulty moving from lying on back to sitting on the side of the bed? : None Difficulty sitting down on and standing up from a chair with arms (e.g., wheelchair, bedside commode, etc,.)?: A Little Help needed moving to and from a bed to chair (including a wheelchair)?: A Little Help needed walking in hospital room?: A Little Help needed climbing 3-5 steps with a railing? : A Little 6 Click Score: 20    End of Session Equipment Utilized During Treatment: Gait belt Activity Tolerance: Patient tolerated treatment well Patient left: in chair;with call bell/phone within reach   PT Visit Diagnosis: Muscle weakness (generalized) (M62.81);Difficulty in walking, not elsewhere classified (R26.2);Pain Pain - Right/Left: Right Pain - part of  body: Knee     Time: 3128-1188 PT Time Calculation (min) (ACUTE ONLY): 15 min  Charges:  $Gait Training: 8-22 mins                    G Codes:          Weston Anna, MPT Pager: [phone]

## 2017-01-28 NOTE — Progress Notes (Signed)
   Subjective: 2 Days Post-Op Procedure(s) (LRB): RIGHT TOTAL KNEE ARTHROPLASTY (Right) Patient reports pain as mild.   Patient seen in rounds for Dr. Wynelle Link.  Mrs. Berhow did very well with her therapy yesterday.   Feels good and is in good spirits this morning. Patient is well, but has had some minor complaints of pain in the knee, requiring pain medications Patient is ready to go home this morning following therapy goals.  Objective: Vital signs in last 24 hours: Temp:  [97.6 F (36.4 C)-98.6 F (37 C)] 98.6 F (37 C) (11/21 4008) Pulse Rate:  [58-65] 58 (11/21 0619) Resp:  [12-13] 13 (11/21 0619) BP: (136-141)/(68-85) 138/85 (11/21 0619) SpO2:  [93 %-99 %] 97 % (11/21 0619)  Intake/Output from previous day:  Intake/Output Summary (Last 24 hours) at 01/28/2017 0811 Last data filed at 01/28/2017 0754 Gross per 24 hour  Intake 2041.67 ml  Output 1450 ml  Net 591.67 ml    Intake/Output this shift: Total I/O In: 360 [P.O.:360] Out: -   Labs: Recent Labs    01/27/17 0552 01/28/17 0549  HGB 11.9* 11.7*   Recent Labs    01/27/17 0552 01/28/17 0549  WBC 8.4 14.0*  RBC 3.77* 3.70*  HCT 35.3* 34.6*  PLT 207 218   Recent Labs    01/27/17 0552 01/28/17 0549  NA 137 136  K 4.7 4.7  CL 103 105  CO2 25 24  BUN 16 19  CREATININE 1.20* 1.06*  GLUCOSE 153* 129*  CALCIUM 8.9 9.0   No results for input(s): LABPT, INR in the last 72 hours.  EXAM: General - Patient is Alert, Appropriate and Oriented Extremity - Neurovascular intact Sensation intact distally Intact pulses distally Incision - clean, dry, no drainage, healing Motor Function - intact, moving foot and toes well on exam.   Assessment/Plan: 2 Days Post-Op Procedure(s) (LRB): RIGHT TOTAL KNEE ARTHROPLASTY (Right) Procedure(s) (LRB): RIGHT TOTAL KNEE ARTHROPLASTY (Right) Past Medical History:  Diagnosis Date  . Anxiety   . Arthritis    ostearthritis. Degenerative disc disease- back, knees,  hands  . Back pain   . Cataract 11/2016   hx - surgery to remove  . Hyperlipidemia   . Hypertension   . Hypothyroidism   . Pre-diabetes   . Renal insufficiency   . Renal insufficiency   . Rotator cuff disorder    Pain with limited ROM right, spur left shoulder  . Thyroid disease    Active Problems:   OA (osteoarthritis) of knee  Estimated body mass index is 41.98 kg/m as calculated from the following:   Height as of this encounter: 5\' 3"  (1.6 m).   Weight as of this encounter: 107.5 kg (237 lb). Up with therapy Diet - Cardiac diet and Diabetic diet Follow up - in 2 weeks Activity - WBAT Disposition - Home Condition Upon Discharge - Good D/C Meds - See DC Summary DVT Prophylaxis - Xarelto  Arlee Muslim, PA-C Orthopaedic Surgery 01/28/2017, 8:11 AM

## 2017-02-02 DIAGNOSIS — R269 Unspecified abnormalities of gait and mobility: Secondary | ICD-10-CM | POA: Diagnosis not present

## 2017-02-02 DIAGNOSIS — M6281 Muscle weakness (generalized): Secondary | ICD-10-CM | POA: Diagnosis not present

## 2017-02-02 DIAGNOSIS — Z471 Aftercare following joint replacement surgery: Secondary | ICD-10-CM | POA: Diagnosis not present

## 2017-02-04 DIAGNOSIS — M6281 Muscle weakness (generalized): Secondary | ICD-10-CM | POA: Diagnosis not present

## 2017-02-04 DIAGNOSIS — R269 Unspecified abnormalities of gait and mobility: Secondary | ICD-10-CM | POA: Diagnosis not present

## 2017-02-04 DIAGNOSIS — Z471 Aftercare following joint replacement surgery: Secondary | ICD-10-CM | POA: Diagnosis not present

## 2017-02-10 DIAGNOSIS — M1711 Unilateral primary osteoarthritis, right knee: Secondary | ICD-10-CM | POA: Diagnosis not present

## 2017-02-12 ENCOUNTER — Other Ambulatory Visit: Payer: Self-pay | Admitting: Internal Medicine

## 2017-02-12 DIAGNOSIS — M6281 Muscle weakness (generalized): Secondary | ICD-10-CM | POA: Diagnosis not present

## 2017-02-12 DIAGNOSIS — R269 Unspecified abnormalities of gait and mobility: Secondary | ICD-10-CM | POA: Diagnosis not present

## 2017-02-12 DIAGNOSIS — Z471 Aftercare following joint replacement surgery: Secondary | ICD-10-CM | POA: Diagnosis not present

## 2017-02-13 DIAGNOSIS — Z471 Aftercare following joint replacement surgery: Secondary | ICD-10-CM | POA: Diagnosis not present

## 2017-02-13 DIAGNOSIS — R269 Unspecified abnormalities of gait and mobility: Secondary | ICD-10-CM | POA: Diagnosis not present

## 2017-02-13 DIAGNOSIS — M6281 Muscle weakness (generalized): Secondary | ICD-10-CM | POA: Diagnosis not present

## 2017-02-16 DIAGNOSIS — R269 Unspecified abnormalities of gait and mobility: Secondary | ICD-10-CM | POA: Diagnosis not present

## 2017-02-16 DIAGNOSIS — M6281 Muscle weakness (generalized): Secondary | ICD-10-CM | POA: Diagnosis not present

## 2017-02-16 DIAGNOSIS — Z471 Aftercare following joint replacement surgery: Secondary | ICD-10-CM | POA: Diagnosis not present

## 2017-02-18 DIAGNOSIS — M6281 Muscle weakness (generalized): Secondary | ICD-10-CM | POA: Diagnosis not present

## 2017-02-18 DIAGNOSIS — R269 Unspecified abnormalities of gait and mobility: Secondary | ICD-10-CM | POA: Diagnosis not present

## 2017-02-18 DIAGNOSIS — Z471 Aftercare following joint replacement surgery: Secondary | ICD-10-CM | POA: Diagnosis not present

## 2017-02-23 DIAGNOSIS — Z471 Aftercare following joint replacement surgery: Secondary | ICD-10-CM | POA: Diagnosis not present

## 2017-02-23 DIAGNOSIS — M6281 Muscle weakness (generalized): Secondary | ICD-10-CM | POA: Diagnosis not present

## 2017-02-23 DIAGNOSIS — R269 Unspecified abnormalities of gait and mobility: Secondary | ICD-10-CM | POA: Diagnosis not present

## 2017-02-25 DIAGNOSIS — Z471 Aftercare following joint replacement surgery: Secondary | ICD-10-CM | POA: Diagnosis not present

## 2017-02-25 DIAGNOSIS — R269 Unspecified abnormalities of gait and mobility: Secondary | ICD-10-CM | POA: Diagnosis not present

## 2017-02-25 DIAGNOSIS — M6281 Muscle weakness (generalized): Secondary | ICD-10-CM | POA: Diagnosis not present

## 2017-03-06 DIAGNOSIS — R269 Unspecified abnormalities of gait and mobility: Secondary | ICD-10-CM | POA: Diagnosis not present

## 2017-03-06 DIAGNOSIS — Z471 Aftercare following joint replacement surgery: Secondary | ICD-10-CM | POA: Diagnosis not present

## 2017-03-06 DIAGNOSIS — M6281 Muscle weakness (generalized): Secondary | ICD-10-CM | POA: Diagnosis not present

## 2017-03-09 DIAGNOSIS — R269 Unspecified abnormalities of gait and mobility: Secondary | ICD-10-CM | POA: Diagnosis not present

## 2017-03-09 DIAGNOSIS — Z471 Aftercare following joint replacement surgery: Secondary | ICD-10-CM | POA: Diagnosis not present

## 2017-03-09 DIAGNOSIS — M6281 Muscle weakness (generalized): Secondary | ICD-10-CM | POA: Diagnosis not present

## 2017-03-13 DIAGNOSIS — Z471 Aftercare following joint replacement surgery: Secondary | ICD-10-CM | POA: Diagnosis not present

## 2017-03-13 DIAGNOSIS — Z96651 Presence of right artificial knee joint: Secondary | ICD-10-CM | POA: Diagnosis not present

## 2017-03-30 ENCOUNTER — Encounter: Payer: Self-pay | Admitting: Internal Medicine

## 2017-03-30 ENCOUNTER — Ambulatory Visit (INDEPENDENT_AMBULATORY_CARE_PROVIDER_SITE_OTHER): Payer: Medicare Other | Admitting: Internal Medicine

## 2017-03-30 VITALS — BP 110/70 | HR 81 | Temp 98.0°F | Ht 63.0 in | Wt 229.0 lb

## 2017-03-30 DIAGNOSIS — R21 Rash and other nonspecific skin eruption: Secondary | ICD-10-CM | POA: Diagnosis not present

## 2017-03-30 NOTE — Progress Notes (Signed)
   Subjective:    Patient ID: [name], female    DOB: 14-Apr-1940, 77 y.o.   MRN: [id number]  HPI In June 2018 she had left shoulder replacement by Dr. Veverly Fells.  In November, she had right knee arthroplasty by Dr. Maureen Ralphs.  After discharge she went to a rehab facility in [place] for several weeks but she was at home with some help.  She reports developing a rash over the past few weeks that seems to come up spontaneously and be very itchy.  These are not hive-like lesions.    Review of Systems see above     Objective:   Physical Exam She has 3 papular lesions close together on her right anterior shoulder.  These are slightly erythematous.  She has other scattered lesions on her trunk that are mainly single but raised and very itchy.       Assessment & Plan:  ? Scabies  Plan: To see Dermatologist in am.

## 2017-03-30 NOTE — Patient Instructions (Signed)
To see Dermatologist tomorrow

## 2017-03-31 DIAGNOSIS — L308 Other specified dermatitis: Secondary | ICD-10-CM | POA: Diagnosis not present

## 2017-04-23 DIAGNOSIS — Z471 Aftercare following joint replacement surgery: Secondary | ICD-10-CM | POA: Diagnosis not present

## 2017-04-23 DIAGNOSIS — Z96651 Presence of right artificial knee joint: Secondary | ICD-10-CM | POA: Diagnosis not present

## 2017-04-23 DIAGNOSIS — M7062 Trochanteric bursitis, left hip: Secondary | ICD-10-CM | POA: Diagnosis not present

## 2017-05-01 ENCOUNTER — Other Ambulatory Visit: Payer: Self-pay | Admitting: Internal Medicine

## 2017-06-16 ENCOUNTER — Other Ambulatory Visit: Payer: Self-pay

## 2017-06-16 DIAGNOSIS — N183 Chronic kidney disease, stage 3 unspecified: Secondary | ICD-10-CM

## 2017-06-16 DIAGNOSIS — I10 Essential (primary) hypertension: Secondary | ICD-10-CM

## 2017-06-16 DIAGNOSIS — E039 Hypothyroidism, unspecified: Secondary | ICD-10-CM

## 2017-06-16 DIAGNOSIS — E119 Type 2 diabetes mellitus without complications: Secondary | ICD-10-CM

## 2017-06-30 DIAGNOSIS — H353111 Nonexudative age-related macular degeneration, right eye, early dry stage: Secondary | ICD-10-CM | POA: Diagnosis not present

## 2017-06-30 DIAGNOSIS — H43813 Vitreous degeneration, bilateral: Secondary | ICD-10-CM | POA: Diagnosis not present

## 2017-07-03 ENCOUNTER — Encounter: Payer: Self-pay | Admitting: Internal Medicine

## 2017-07-03 ENCOUNTER — Ambulatory Visit (INDEPENDENT_AMBULATORY_CARE_PROVIDER_SITE_OTHER): Payer: Medicare Other | Admitting: Internal Medicine

## 2017-07-03 VITALS — BP 130/80 | HR 64 | Temp 98.1°F | Ht 63.0 in | Wt 232.0 lb

## 2017-07-03 DIAGNOSIS — I159 Secondary hypertension, unspecified: Secondary | ICD-10-CM

## 2017-07-03 DIAGNOSIS — E8881 Metabolic syndrome: Secondary | ICD-10-CM

## 2017-07-03 DIAGNOSIS — N183 Chronic kidney disease, stage 3 unspecified: Secondary | ICD-10-CM

## 2017-07-03 DIAGNOSIS — E119 Type 2 diabetes mellitus without complications: Secondary | ICD-10-CM

## 2017-07-03 DIAGNOSIS — E785 Hyperlipidemia, unspecified: Secondary | ICD-10-CM

## 2017-07-03 DIAGNOSIS — E039 Hypothyroidism, unspecified: Secondary | ICD-10-CM | POA: Diagnosis not present

## 2017-07-03 DIAGNOSIS — I10 Essential (primary) hypertension: Secondary | ICD-10-CM | POA: Diagnosis not present

## 2017-07-03 LAB — LIPID PANEL
CHOL/HDL RATIO: 3.3 (calc) (ref <5.0)
CHOLESTEROL: 186 mg/dL (ref <200)
HDL: 57 mg/dL (ref >50)
LDL Cholesterol (Calc): 108 mg/dL (calc) — ABNORMAL HIGH
NON-HDL CHOLESTEROL (CALC): 129 mg/dL (ref <130)
Triglycerides: 118 mg/dL (ref <150)

## 2017-07-03 LAB — HEPATIC FUNCTION PANEL
AG Ratio: 1.1 (calc) (ref 1.0–2.5)
ALBUMIN MSPROF: 4.1 g/dL (ref 3.6–5.1)
ALT: 14 U/L (ref 6–29)
AST: 26 U/L (ref 10–35)
Alkaline phosphatase (APISO): 72 U/L (ref 33–130)
BILIRUBIN DIRECT: 0.1 mg/dL (ref 0.0–0.2)
BILIRUBIN TOTAL: 0.5 mg/dL (ref 0.2–1.2)
Globulin: 3.9 g/dL (calc) — ABNORMAL HIGH (ref 1.9–3.7)
Indirect Bilirubin: 0.4 mg/dL (calc) (ref 0.2–1.2)
Total Protein: 8 g/dL (ref 6.1–8.1)

## 2017-07-03 LAB — HEMOGLOBIN A1C
HEMOGLOBIN A1C: 5.7 %{Hb} — AB (ref <5.7)
Mean Plasma Glucose: 117 (calc)
eAG (mmol/L): 6.5 (calc)

## 2017-07-03 LAB — TSH: TSH: 3.08 m[IU]/L (ref 0.40–4.50)

## 2017-07-03 NOTE — Patient Instructions (Signed)
Was a pleasure to see you today.  Hemoglobin A1c is pending.  Watch portions and try to get more exercise.  Follow-up in 6 months.  Continue same medications.

## 2017-07-03 NOTE — Progress Notes (Signed)
   Subjective:    Patient ID: [name], female    DOB: [birth year], 77 y.o.   MRN: [id number]  HPI She is here today for 57-month recheck.  She has a history of hypertension, hyperlipidemia metabolic syndrome, obesity, hypothyroidism controlled type 2 diabetes mellitus.  Right hip arthroplasty 2012, history of shoulder arthroplasty and left knee arthroplasty.  She had right knee arthroplasty November 2018.  She is done well.  She has osteoarthritis in her hands and has some stiffness in right third finger.  Had mammogram October 2018 and colonoscopy October 2018.    Serum creatinine varies from 1.06 to 1.20.  This was not checked today.  Will be followed up in the fall.  Diabetes is currently diet controlled.  However she is not pleased with her weight lowering.  We talked about getting more exercise.  She does yard work and maintains her home.  In October she weighed 336 pounds.   today she weighs 232 pounds.  Her BMI is 41.10.  Says she likes to eat sweets.  We talked about portion control and limiting carbohydrates.  Labs drawn and pending.    Review of Systems see above     Objective:   Physical Exam Skin warm and dry.  Nodes none.  Neck is supple without JVD, thyromegaly, or carotid bruits.  Chest clear to auscultation.  Cardiac exam regular rate and rhythm normal S1 and S2.  Extremities without pitting edema.       Assessment & Plan:  Status post multiple joint arthroplasties-doing well  Osteoarthritis of hands-discussion about there is not really a whole lot to do for this other than anti-inflammatory medication.  History of adenomatous colon polyp removed October 2018  Essential hypertension-stable on current regimen   Obesity-discussion regarding diet and exercise  Hypothyroidism  Hyperlipidemia  Plan: Lipid panel is essentially normal.  LDL is very slightly elevated at 108.  TSH is 3.  Liver panel is essentially normal.  Globulin is slightly high at 3.9.   Hemoglobin A1c is pending.  She will return in 6 months for physical examination.  This Spring and Summer she will work hard on diet exercise portion control and weight loss efforts.  We could contemplate increasing her thyroid replacement medication dosage but we will hold off her no and encourage portion control and watching carbohydrates.w

## 2017-07-04 LAB — MICROALBUMIN / CREATININE URINE RATIO
CREATININE, URINE: 197 mg/dL (ref 20–275)
MICROALB UR: 1 mg/dL
Microalb Creat Ratio: 5 mcg/mg creat (ref <30)

## 2017-07-24 ENCOUNTER — Other Ambulatory Visit: Payer: Self-pay | Admitting: Internal Medicine

## 2017-10-24 ENCOUNTER — Other Ambulatory Visit: Payer: Self-pay | Admitting: Internal Medicine

## 2017-11-17 DIAGNOSIS — Z961 Presence of intraocular lens: Secondary | ICD-10-CM | POA: Diagnosis not present

## 2017-11-17 DIAGNOSIS — H353111 Nonexudative age-related macular degeneration, right eye, early dry stage: Secondary | ICD-10-CM | POA: Diagnosis not present

## 2017-11-17 DIAGNOSIS — H527 Unspecified disorder of refraction: Secondary | ICD-10-CM | POA: Diagnosis not present

## 2017-12-07 ENCOUNTER — Other Ambulatory Visit: Payer: Self-pay | Admitting: Internal Medicine

## 2017-12-07 DIAGNOSIS — Z1231 Encounter for screening mammogram for malignant neoplasm of breast: Secondary | ICD-10-CM

## 2017-12-10 DIAGNOSIS — H353111 Nonexudative age-related macular degeneration, right eye, early dry stage: Secondary | ICD-10-CM | POA: Diagnosis not present

## 2018-01-04 ENCOUNTER — Ambulatory Visit
Admission: RE | Admit: 2018-01-04 | Discharge: 2018-01-04 | Disposition: A | Discharge status: Home / Self Care | Payer: Medicare Other | Source: Ambulatory Visit | Attending: Internal Medicine | Admitting: Internal Medicine

## 2018-01-04 ENCOUNTER — Encounter: Payer: Self-pay | Admitting: Internal Medicine

## 2018-01-04 ENCOUNTER — Ambulatory Visit (INDEPENDENT_AMBULATORY_CARE_PROVIDER_SITE_OTHER): Payer: Medicare Other | Admitting: Internal Medicine

## 2018-01-04 VITALS — BP 120/80 | HR 62 | Ht 63.0 in | Wt 228.0 lb

## 2018-01-04 DIAGNOSIS — G8929 Other chronic pain: Secondary | ICD-10-CM

## 2018-01-04 DIAGNOSIS — E119 Type 2 diabetes mellitus without complications: Secondary | ICD-10-CM

## 2018-01-04 DIAGNOSIS — Z96612 Presence of left artificial shoulder joint: Secondary | ICD-10-CM

## 2018-01-04 DIAGNOSIS — Z1231 Encounter for screening mammogram for malignant neoplasm of breast: Secondary | ICD-10-CM | POA: Diagnosis not present

## 2018-01-04 DIAGNOSIS — E039 Hypothyroidism, unspecified: Secondary | ICD-10-CM | POA: Diagnosis not present

## 2018-01-04 DIAGNOSIS — I159 Secondary hypertension, unspecified: Secondary | ICD-10-CM

## 2018-01-04 DIAGNOSIS — D126 Benign neoplasm of colon, unspecified: Secondary | ICD-10-CM | POA: Diagnosis not present

## 2018-01-04 DIAGNOSIS — Z79899 Other long term (current) drug therapy: Secondary | ICD-10-CM | POA: Diagnosis not present

## 2018-01-04 DIAGNOSIS — Z23 Encounter for immunization: Secondary | ICD-10-CM

## 2018-01-04 DIAGNOSIS — M545 Low back pain, unspecified: Secondary | ICD-10-CM

## 2018-01-04 DIAGNOSIS — E8881 Metabolic syndrome: Secondary | ICD-10-CM | POA: Diagnosis not present

## 2018-01-04 DIAGNOSIS — Z0000 Encounter for general adult medical examination without abnormal findings: Secondary | ICD-10-CM | POA: Diagnosis not present

## 2018-01-04 DIAGNOSIS — Z96651 Presence of right artificial knee joint: Secondary | ICD-10-CM | POA: Insufficient documentation

## 2018-01-04 DIAGNOSIS — N183 Chronic kidney disease, stage 3 unspecified: Secondary | ICD-10-CM

## 2018-01-04 DIAGNOSIS — Z96641 Presence of right artificial hip joint: Secondary | ICD-10-CM | POA: Insufficient documentation

## 2018-01-04 DIAGNOSIS — Z96652 Presence of left artificial knee joint: Secondary | ICD-10-CM | POA: Insufficient documentation

## 2018-01-04 DIAGNOSIS — E785 Hyperlipidemia, unspecified: Secondary | ICD-10-CM | POA: Diagnosis not present

## 2018-01-04 LAB — POCT URINALYSIS DIPSTICK
Bilirubin, UA: NEGATIVE
Blood, UA: NEGATIVE
GLUCOSE UA: NEGATIVE
Ketones, UA: NEGATIVE
LEUKOCYTES UA: NEGATIVE
Nitrite, UA: NEGATIVE
Protein, UA: NEGATIVE
Spec Grav, UA: 1.015 (ref 1.010–1.025)
Urobilinogen, UA: 0.2 E.U./dL
pH, UA: 7 (ref 5.0–8.0)

## 2018-01-04 MED ORDER — HYDROCODONE-ACETAMINOPHEN 10-325 MG PO TABS: 1.0000 | ORAL_TABLET | Freq: Four times a day (QID) | ORAL | 0 refills | Status: DC | PRN

## 2018-01-04 NOTE — Patient Instructions (Signed)
It was a pleasure to see you today.  Have mammogram today.  Labs drawn and pending and results to follow.  Flu vaccine given.  Follow-up in 6 months.

## 2018-01-04 NOTE — Progress Notes (Signed)
Subjective:    Patient ID: [name], female    DOB: 1941/02/06, 77 y.o.   MRN: [id number]  HPI 77 year old Female for Medicare wellness, health maintenance exam and evaluation of medical issues.No new complaints. Not depressed and no falls. Had eye exam in [facility] by Dr. Criss Rosales. May have some macular degeneration right eye. Able to see to drive OK.  Status post left knee arthroplasty 2017.  Right knee arthroplasty November 2018.  Left shoulder arthroplasty by Dr. Veverly Fells 12/27/2016.  Right hip arthroplasty 2010.  Colonoscopy by Dr. Hilarie Fredrickson October 2018 with 4 mm polyp removed from cecum which was a tubular adenoma and 5-year follow-up recommended.  He is to have mammogram today.  Flu vaccine given.  History of intertrigo, essential hypertension, hyperlipidemia, obesity, metabolic syndrome, controlled type 2 diabetes mellitus, hypothyroidism.  Diabetes is diet-controlled at the present time.  No history of serious illnesses accidents or operations before I assumed her care in 2009.  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  Resides alone.  Does not smoke or consume alcohol.  She owns her own home, does her own housework.  She completed high school.  She is married.  Husband is in a nursing home here in [place] with history of diabetes status post leg amputation.  Her daughter is a Marine scientist with low back our GI resides here as well.  Patient has chronic kidney disease which is being observed.  Family history: Father died at age 1 of prostate cancer.  Mother died at age 31 of a stroke with history of hypertension.  One brother with history of lung cancer.       Mammogram due. Flu vaccine given today. Rx given for shingles vaccine.    Review of Systems  Constitutional: Negative.   All other systems reviewed and are negative.      Objective:   Physical Exam  Constitutional: She is oriented to  person, place, and time. She appears well-developed and well-nourished. No distress.  HENT:  Head: Normocephalic and atraumatic.  Right Ear: External ear normal.  Left Ear: External ear normal.  Mouth/Throat: Oropharynx is clear and moist. No oropharyngeal exudate.  Eyes: Pupils are equal, round, and reactive to light. Conjunctivae are normal. Right eye exhibits no discharge. Left eye exhibits no discharge. No scleral icterus.  Neck: Neck supple. No JVD present. No thyromegaly present.  Cardiovascular: Normal rate, regular rhythm, normal heart sounds and intact distal pulses.  No murmur heard. Pulmonary/Chest: Effort normal and breath sounds normal. No stridor. No respiratory distress. She has no wheezes. She has no rales.  Abdominal: Soft. Bowel sounds are normal. She exhibits no distension and no mass. There is no tenderness. There is no rebound and no guarding.  Genitourinary:  Genitourinary Comments: Pelvic not done  Musculoskeletal: She exhibits no edema.  Lymphadenopathy:    She has no cervical adenopathy.  Neurological: She is oriented to person, place, and time. She displays normal reflexes. No cranial nerve deficit or sensory deficit. She exhibits normal muscle tone. Coordination normal.  Skin: Skin is warm and dry. No rash noted. She is not diaphoretic. No erythema.  Psychiatric: She has a normal mood and affect. Her behavior is normal. Judgment and thought content normal.  Vitals reviewed.         Assessment & Plan:  Impaired glucose tolerance which is diet-controlled  Essential hypertension-stable on current regimen  Hyperlipidemia-on statin medication  Hypothyroidism-TSH drawn and pending  Metabolic syndrome  Obesity- watch diet  Chronic kidney disease-stage III and being watched likely due to hypertension and impaired glucose tolerance  Status post bilateral cataract extractions  Macular degeneration right eye  History of shoulder arthroplasty  History of  bilateral knee arthroplasties  Right hip arthroplasty 2018  Musculoskeletal pain-given small quantity of hydrocodone APAP 10/325( #20) to take sparingly  Plan: Given small quantity of Hydrocodone APAP 10/325 for musculoskeletal pain.  Return in 6 months.  Mammogram arranged for today.  Subjective:   Patient presents for Medicare Annual/Subsequent preventive examination.  Review Past Medical/Family/Social: See above  Risk Factors  Current exercise habits: House and yard work Dietary issues discussed: Low-fat low carbohydrate  Cardiac risk factors: Hyperlipidemia, hypertension, metabolic syndrome, impaired glucose tolerance family history of stroke in mother  Depression Screen  (Note: if answer to either of the following is "Yes", a more complete depression screening is indicated)   Over the past two weeks, have you felt down, depressed or hopeless? No  Over the past two weeks, have you felt little interest or pleasure in doing things? No Have you lost interest or pleasure in daily life? No Do you often feel hopeless? No Do you cry easily over simple problems? No   Activities of Daily Living  In your present state of health, do you have any difficulty performing the following activities?:   Driving? No  Managing money? No  Feeding yourself? No  Getting from bed to chair? No  Climbing a flight of stairs? No  Preparing food and eating?: No  Bathing or showering? No  Getting dressed: No  Getting to the toilet? No  Using the toilet:No  Moving around from place to place: No  In the past year have you fallen or had a near fall?:No  Are you sexually active? No  Do you have more than one partner? No   Hearing Difficulties: No  Do you often ask people to speak up or repeat themselves? No  Do you experience ringing or noises in your ears? yes Do you have difficulty understanding soft or whispered voices? No  Do you feel that you have a problem with memory? No Do you often  misplace items? No    Home Safety:  Do you have a smoke alarm at your residence? Yes Do you have grab bars in the bathroom?  No Do you have throw rugs in your house?  Yes   Cognitive Testing  Alert? Yes Normal Appearance?Yes  Oriented to person? Yes Place? Yes  Time? Yes  Recall of three objects? Yes  Can perform simple calculations? Yes  Displays appropriate judgment?Yes  Can read the correct time from a watch face?Yes   List the Names of Other Physician/Practitioners you currently use:  See referral list for the physicians patient is currently seeing.     Review of Systems: See above by Suanne Marker welcome   Objective:     General appearance: Appears stated age and obese  Head: Normocephalic, without obvious abnormality, atraumatic  Eyes: conj clear, EOMi PEERLA  Ears: normal TM's and external ear canals both ears  Nose: Nares normal. Septum midline. Mucosa normal. No drainage or sinus tenderness.  Throat: lips, mucosa, and tongue normal; teeth and gums normal  Neck: no adenopathy, no carotid bruit, no JVD, supple, symmetrical, trachea midline and thyroid not enlarged, symmetric, no tenderness/mass/nodules  No CVA tenderness.  Lungs: clear to auscultation bilaterally  Breasts: normal appearance, no masses or tenderness Heart:  regular rate and rhythm, S1, S2 normal, no murmur, click, rub or gallop  Abdomen: soft, non-tender; bowel sounds normal; no masses, no organomegaly  Musculoskeletal: ROM normal in all joints, no crepitus, no deformity, Normal muscle strengthen. Back  is symmetric, no curvature. Skin: Skin color, texture, turgor normal. No rashes or lesions  Lymph nodes: Cervical, supraclavicular, and axillary nodes normal.  Neurologic: CN 2 -12 Normal, Normal symmetric reflexes. Normal coordination and gait  Psych: Alert & Oriented x 3, Mood appear stable.    Assessment:    Annual wellness medicare exam   Plan:    During the course of the visit the patient was  educated and counseled about appropriate screening and preventive services including:   Annual mammogram     Patient Instructions (the written plan) was given to the patient.  Medicare Attestation  I have personally reviewed:  The patient's medical and social history  Their use of alcohol, tobacco or illicit drugs  Their current medications and supplements  The patient's functional ability including ADLs,fall risks, home safety risks, cognitive, and hearing and visual impairment  Diet and physical activities  Evidence for depression or mood disorders  The patient's weight, height, BMI, and visual acuity have been recorded in the chart. I have made referrals, counseling, and provided education to the patient based on review of the above and I have provided the patient with a written personalized care plan for preventive services.

## 2018-01-05 LAB — CBC WITH DIFFERENTIAL/PLATELET
BASOS ABS: 31 {cells}/uL (ref 0–200)
Basophils Relative: 0.9 %
EOS ABS: 68 {cells}/uL (ref 15–500)
Eosinophils Relative: 2 %
HCT: 36.7 % (ref 35.0–45.0)
HEMOGLOBIN: 12.6 g/dL (ref 11.7–15.5)
Lymphs Abs: 1272 cells/uL (ref 850–3900)
MCH: 31.8 pg (ref 27.0–33.0)
MCHC: 34.3 g/dL (ref 32.0–36.0)
MCV: 92.7 fL (ref 80.0–100.0)
MONOS PCT: 13.2 %
MPV: 10.5 fL (ref 7.5–12.5)
NEUTROS ABS: 1581 {cells}/uL (ref 1500–7800)
NEUTROS PCT: 46.5 %
Platelets: 233 10*3/uL (ref 140–400)
RBC: 3.96 10*6/uL (ref 3.80–5.10)
RDW: 12.7 % (ref 11.0–15.0)
Total Lymphocyte: 37.4 %
WBC mixed population: 449 cells/uL (ref 200–950)
WBC: 3.4 10*3/uL — ABNORMAL LOW (ref 3.8–10.8)

## 2018-01-05 LAB — COMPLETE METABOLIC PANEL WITH GFR
AG Ratio: 1.1 (calc) (ref 1.0–2.5)
ALBUMIN MSPROF: 4 g/dL (ref 3.6–5.1)
ALT: 13 U/L (ref 6–29)
AST: 24 U/L (ref 10–35)
Alkaline phosphatase (APISO): 59 U/L (ref 33–130)
BUN / CREAT RATIO: 21 (calc) (ref 6–22)
BUN: 26 mg/dL — AB (ref 7–25)
CALCIUM: 9.5 mg/dL (ref 8.6–10.4)
CO2: 27 mmol/L (ref 20–32)
CREATININE: 1.23 mg/dL — AB (ref 0.60–0.93)
Chloride: 106 mmol/L (ref 98–110)
GFR, EST NON AFRICAN AMERICAN: 42 mL/min/{1.73_m2} — AB (ref >=60)
GFR, Est African American: 49 mL/min/{1.73_m2} — ABNORMAL LOW (ref >=60)
GLUCOSE: 83 mg/dL (ref 65–99)
Globulin: 3.6 g/dL (calc) (ref 1.9–3.7)
Potassium: 4 mmol/L (ref 3.5–5.3)
Sodium: 141 mmol/L (ref 135–146)
Total Bilirubin: 0.5 mg/dL (ref 0.2–1.2)
Total Protein: 7.6 g/dL (ref 6.1–8.1)

## 2018-01-05 LAB — HEMOGLOBIN A1C
HEMOGLOBIN A1C: 5.8 %{Hb} — AB (ref <5.7)
MEAN PLASMA GLUCOSE: 120 (calc)
eAG (mmol/L): 6.6 (calc)

## 2018-01-05 LAB — LIPID PANEL
CHOLESTEROL: 174 mg/dL (ref <200)
HDL: 51 mg/dL (ref >50)
LDL Cholesterol (Calc): 104 mg/dL (calc) — ABNORMAL HIGH
Non-HDL Cholesterol (Calc): 123 mg/dL (calc) (ref <130)
Total CHOL/HDL Ratio: 3.4 (calc) (ref <5.0)
Triglycerides: 91 mg/dL (ref <150)

## 2018-01-05 LAB — TSH: TSH: 0.92 mIU/L (ref 0.40–4.50)

## 2018-02-02 ENCOUNTER — Other Ambulatory Visit: Payer: Self-pay | Admitting: Internal Medicine

## 2018-05-10 ENCOUNTER — Other Ambulatory Visit: Payer: Self-pay | Admitting: Internal Medicine

## 2018-05-18 ENCOUNTER — Telehealth: Payer: Self-pay | Admitting: Internal Medicine

## 2018-05-18 MED ORDER — AMLODIPINE BESYLATE 5 MG PO TABS: ORAL_TABLET | ORAL | 3 refills | Status: DC

## 2018-05-18 MED ORDER — METOPROLOL SUCCINATE ER 25 MG PO TB24: ORAL_TABLET | ORAL | 3 refills | Status: DC

## 2018-05-18 NOTE — Telephone Encounter (Signed)
Refill these x one year

## 2018-05-18 NOTE — Telephone Encounter (Signed)
Done

## 2018-05-18 NOTE — Telephone Encounter (Signed)
Ardena Gangl [phone]  [facility]  amLODipine (NORVASC) 5 MG tablet metoprolol succinate (TOPROL-XL) 25 MG 24 hr tablet   Stanton Kidney called to say she needs refill on above medications before her next office visit, that the pharmacy had told her to call.

## 2018-05-19 DIAGNOSIS — H353111 Nonexudative age-related macular degeneration, right eye, early dry stage: Secondary | ICD-10-CM | POA: Diagnosis not present

## 2018-07-05 ENCOUNTER — Ambulatory Visit: Payer: Medicare Other | Admitting: Internal Medicine

## 2018-07-13 ENCOUNTER — Telehealth: Payer: Self-pay | Admitting: Internal Medicine

## 2018-07-13 NOTE — Telephone Encounter (Signed)
LVM to CB and reschedule 6 month recheck and schedule CPE and labs in late October

## 2018-09-30 ENCOUNTER — Ambulatory Visit (INDEPENDENT_AMBULATORY_CARE_PROVIDER_SITE_OTHER): Payer: Medicare Other | Admitting: Internal Medicine

## 2018-09-30 ENCOUNTER — Encounter: Payer: Self-pay | Admitting: Internal Medicine

## 2018-09-30 VITALS — BP 110/80 | HR 62 | Temp 97.8°F | Ht 63.0 in | Wt 228.0 lb

## 2018-09-30 DIAGNOSIS — E119 Type 2 diabetes mellitus without complications: Secondary | ICD-10-CM | POA: Diagnosis not present

## 2018-09-30 DIAGNOSIS — E785 Hyperlipidemia, unspecified: Secondary | ICD-10-CM | POA: Diagnosis not present

## 2018-09-30 MED ORDER — SIMVASTATIN 10 MG PO TABS: ORAL_TABLET | ORAL | 3 refills | Status: DC

## 2018-09-30 NOTE — Progress Notes (Signed)
   Subjective:    Patient ID: [name], female    DOB: 1940-07-30, 78 y.o.   MRN: [id number]  HPI 78 year old Female  for 6 month follow up.  Colonoscopy by Dr. Hilarie Fredrickson October 2018 with 4 mm tubular adenoma removed and five-year follow-up recommended.  Has musculoskeletal pain status post right knee arthroplasty, left shoulder arthroplasty and right hip arthroplasty as well as left knee arthroplasty.  History of intertrigo, essential hypertension, hyperlipidemia, obesity, metabolic syndrome, hypothyroidism and diet-controlled type 2 diabetes mellitus.  Chronic kidney disease which is being observed.    Review of Systems doing well during the pandemic with no new complaints.  Continues to reside alone.  No falls.     Objective:   Physical Exam Blood pressure 110/80.  BMI 40.39.  Weight 228 pounds.  Pulse 62 regular.  Pulse oximetry 97%.  Skin warm and dry.  Nodes none.  No thyromegaly.  Chest clear to auscultation without rales or wheezing.  Cardiac exam regular rate and rhythm normal S1 and S2 without murmurs or gallops.  No lower extremity edema.       Assessment & Plan:  Impaired glucose tolerance-hemoglobin A1c 5.7% and stable.  Continue diet and exercise.  Hyperlipidemia-LDL was 115 and previously was 1 11 December 2017.  HDL was low at 48.  Continue diet and exercise efforts  BMI 40.39-continue to work on diet and exercise  Essential hypertension-stable on current regimen  Plan: She is getting along well living by herself during the pandemic.  Family is supportive.  Return in 6 months or as needed.  Continue current medications.  Recommend annual flu vaccine early Fall.  Okay to refill current dose of levothyroxine 0.075 mg daily, furosemide 40 mg daily amlodipine 5 mg daily, metoprolol 25 mg daily, potassium 10 mEq tablets daily and simvastatin 10 mg daily.

## 2018-10-01 LAB — MICROALBUMIN / CREATININE URINE RATIO
Creatinine, Urine: 266 mg/dL (ref 20–275)
Microalb Creat Ratio: 13 mcg/mg creat (ref <30)
Microalb, Ur: 3.4 mg/dL

## 2018-10-01 LAB — HEPATIC FUNCTION PANEL
AG Ratio: 0.9 (calc) — ABNORMAL LOW (ref 1.0–2.5)
ALT: 9 U/L (ref 6–29)
AST: 21 U/L (ref 10–35)
Albumin: 3.9 g/dL (ref 3.6–5.1)
Alkaline phosphatase (APISO): 57 U/L (ref 37–153)
Bilirubin, Direct: 0.1 mg/dL (ref 0.0–0.2)
Globulin: 4.2 g/dL (calc) — ABNORMAL HIGH (ref 1.9–3.7)
Indirect Bilirubin: 0.6 mg/dL (calc) (ref 0.2–1.2)
Total Bilirubin: 0.7 mg/dL (ref 0.2–1.2)
Total Protein: 8.1 g/dL (ref 6.1–8.1)

## 2018-10-01 LAB — LIPID PANEL
Cholesterol: 182 mg/dL (ref <200)
HDL: 48 mg/dL — ABNORMAL LOW (ref >=50)
LDL Cholesterol (Calc): 115 mg/dL (calc) — ABNORMAL HIGH
Non-HDL Cholesterol (Calc): 134 mg/dL (calc) — ABNORMAL HIGH (ref <130)
Total CHOL/HDL Ratio: 3.8 (calc) (ref <5.0)
Triglycerides: 86 mg/dL (ref <150)

## 2018-10-01 LAB — HEMOGLOBIN A1C
Hgb A1c MFr Bld: 5.7 % of total Hgb — ABNORMAL HIGH (ref <5.7)
Mean Plasma Glucose: 117 (calc)
eAG (mmol/L): 6.5 (calc)

## 2018-11-06 NOTE — Patient Instructions (Signed)
It was a pleasure to see you today.  Continue current medications.  Have flu vaccine early Fall.  Work on diet exercise and weight loss.  Return in 6 months

## 2018-11-12 IMAGING — MG DIGITAL SCREENING BILATERAL MAMMOGRAM WITH TOMO AND CAD
8 series · 8 of 24 positions shown · non-contrast
Comparison: Previous exam(s).

CLINICAL DATA: Screening.

EXAM:
DIGITAL SCREENING BILATERAL MAMMOGRAM WITH TOMO AND CAD

[L MLO synth-2D]
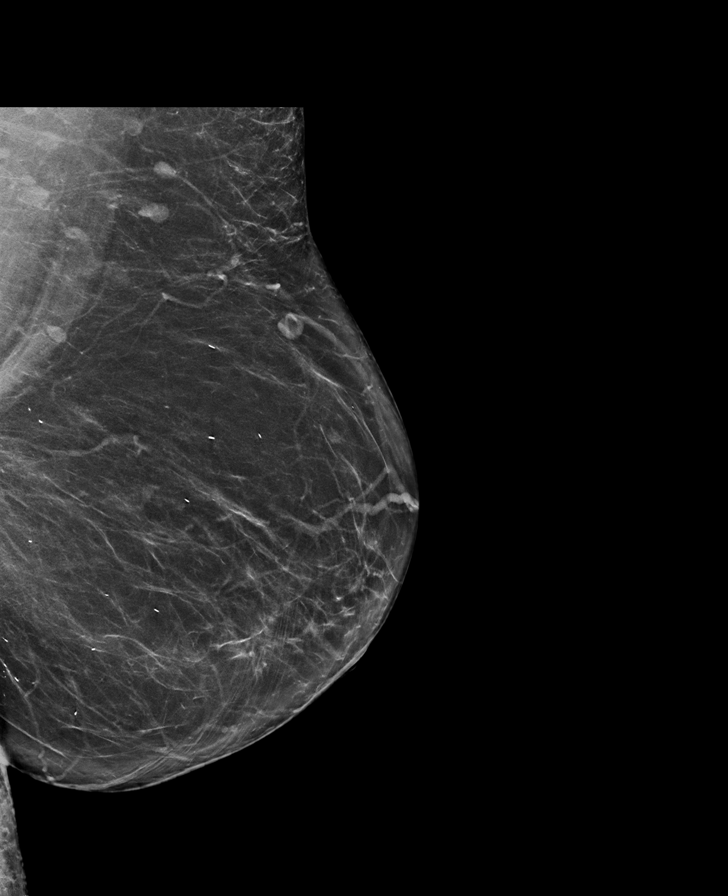

[L CC synth-2D]
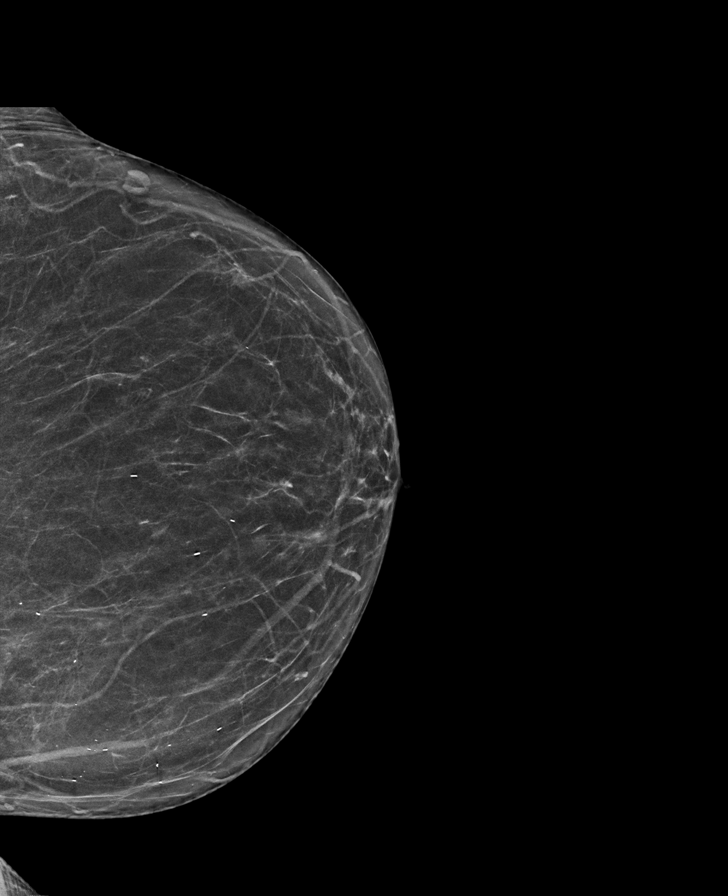

[R CC synth-2D]
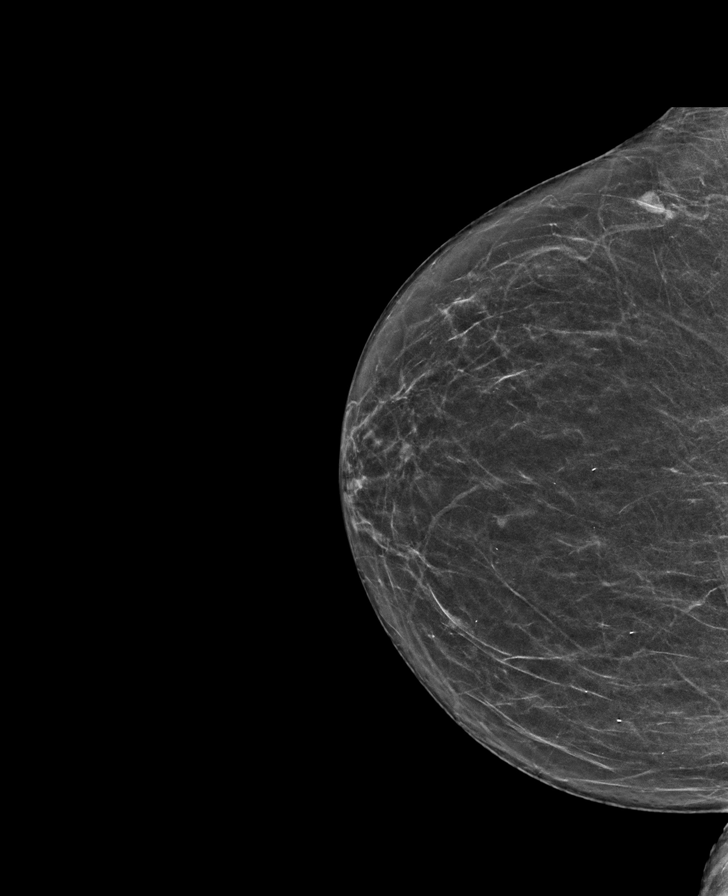

[R MLO synth-2D]
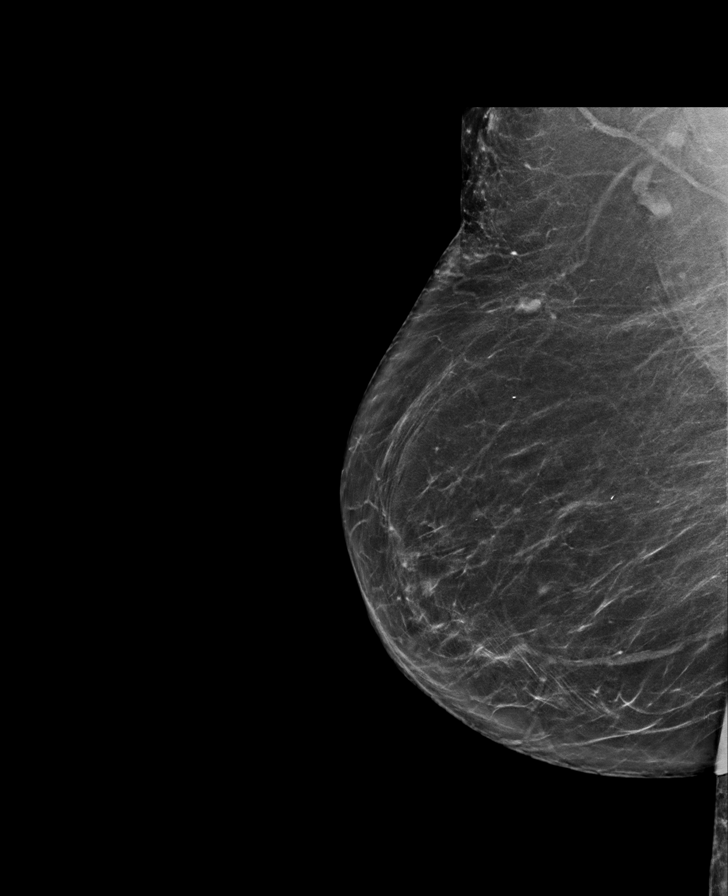

[L CC tomo · tomo slice 31/62.0]
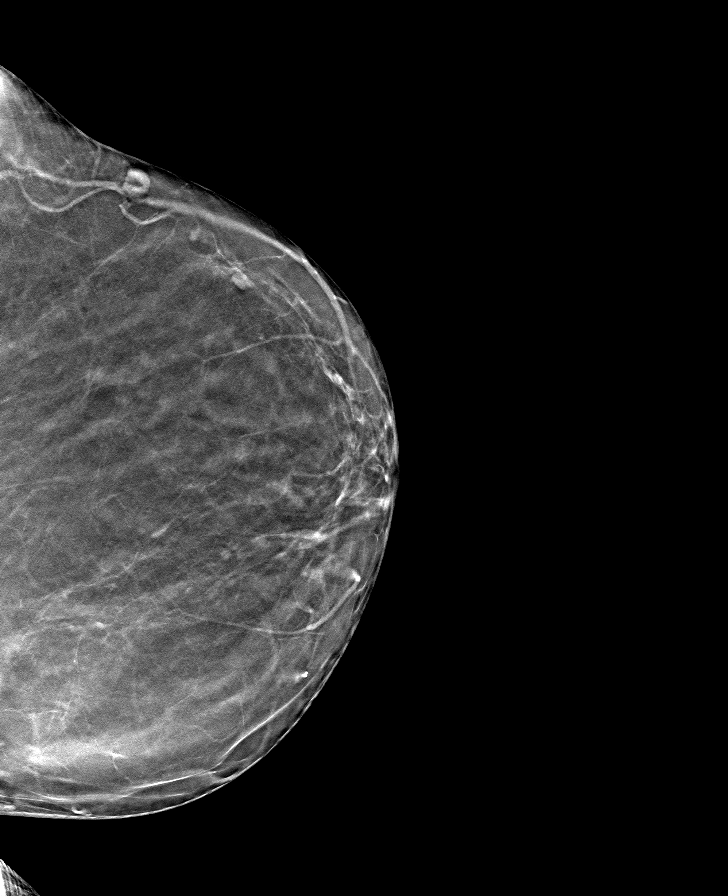

[R MLO tomo · tomo slice 40/79.0]
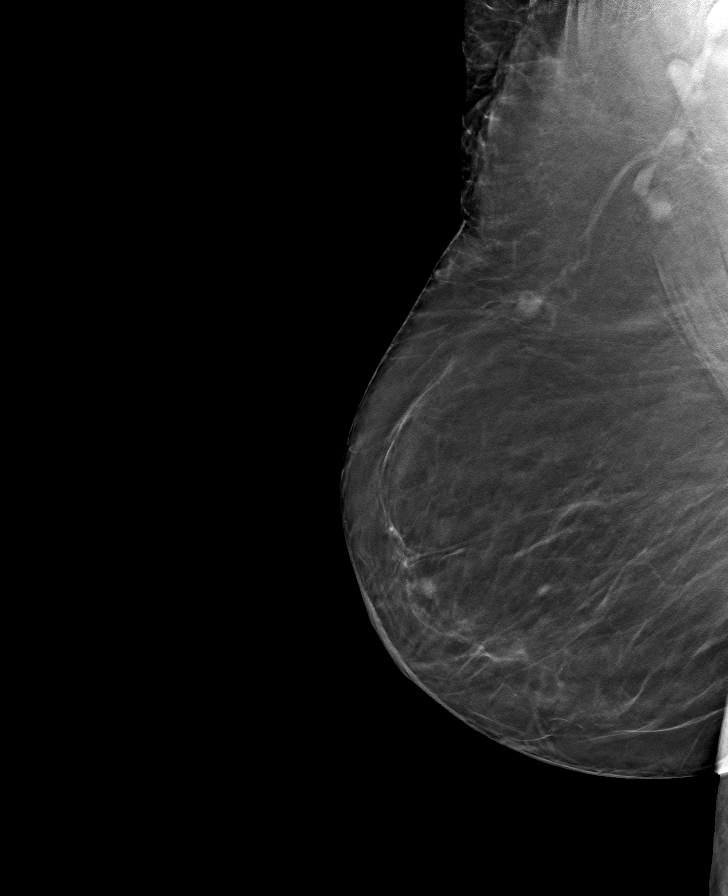

[L MLO tomo · tomo slice 39/77.0]
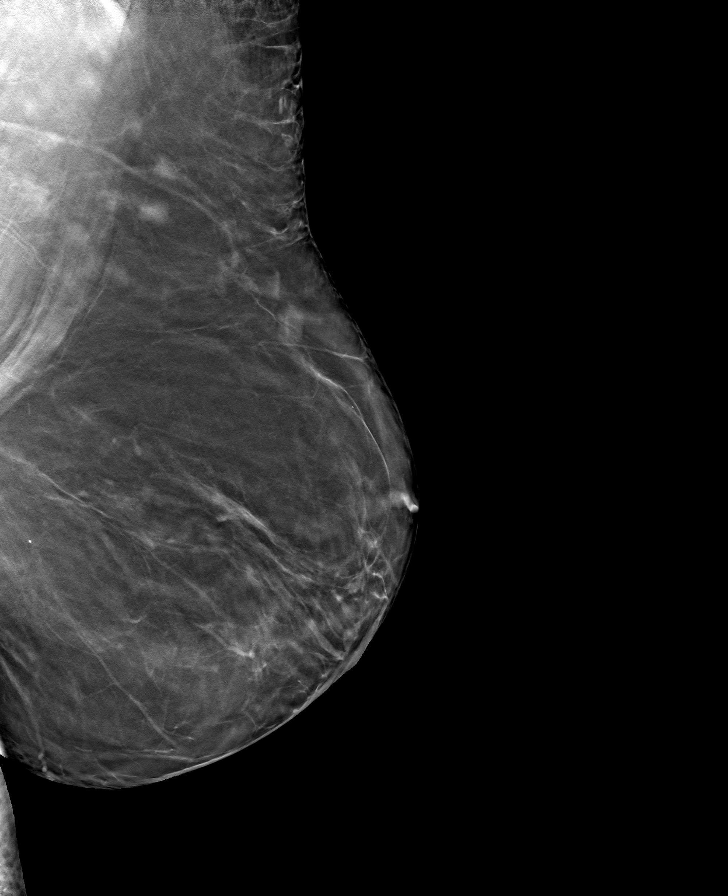

[R CC tomo · tomo slice 31/61.0]
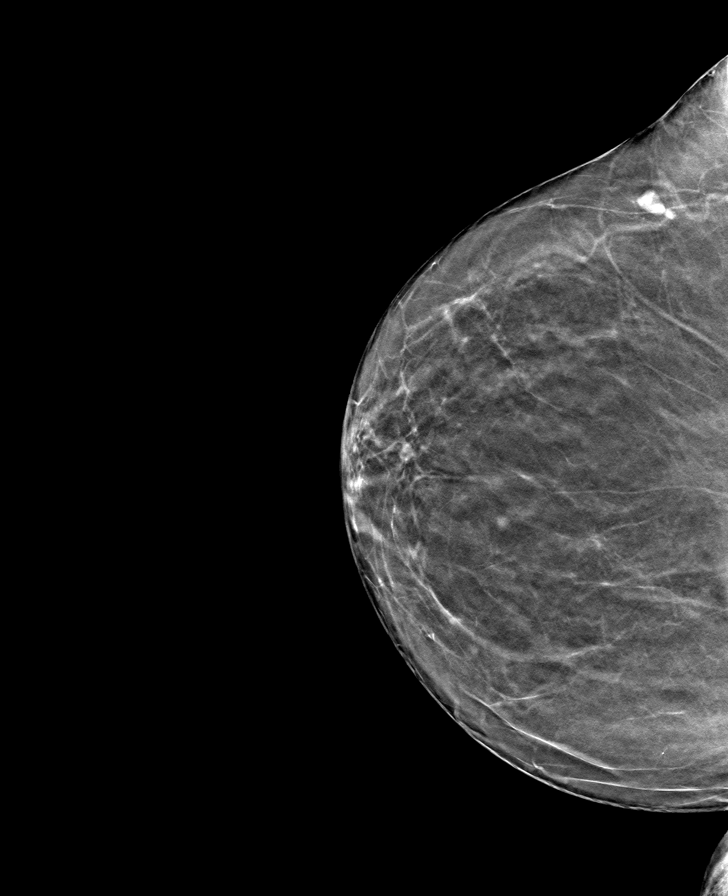

[8 of 24 positions shown; findings below may reference images not displayed]

ACR Breast Density Category b: There are scattered areas of
fibroglandular density.
FINDINGS: There are no findings suspicious for malignancy. Images were
processed with CAD.
IMPRESSION: No mammographic evidence of malignancy. A result letter of this
screening mammogram will be mailed directly to the patient.

RECOMMENDATION:
Screening mammogram in one year. (Code:CN-U-775)

BI-RADS CATEGORY  1: Negative.

## 2018-11-23 DIAGNOSIS — H5203 Hypermetropia, bilateral: Secondary | ICD-10-CM | POA: Diagnosis not present

## 2018-11-23 DIAGNOSIS — H52223 Regular astigmatism, bilateral: Secondary | ICD-10-CM | POA: Diagnosis not present

## 2018-11-23 DIAGNOSIS — Z961 Presence of intraocular lens: Secondary | ICD-10-CM | POA: Diagnosis not present

## 2018-11-23 DIAGNOSIS — H353111 Nonexudative age-related macular degeneration, right eye, early dry stage: Secondary | ICD-10-CM | POA: Diagnosis not present

## 2018-12-06 ENCOUNTER — Other Ambulatory Visit: Payer: Self-pay | Admitting: Internal Medicine

## 2018-12-06 DIAGNOSIS — Z1231 Encounter for screening mammogram for malignant neoplasm of breast: Secondary | ICD-10-CM

## 2019-01-21 ENCOUNTER — Ambulatory Visit: Payer: Medicare Other

## 2019-02-02 ENCOUNTER — Other Ambulatory Visit: Payer: Self-pay

## 2019-02-04 ENCOUNTER — Other Ambulatory Visit: Payer: Self-pay | Admitting: Internal Medicine

## 2019-02-17 ENCOUNTER — Ambulatory Visit (INDEPENDENT_AMBULATORY_CARE_PROVIDER_SITE_OTHER): Payer: Medicare Other | Admitting: Internal Medicine

## 2019-02-17 ENCOUNTER — Ambulatory Visit
Admission: RE | Admit: 2019-02-17 | Discharge: 2019-02-17 | Disposition: A | Discharge status: Home / Self Care | Payer: Medicare Other | Source: Ambulatory Visit | Attending: Internal Medicine | Admitting: Internal Medicine

## 2019-02-17 ENCOUNTER — Other Ambulatory Visit: Payer: Self-pay

## 2019-02-17 ENCOUNTER — Encounter: Payer: Self-pay | Admitting: Internal Medicine

## 2019-02-17 VITALS — BP 100/70 | HR 66 | Temp 98.1°F | Ht 63.0 in | Wt 228.0 lb

## 2019-02-17 DIAGNOSIS — Z9841 Cataract extraction status, right eye: Secondary | ICD-10-CM

## 2019-02-17 DIAGNOSIS — D126 Benign neoplasm of colon, unspecified: Secondary | ICD-10-CM

## 2019-02-17 DIAGNOSIS — Z96641 Presence of right artificial hip joint: Secondary | ICD-10-CM | POA: Diagnosis not present

## 2019-02-17 DIAGNOSIS — E039 Hypothyroidism, unspecified: Secondary | ICD-10-CM | POA: Diagnosis not present

## 2019-02-17 DIAGNOSIS — R779 Abnormality of plasma protein, unspecified: Secondary | ICD-10-CM

## 2019-02-17 DIAGNOSIS — E785 Hyperlipidemia, unspecified: Secondary | ICD-10-CM | POA: Diagnosis not present

## 2019-02-17 DIAGNOSIS — E782 Mixed hyperlipidemia: Secondary | ICD-10-CM | POA: Diagnosis not present

## 2019-02-17 DIAGNOSIS — E119 Type 2 diabetes mellitus without complications: Secondary | ICD-10-CM

## 2019-02-17 DIAGNOSIS — Z6841 Body Mass Index (BMI) 40.0 and over, adult: Secondary | ICD-10-CM | POA: Diagnosis not present

## 2019-02-17 DIAGNOSIS — Z96652 Presence of left artificial knee joint: Secondary | ICD-10-CM | POA: Diagnosis not present

## 2019-02-17 DIAGNOSIS — Z96651 Presence of right artificial knee joint: Secondary | ICD-10-CM

## 2019-02-17 DIAGNOSIS — E8881 Metabolic syndrome: Secondary | ICD-10-CM

## 2019-02-17 DIAGNOSIS — N1831 Chronic kidney disease, stage 3a: Secondary | ICD-10-CM

## 2019-02-17 DIAGNOSIS — Z0000 Encounter for general adult medical examination without abnormal findings: Secondary | ICD-10-CM

## 2019-02-17 DIAGNOSIS — Z9842 Cataract extraction status, left eye: Secondary | ICD-10-CM

## 2019-02-17 DIAGNOSIS — I159 Secondary hypertension, unspecified: Secondary | ICD-10-CM | POA: Diagnosis not present

## 2019-02-17 DIAGNOSIS — R7302 Impaired glucose tolerance (oral): Secondary | ICD-10-CM

## 2019-02-17 DIAGNOSIS — Z96612 Presence of left artificial shoulder joint: Secondary | ICD-10-CM | POA: Diagnosis not present

## 2019-02-17 DIAGNOSIS — Z1231 Encounter for screening mammogram for malignant neoplasm of breast: Secondary | ICD-10-CM | POA: Diagnosis not present

## 2019-02-17 LAB — POCT URINALYSIS DIPSTICK
Appearance: NEGATIVE
Bilirubin, UA: NEGATIVE
Blood, UA: NEGATIVE
Glucose, UA: NEGATIVE
Ketones, UA: NEGATIVE
Leukocytes, UA: NEGATIVE
Nitrite, UA: NEGATIVE
Odor: NEGATIVE
Protein, UA: NEGATIVE
Spec Grav, UA: 1.01 (ref 1.010–1.025)
Urobilinogen, UA: 0.2 E.U./dL
pH, UA: 6.5 (ref 5.0–8.0)

## 2019-02-17 NOTE — Progress Notes (Signed)
Subjective:    Patient ID: [name], female    DOB: [birth year], 78 y.o.   MRN: [id number]  HPI 78 year old Female in today for Medicare wellness, health maintenance exam and evaluation of medical issues.  No new complaints.  Has not had any falls.  Is not depressed.  Has annual eye exam in [facility] by Dr. Jenne Campus.  Continues to drive without difficulty.  Status post left knee arthroplasty 2017.  Right knee arthroplasty November 2018.  Left shoulder arthroplasty by Dr. Veverly Fells October 2018.  Right hip arthroplasty 2018.  Colonoscopy by Dr. Hilarie Fredrickson October 2018 with 4 mm polyp removed from descending colon which was a tubular adenoma with 5-year follow-up recommended.  History of intertrigo, essential hypertension, hyperlipidemia, obesity, metabolic syndrome, controlled type 2 diabetes mellitus and hypothyroidism.  Diabetes is diet-controlled at present time.  No history of serious illnesses accidents or operations before our center.  2009.  She has chronic kidney disease which is being observed.  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  Resides alone.  Does not smoke or consume alcohol.  She has home time, does her own housework.  She completed high school.  Her daughter is a Marine scientist who works for [employer] here in [place].  Has grandchildren and great-grandchildren.  Family history: Father died at age 73 of prostate cancer.  Mother died at age 66 of a stroke with history of hypertension.  1 brother with history of lung cancer.    Review of Systems  Constitutional: Negative.   Respiratory: Negative.   Cardiovascular: Negative.   Gastrointestinal: Negative.   Genitourinary: Negative.   Psychiatric/Behavioral: Negative.        Objective:   Physical Exam Vitals reviewed.  Constitutional:      Appearance: Normal appearance.  HENT:     Head: Normocephalic and atraumatic.     Right  Ear: Tympanic membrane normal.     Left Ear: Tympanic membrane normal.     Mouth/Throat:     Mouth: Mucous membranes are moist.     Pharynx: Oropharynx is clear.  Eyes:     General: No scleral icterus.    Extraocular Movements: Extraocular movements intact.     Conjunctiva/sclera: Conjunctivae normal.     Pupils: Pupils are equal, round, and reactive to light.  Neck:     Comments: Breasts without masses.  No thyromegaly.  No adenopathy. Cardiovascular:     Rate and Rhythm: Normal rate and regular rhythm.     Heart sounds: Normal heart sounds. No murmur.     Comments: No carotid bruit Pulmonary:     Effort: Pulmonary effort is normal.     Breath sounds: Normal breath sounds.  Abdominal:     General: Bowel sounds are normal. There is no distension.     Palpations: Abdomen is soft. There is no mass.     Tenderness: There is no abdominal tenderness. There is no rebound.  Genitourinary:    Comments: Deferred Musculoskeletal:     Cervical back: Neck supple. No rigidity.  Lymphadenopathy:     Cervical: No cervical adenopathy.  Skin:    General: Skin is warm and dry.  Neurological:     General: No focal deficit present.     Mental Status: She is alert and oriented to person, place, and time.     Cranial Nerves: No cranial nerve deficit.     Motor: No weakness.  Coordination: Coordination normal.     Gait: Gait normal.     Deep Tendon Reflexes: Reflexes normal.  Psychiatric:        Mood and Affect: Mood normal.        Behavior: Behavior normal.        Thought Content: Thought content normal.        Judgment: Judgment normal.           Assessment & Plan:  Impaired glucose tolerance - hemoglobin A1c 5.6% and stable on diet alone  Essential hypertension-stable on current regimen of Lasix 40 mg daily, metoprolol 25 mg daily and amlodipine 5 mg daily  Hyperlipidemia treated with statin medication based on: 10 mg nightly  Hypothyroidism-stable on current dose of thyroid  replacement 75 mcg daily  Metabolic syndrome  Right knee arthroplasty 2018  Chronic kidney disease stage III-being watched likely due to hypertension and impaired glucose tolerance.  Creatinine is 1.32 and needs to be monitored.  Status post bilateral cataract extractions  History of macular degeneration of right eye  Reverse left shoulder arthroplasty June 2018  Right hip arthroplasty in 2018  Musculoskeletal pain.  Treated from time to time with small quantity of hydrocodone APAP  Elevated serum protein level-SPEP shows no abnormal bands  Plan: Continue on current medication regimen and follow-up in 6 months.  Had COVID-19 vaccine when available.  Subjective:   Patient presents for Medicare Annual/Subsequent preventive examination.  Review Past Medical/Family/Social: See above   Risk Factors  Current exercise habits: House and yard work Dietary issues discussed: Low-fat low carbohydrate  Cardiac risk factors: Hyperlipidemia, hypertension, metabolic syndrome, impaired glucose tolerance, family history of stroke in mother  Depression Screen  (Note: if answer to either of the following is "Yes", a more complete depression screening is indicated)   Over the past two weeks, have you felt down, depressed or hopeless? No  Over the past two weeks, have you felt little interest or pleasure in doing things? No Have you lost interest or pleasure in daily life? No Do you often feel hopeless? No Do you cry easily over simple problems? No   Activities of Daily Living  In your present state of health, do you have any difficulty performing the following activities?:   Driving? No  Managing money? No  Feeding yourself? No  Getting from bed to chair? No  Climbing a flight of stairs? No  Preparing food and eating?: No  Bathing or showering? No  Getting dressed: No  Getting to the toilet? No  Using the toilet:No  Moving around from place to place: No  In the past year have you  fallen or had a near fall?:No  Are you sexually active? No  Do you have more than one partner? No   Hearing Difficulties: No  Do you often ask people to speak up or repeat themselves? No  Do you experience ringing or noises in your ears? No  Do you have difficulty understanding soft or whispered voices? No  Do you feel that you have a problem with memory? No Do you often misplace items? No    Home Safety:  Do you have a smoke alarm at your residence? Yes Do you have grab bars in the bathroom?  None Do you have throw rugs in your house?  Yes   Cognitive Testing  Alert? Yes Normal Appearance?Yes  Oriented to person? Yes Place? Yes  Time? Yes  Recall of three objects? Yes  Can perform simple calculations? Yes  Displays appropriate judgment?Yes  Can read the correct time from a watch face?Yes   List the Names of Other Physician/Practitioners you currently use:  See referral list for the physicians patient is currently seeing.     Review of Systems: See above   Objective:     General appearance: Appears stated age and mildly obese BMI 40.39 Head: Normocephalic, without obvious abnormality, atraumatic  Eyes: conj clear, EOMi PEERLA  Ears: normal TM's and external ear canals both ears  Nose: Nares normal. Septum midline. Mucosa normal. No drainage or sinus tenderness.  Throat: lips, mucosa, and tongue normal; teeth and gums normal  Neck: no adenopathy, no carotid bruit, no JVD, supple, symmetrical, trachea midline and thyroid not enlarged, symmetric, no tenderness/mass/nodules  No CVA tenderness.  Lungs: clear to auscultation bilaterally  Breasts: normal appearance, no masses or tenderness Heart: regular rate and rhythm, S1, S2 normal, no murmur, click, rub or gallop  Abdomen: soft, non-tender; bowel sounds normal; no masses, no organomegaly  Musculoskeletal: ROM normal in all joints, no crepitus, no deformity, Normal muscle strengthen. Back  is symmetric, no curvature.  Skin: Skin color, texture, turgor normal. No rashes or lesions  Lymph nodes: Cervical, supraclavicular, and axillary nodes normal.  Neurologic: CN 2 -12 Normal, Normal symmetric reflexes. Normal coordination and gait  Psych: Alert & Oriented x 3, Mood appear stable.    Assessment:    Annual wellness medicare exam   Plan:    During the course of the visit the patient was educated and counseled about appropriate screening and preventive services including:   Annual flu vaccine  Take COVID-19 vaccine when reliable     Patient Instructions (the written plan) was given to the patient.  Medicare Attestation  I have personally reviewed:  The patient's medical and social history  Their use of alcohol, tobacco or illicit drugs  Their current medications and supplements  The patient's functional ability including ADLs,fall risks, home safety risks, cognitive, and hearing and visual impairment  Diet and physical activities  Evidence for depression or mood disorders  The patient's weight, height, BMI, and visual acuity have been recorded in the chart. I have made referrals, counseling, and provided education to the patient based on review of the above and I have provided the patient with a written personalized care plan for preventive services.

## 2019-02-18 ENCOUNTER — Ambulatory Visit: Payer: Medicare Other | Admitting: Internal Medicine

## 2019-02-18 ENCOUNTER — Other Ambulatory Visit: Payer: Self-pay

## 2019-02-18 DIAGNOSIS — R778 Other specified abnormalities of plasma proteins: Secondary | ICD-10-CM

## 2019-02-21 ENCOUNTER — Other Ambulatory Visit: Payer: Self-pay | Admitting: Internal Medicine

## 2019-02-22 LAB — COMPLETE METABOLIC PANEL WITH GFR
AG Ratio: 1 (calc) (ref 1.0–2.5)
ALT: 12 U/L (ref 6–29)
AST: 27 U/L (ref 10–35)
Albumin: 4.3 g/dL (ref 3.6–5.1)
Alkaline phosphatase (APISO): 63 U/L (ref 37–153)
BUN/Creatinine Ratio: 15 (calc) (ref 6–22)
BUN: 20 mg/dL (ref 7–25)
CO2: 28 mmol/L (ref 20–32)
Calcium: 9.9 mg/dL (ref 8.6–10.4)
Chloride: 101 mmol/L (ref 98–110)
Creat: 1.32 mg/dL — ABNORMAL HIGH (ref 0.60–0.93)
GFR, Est African American: 45 mL/min/{1.73_m2} — ABNORMAL LOW (ref >=60)
GFR, Est Non African American: 39 mL/min/{1.73_m2} — ABNORMAL LOW (ref >=60)
Globulin: 4.5 g/dL (calc) — ABNORMAL HIGH (ref 1.9–3.7)
Glucose, Bld: 90 mg/dL (ref 65–99)
Potassium: 4 mmol/L (ref 3.5–5.3)
Sodium: 139 mmol/L (ref 135–146)
Total Bilirubin: 0.6 mg/dL (ref 0.2–1.2)
Total Protein: 8.8 g/dL — ABNORMAL HIGH (ref 6.1–8.1)

## 2019-02-22 LAB — CBC WITH DIFFERENTIAL/PLATELET
Absolute Monocytes: 467 cells/uL (ref 200–950)
Basophils Absolute: 8 cells/uL (ref 0–200)
Basophils Relative: 0.2 %
Eosinophils Absolute: 49 cells/uL (ref 15–500)
Eosinophils Relative: 1.2 %
HCT: 39.2 % (ref 35.0–45.0)
Hemoglobin: 13.1 g/dL (ref 11.7–15.5)
Lymphs Abs: 1656 cells/uL (ref 850–3900)
MCH: 31.6 pg (ref 27.0–33.0)
MCHC: 33.4 g/dL (ref 32.0–36.0)
MCV: 94.5 fL (ref 80.0–100.0)
MPV: 10.8 fL (ref 7.5–12.5)
Monocytes Relative: 11.4 %
Neutro Abs: 1919 cells/uL (ref 1500–7800)
Neutrophils Relative %: 46.8 %
Platelets: 247 10*3/uL (ref 140–400)
RBC: 4.15 10*6/uL (ref 3.80–5.10)
RDW: 12.7 % (ref 11.0–15.0)
Total Lymphocyte: 40.4 %
WBC: 4.1 10*3/uL (ref 3.8–10.8)

## 2019-02-22 LAB — PROTEIN ELECTROPHORESIS, SERUM
Albumin ELP: 4.2 g/dL (ref 3.8–4.8)
Alpha 1: 0.4 g/dL — ABNORMAL HIGH (ref 0.2–0.3)
Alpha 2: 0.8 g/dL (ref 0.5–0.9)
Beta 2: 0.6 g/dL — ABNORMAL HIGH (ref 0.2–0.5)
Beta Globulin: 0.6 g/dL (ref 0.4–0.6)
Gamma Globulin: 2.3 g/dL — ABNORMAL HIGH (ref 0.8–1.7)
Total Protein: 8.8 g/dL — ABNORMAL HIGH (ref 6.1–8.1)

## 2019-02-22 LAB — LIPID PANEL
Cholesterol: 191 mg/dL (ref <200)
HDL: 62 mg/dL (ref >=50)
LDL Cholesterol (Calc): 111 mg/dL (calc) — ABNORMAL HIGH
Non-HDL Cholesterol (Calc): 129 mg/dL (calc) (ref <130)
Total CHOL/HDL Ratio: 3.1 (calc) (ref <5.0)
Triglycerides: 88 mg/dL (ref <150)

## 2019-02-22 LAB — TEST AUTHORIZATION

## 2019-02-22 LAB — HEMOGLOBIN A1C
Hgb A1c MFr Bld: 5.6 % of total Hgb (ref <5.7)
Mean Plasma Glucose: 114 (calc)
eAG (mmol/L): 6.3 (calc)

## 2019-02-22 LAB — TSH: TSH: 2 mIU/L (ref 0.40–4.50)

## 2019-04-03 NOTE — Patient Instructions (Signed)
It was a pleasure to see you today.  Continue current medications.  Try to lose some weight.  Try to walk some daily.  Take Covid vaccine when available.  Follow-up here in 6 months.

## 2019-05-27 ENCOUNTER — Other Ambulatory Visit: Payer: Self-pay | Admitting: Internal Medicine

## 2019-06-15 ENCOUNTER — Other Ambulatory Visit: Payer: Self-pay | Admitting: Internal Medicine

## 2019-08-26 ENCOUNTER — Other Ambulatory Visit: Payer: Self-pay

## 2019-08-26 ENCOUNTER — Encounter: Payer: Self-pay | Admitting: Internal Medicine

## 2019-08-26 ENCOUNTER — Ambulatory Visit (INDEPENDENT_AMBULATORY_CARE_PROVIDER_SITE_OTHER): Payer: Medicare Other | Admitting: Internal Medicine

## 2019-08-26 VITALS — BP 110/80 | HR 63 | Ht 63.0 in | Wt 233.0 lb

## 2019-08-26 DIAGNOSIS — E785 Hyperlipidemia, unspecified: Secondary | ICD-10-CM | POA: Diagnosis not present

## 2019-08-26 DIAGNOSIS — N1831 Chronic kidney disease, stage 3a: Secondary | ICD-10-CM

## 2019-08-26 DIAGNOSIS — I159 Secondary hypertension, unspecified: Secondary | ICD-10-CM

## 2019-08-26 DIAGNOSIS — Z96641 Presence of right artificial hip joint: Secondary | ICD-10-CM

## 2019-08-26 DIAGNOSIS — E8881 Metabolic syndrome: Secondary | ICD-10-CM | POA: Diagnosis not present

## 2019-08-26 DIAGNOSIS — J22 Unspecified acute lower respiratory infection: Secondary | ICD-10-CM

## 2019-08-26 DIAGNOSIS — E782 Mixed hyperlipidemia: Secondary | ICD-10-CM

## 2019-08-26 DIAGNOSIS — D126 Benign neoplasm of colon, unspecified: Secondary | ICD-10-CM

## 2019-08-26 DIAGNOSIS — G8929 Other chronic pain: Secondary | ICD-10-CM

## 2019-08-26 DIAGNOSIS — E039 Hypothyroidism, unspecified: Secondary | ICD-10-CM

## 2019-08-26 DIAGNOSIS — E1169 Type 2 diabetes mellitus with other specified complication: Secondary | ICD-10-CM

## 2019-08-26 DIAGNOSIS — Z96652 Presence of left artificial knee joint: Secondary | ICD-10-CM

## 2019-08-26 DIAGNOSIS — Z6841 Body Mass Index (BMI) 40.0 and over, adult: Secondary | ICD-10-CM | POA: Diagnosis not present

## 2019-08-26 DIAGNOSIS — E119 Type 2 diabetes mellitus without complications: Secondary | ICD-10-CM | POA: Diagnosis not present

## 2019-08-26 DIAGNOSIS — Z96651 Presence of right artificial knee joint: Secondary | ICD-10-CM

## 2019-08-26 DIAGNOSIS — M7918 Myalgia, other site: Secondary | ICD-10-CM

## 2019-08-26 DIAGNOSIS — Z96612 Presence of left artificial shoulder joint: Secondary | ICD-10-CM

## 2019-08-26 MED ORDER — HYDROCODONE-ACETAMINOPHEN 10-325 MG PO TABS: 1.0000 | ORAL_TABLET | Freq: Three times a day (TID) | ORAL | 0 refills | Status: AC | PRN

## 2019-08-26 MED ORDER — AZITHROMYCIN 250 MG PO TABS: ORAL_TABLET | ORAL | 0 refills | Status: DC

## 2019-08-26 MED ORDER — MELOXICAM 15 MG PO TABS: 15.0000 mg | ORAL_TABLET | Freq: Every day | ORAL | 5 refills | Status: DC

## 2019-08-26 NOTE — Patient Instructions (Signed)
Take Zithromax for nasal congestion. Norco 10/325 one half tab sparingly for musculoskeletal pain. Meloxicam with food daily for musculoskeletal pain. It was a pleasure to see you today. Labs drawn and pending. Follow up in 6 months.

## 2019-08-26 NOTE — Progress Notes (Signed)
   Subjective:    Patient ID: [name], female    DOB: [birth year], 79 y.o.   MRN: [id number]  HPI  79 year old Female for 9-month follow up on multiple medical issues including essential hypertension, hyperlipidemia, obesity, metabolic syndrome controlled type 2 diabetes mellitus and hypothyroidism.  Diabetes is diet-controlled at present time.  She has chronic kidney disease which is being observed.  Has annual eye exams in [place], [place] by Dr. Jenne Campus.  Continues to drive without difficulty.  Status post left knee arthroplasty 2017, right knee arthroplasty 2018, left shoulder arthroplasty 2018, right hip arthroplasty 2018.  Colonoscopy done by Dr. Donnella Sham October 2018 with a 4 mm polyp removed from descending colon which was a tubular adenoma with 5-year follow-up recommended.    Review of Systems complaining of a dry cough but no fever.  No known COVID-19 exposure.  Stays at home mostly.  Does some yard work but not a lot of other exercise     Objective:   Physical Exam Blood pressure 110/80, pulse 63 pulse oximetry 97% weight 233 pounds BMI 41.27 Skin warm and dry.  No cervical adenopathy.  TMs and pharynx are clear.  Neck is supple.  No carotid bruits.  Chest clear to auscultation.  Cardiac exam regular rate and rhythm normal S1 and S2.  Extremities without pitting edema.  Neuro without focal deficits on brief neurological exam.  Affect thought and judgment are normal.  Hemoglobin A1c excellent at 5.6%.  LDL is elevated at 122 and previously was 111 in December 2020.  Watch diet.  TSH stable on thyroid replacement at 2.80    Assessment & Plan:  Complains of dry cough but her chest is clear today.  Could be related to allergic rhinitis or viral respiratory infection.  Call me back if symptoms worsen.  Try Zithromax Z-PAK 2 p.o. day 1 followed by 1 p.o. days 2 through 5.  Essential hypertension-stable on current medication  Hyperlipidemia treated with statin  medication.  Watch diet.  LDL is slightly elevated.  Hypothyroidism-stable on current dose of thyroid replacement 75 mcg daily  Metabolic syndrome  Musculoskeletal pain and status post multiple arthroplasty.  Have given her Norco 10/325 number 15 tablets to take sparingly for musculoskeletal pain.  Meloxicam 15 mg to take with food daily for musculoskeletal pain.  Plan: Continue current medications, take Zithromax Z-PAK, take pain medication sparingly meloxicam once daily with a meal and follow-up in 6 months for Medicare wellness and health maintenance exam with fasting labs.

## 2019-08-27 LAB — LIPID PANEL
Cholesterol: 195 mg/dL (ref <200)
HDL: 56 mg/dL (ref >=50)
LDL Cholesterol (Calc): 122 mg/dL (calc) — ABNORMAL HIGH
Non-HDL Cholesterol (Calc): 139 mg/dL (calc) — ABNORMAL HIGH (ref <130)
Total CHOL/HDL Ratio: 3.5 (calc) (ref <5.0)
Triglycerides: 78 mg/dL (ref <150)

## 2019-08-27 LAB — HEPATIC FUNCTION PANEL
AG Ratio: 1.1 (calc) (ref 1.0–2.5)
ALT: 13 U/L (ref 6–29)
AST: 23 U/L (ref 10–35)
Albumin: 4.2 g/dL (ref 3.6–5.1)
Alkaline phosphatase (APISO): 55 U/L (ref 37–153)
Bilirubin, Direct: 0.1 mg/dL (ref 0.0–0.2)
Globulin: 4 g/dL (calc) — ABNORMAL HIGH (ref 1.9–3.7)
Indirect Bilirubin: 0.4 mg/dL (calc) (ref 0.2–1.2)
Total Bilirubin: 0.5 mg/dL (ref 0.2–1.2)
Total Protein: 8.2 g/dL — ABNORMAL HIGH (ref 6.1–8.1)

## 2019-08-27 LAB — HEMOGLOBIN A1C
Hgb A1c MFr Bld: 5.6 % of total Hgb (ref <5.7)
Mean Plasma Glucose: 114 (calc)
eAG (mmol/L): 6.3 (calc)

## 2019-08-27 LAB — TSH: TSH: 2.8 mIU/L (ref 0.40–4.50)

## 2019-09-30 ENCOUNTER — Telehealth: Payer: Self-pay

## 2019-09-30 ENCOUNTER — Telehealth: Payer: Self-pay | Admitting: Internal Medicine

## 2019-09-30 MED ORDER — HYDROCODONE-HOMATROPINE 5-1.5 MG/5ML PO SYRP: 5.0000 mL | ORAL_SOLUTION | Freq: Three times a day (TID) | ORAL | 0 refills | Status: DC | PRN

## 2019-09-30 NOTE — Telephone Encounter (Signed)
Called in [place] for cough as requested by her daughter who is a Marine scientist. Pt ,ay need OV if not improving.

## 2019-09-30 NOTE — Telephone Encounter (Signed)
[name] patient's daughter called patient still has a dry cough, she finished the antibiotic and she still coughing no fever. She wants to know if you can call her a cough syrup.    Call back number [phone]

## 2019-09-30 NOTE — Addendum Note (Signed)
Addended by: Mady Haagensen on: 09/30/2019 10:02 AM   Modules accepted: Orders

## 2019-09-30 NOTE — Telephone Encounter (Signed)
Please pend Hycodan 4 0z one tsp every 8 hours as needed for cough

## 2019-11-28 ENCOUNTER — Other Ambulatory Visit: Payer: Self-pay | Admitting: Internal Medicine

## 2019-12-29 DIAGNOSIS — Z23 Encounter for immunization: Secondary | ICD-10-CM | POA: Diagnosis not present

## 2020-02-20 ENCOUNTER — Other Ambulatory Visit: Payer: Self-pay

## 2020-02-20 ENCOUNTER — Encounter: Payer: Self-pay | Admitting: Internal Medicine

## 2020-02-20 ENCOUNTER — Ambulatory Visit (INDEPENDENT_AMBULATORY_CARE_PROVIDER_SITE_OTHER): Payer: Medicare Other | Admitting: Internal Medicine

## 2020-02-20 VITALS — BP 110/78 | HR 68 | Ht 63.0 in | Wt 234.0 lb

## 2020-02-20 DIAGNOSIS — I159 Secondary hypertension, unspecified: Secondary | ICD-10-CM

## 2020-02-20 DIAGNOSIS — Z96652 Presence of left artificial knee joint: Secondary | ICD-10-CM | POA: Diagnosis not present

## 2020-02-20 DIAGNOSIS — Z96612 Presence of left artificial shoulder joint: Secondary | ICD-10-CM | POA: Diagnosis not present

## 2020-02-20 DIAGNOSIS — G8929 Other chronic pain: Secondary | ICD-10-CM | POA: Diagnosis not present

## 2020-02-20 DIAGNOSIS — D126 Benign neoplasm of colon, unspecified: Secondary | ICD-10-CM

## 2020-02-20 DIAGNOSIS — Z96651 Presence of right artificial knee joint: Secondary | ICD-10-CM

## 2020-02-20 DIAGNOSIS — E1169 Type 2 diabetes mellitus with other specified complication: Secondary | ICD-10-CM

## 2020-02-20 DIAGNOSIS — E039 Hypothyroidism, unspecified: Secondary | ICD-10-CM

## 2020-02-20 DIAGNOSIS — E8881 Metabolic syndrome: Secondary | ICD-10-CM

## 2020-02-20 DIAGNOSIS — Z96641 Presence of right artificial hip joint: Secondary | ICD-10-CM

## 2020-02-20 DIAGNOSIS — E119 Type 2 diabetes mellitus without complications: Secondary | ICD-10-CM

## 2020-02-20 DIAGNOSIS — Z9841 Cataract extraction status, right eye: Secondary | ICD-10-CM

## 2020-02-20 DIAGNOSIS — E785 Hyperlipidemia, unspecified: Secondary | ICD-10-CM

## 2020-02-20 DIAGNOSIS — Z0000 Encounter for general adult medical examination without abnormal findings: Secondary | ICD-10-CM | POA: Diagnosis not present

## 2020-02-20 DIAGNOSIS — N1831 Chronic kidney disease, stage 3a: Secondary | ICD-10-CM

## 2020-02-20 DIAGNOSIS — M7918 Myalgia, other site: Secondary | ICD-10-CM

## 2020-02-20 DIAGNOSIS — R7302 Impaired glucose tolerance (oral): Secondary | ICD-10-CM | POA: Diagnosis not present

## 2020-02-20 DIAGNOSIS — I10 Essential (primary) hypertension: Secondary | ICD-10-CM

## 2020-02-20 DIAGNOSIS — Z9842 Cataract extraction status, left eye: Secondary | ICD-10-CM | POA: Diagnosis not present

## 2020-02-20 NOTE — Progress Notes (Signed)
Subjective:    Patient ID: [name], female    DOB: 1941/02/03, 79 y.o.   MRN: [id number]  HPI  79 year old Female for follow up. Hx of HTN, hyperlipidemia, hypothyroidism.generally feels well. Resides alone and does her own yard work.  No new complaints.  No falls.  No depression.  Has annual eye exam in [facility] by Dr. Jenne Campus.  Continues to drive to [place] without difficulty.  Status post left knee arthroplasty 2017.  Right knee arthroplasty November 2018.  Left shoulder arthroplasty by Dr. Veverly Fells October 2018.  Right hip arthroplasty 2018.  Colonoscopy by Dr. Hilarie Fredrickson October 2018 with 4 mm polyp removed from descending colon which was a tubular adenoma and 5-year follow-up recommended.  History of intertrigo, essential hypertension, hyperlipidemia, obesity, metabolic syndrome, controlled type 2 diabetes mellitus and hypothyroidism.  Diabetes is diet controlled at the present time.  No history of serious illnesses or accidents.  She has chronic kidney disease which is being observed.  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  Resides alone.  Does not smoke or consume alcohol.  Completed high school.  She does her own housework and has her own home.  Daughter is a Marine scientist and works with [employer] gastroenterology here in [place].  Has grandchildren and great-grandchildren.  Family history: Father died at age 52 of prostate cancer.  Mother died at age 29 of a stroke with history of hypertension.  1 brother with history of lung cancer.      Review of Systems  Constitutional: Negative.   HENT: Negative.   Respiratory: Negative.   Cardiovascular: Negative.   Gastrointestinal: Negative.   Genitourinary: Negative.   Musculoskeletal: Positive for arthralgias and myalgias.  Neurological: Negative.   Psychiatric/Behavioral: Negative.        Objective:   Physical Exam Vitals reviewed.   Constitutional:      Appearance: Normal appearance.  HENT:     Head: Normocephalic.     Right Ear: Tympanic membrane and external ear normal.     Left Ear: Tympanic membrane and external ear normal.     Nose: Nose normal.  Eyes:     General: No scleral icterus.    Extraocular Movements: Extraocular movements intact.     Conjunctiva/sclera: Conjunctivae normal.     Pupils: Pupils are equal, round, and reactive to light.  Neck:     Vascular: No carotid bruit.  Cardiovascular:     Rate and Rhythm: Normal rate and regular rhythm.     Heart sounds: Normal heart sounds. No murmur heard.   Pulmonary:     Effort: No respiratory distress.     Breath sounds: Normal breath sounds. No wheezing or rales.  Abdominal:     Palpations: Abdomen is soft. There is no mass.     Tenderness: There is no abdominal tenderness. There is no guarding or rebound.  Genitourinary:    Comments: Deferred Musculoskeletal:     Cervical back: Neck supple. No rigidity.     Right lower leg: No edema.     Left lower leg: No edema.  Lymphadenopathy:     Cervical: No cervical adenopathy.  Skin:    General: Skin is warm and dry.  Neurological:     General: No focal deficit present.     Mental Status: She is alert and oriented to person, place, and time.     Cranial Nerves: No cranial nerve deficit.  Sensory: No sensory deficit.     Motor: No weakness.     Coordination: Coordination normal.  Psychiatric:        Mood and Affect: Mood normal.        Behavior: Behavior normal.        Thought Content: Thought content normal.        Judgment: Judgment normal.    BP 110/78, pulse 68, pulse ox 98%, weight 234 pounds Ht 59ft  3in  BMI 41.45       Assessment & Plan:  Essential hypertension-stable on amlodipine and metoprolol as well as Lasix  Hyperlipidemia-treated with Zocor daily.  Had elevated LDL of 122 in June  Musculoskeletal pain treated with Mobic sparingly  Hypothyroidism-TSH in June was  normal  Impaired glucose tolerance stable at 5.6% in June  Chronic kidney disease-need to check creatinine at next visit  Status post right knee arthroplasty 2018,  Chronic kidney disease-being watched-we will repeat BUN and creatinine in June  Status post bilateral cataract extractions  History of macular degeneration of right eye  Reverse left shoulder arthroplasty June 2018  Right hip arthroplasty in 2018  For musculoskeletal pain she is given from time to time a small quantity of hydrocodone APAP (5 days)  Plan: Continue current regimen and follow-up in 6 months.  At that time she will need fasting lab work.  This was not done today.  Subjective:   Patient presents for Medicare Annual/Subsequent preventive examination.  Review Past Medical/Family/Social: See above   Risk Factors  Current exercise habits: Housework and yard work Dietary issues discussed: Low-fat low carbohydrate  Cardiac risk factors: Hyperlipidemia  Depression Screen  (Note: if answer to either of the following is "Yes", a more complete depression screening is indicated)   Over the past two weeks, have you felt down, depressed or hopeless? No  Over the past two weeks, have you felt little interest or pleasure in doing things?  Yes due to the pandemic Have you lost interest or pleasure in daily life? No Do you often feel hopeless? No Do you cry easily over simple problems? No   Activities of Daily Living  In your present state of health, do you have any difficulty performing the following activities?:   Driving? No  Managing money? No  Feeding yourself? No  Getting from bed to chair? No  Climbing a flight of stairs? No  Preparing food and eating?: No  Bathing or showering? No  Getting dressed: No  Getting to the toilet? No  Using the toilet:No  Moving around from place to place: No  In the past year have you fallen or had a near fall?:No  Are you sexually active? No  Do you have more than  one partner? No   Hearing Difficulties: No  Do you often ask people to speak up or repeat themselves? No  Do you experience ringing or noises in your ears?  Yes Do you have difficulty understanding soft or whispered voices?  Yes Do you feel that you have a problem with memory? No Do you often misplace items? No    Home Safety:  Do you have a smoke alarm at your residence? Yes Do you have grab bars in the bathroom?  No Do you have throw rugs in your house?  Yes   Cognitive Testing  Alert? Yes Normal Appearance?Yes  Oriented to person? Yes Place? Yes  Time? Yes  Recall of three objects? Yes  Can perform simple calculations? Yes  Displays  appropriate judgment?Yes  Can read the correct time from a watch face?Yes   List the Names of Other Physician/Practitioners you currently use:  See referral list for the physicians patient is currently seeing.     Review of Systems: See above   Objective:     General appearance: Appears  stated age and mildly obese  Head: Normocephalic, without obvious abnormality, atraumatic  Eyes: conj clear, EOMi PEERLA  Ears: normal TM's and external ear canals both ears  Nose: Nares normal. Septum midline. Mucosa normal. No drainage or sinus tenderness.  Throat: lips, mucosa, and tongue normal; teeth and gums normal  Neck: no adenopathy, no carotid bruit, no JVD, supple, symmetrical, trachea midline and thyroid not enlarged, symmetric, no tenderness/mass/nodules  No CVA tenderness.  Lungs: clear to auscultation bilaterally  Breasts: normal appearance, no masses or tenderness Heart: regular rate and rhythm, S1, S2 normal, no murmur, click, rub or gallop  Abdomen: soft, non-tender; bowel sounds normal; no masses, no organomegaly  Musculoskeletal: ROM normal in all joints, no crepitus, no deformity, Normal muscle strengthen. Back  is symmetric, no curvature. Skin: Skin color, texture, turgor normal. No rashes or lesions  Lymph nodes: Cervical,  supraclavicular, and axillary nodes normal.  Neurologic: CN 2 -12 Normal, Normal symmetric reflexes. Normal coordination and gait  Psych: Alert & Oriented x 3, Mood appear stable.    Assessment:    Annual wellness medicare exam   Plan:    During the course of the visit the patient was educated and counseled about appropriate screening and preventive services including:  Had mammogram in December 2020      Patient Instructions (the written plan) was given to the patient.  Medicare Attestation  I have personally reviewed:  The patient's medical and social history  Their use of alcohol, tobacco or illicit drugs  Their current medications and supplements  The patient's functional ability including ADLs,fall risks, home safety risks, cognitive, and hearing and visual impairment  Diet and physical activities  Evidence for depression or mood disorders  The patient's weight, height, BMI, and visual acuity have been recorded in the chart. I have made referrals, counseling, and provided education to the patient based on review of the above and I have provided the patient with a written personalized care plan for preventive services.

## 2020-02-23 ENCOUNTER — Telehealth: Payer: Self-pay

## 2020-02-23 NOTE — Telephone Encounter (Signed)
Patients daughter called and is complaining of congestion.  Unsure if patient has tried over the counter medication.  Unsure of fever per daughter.  Patient request something be sent to the pharmacy.

## 2020-02-23 NOTE — Telephone Encounter (Signed)
Please call patient and see what is going on

## 2020-02-24 ENCOUNTER — Telehealth: Payer: Medicare Other | Admitting: Internal Medicine

## 2020-02-24 NOTE — Telephone Encounter (Signed)
After speaking with Dr Renold Genta scheduled phone visit

## 2020-02-24 NOTE — Telephone Encounter (Signed)
After scheduling appointment at 4:00, patient called back and said she needs to take her son to therapy at that time, to cancel appointment and if she gets worse she will come up here for appointment.

## 2020-02-24 NOTE — Telephone Encounter (Signed)
Left message for call back from patient

## 2020-02-24 NOTE — Telephone Encounter (Signed)
[name] called back to say she had fever yesterday and stayed in bed all day, sneezing, coughing at night, head congestion, eyes heavy, has had COVID Booster, No COVID exposure

## 2020-02-25 ENCOUNTER — Telehealth: Payer: Self-pay | Admitting: Internal Medicine

## 2020-02-25 ENCOUNTER — Encounter: Payer: Self-pay | Admitting: Internal Medicine

## 2020-02-25 MED ORDER — AZITHROMYCIN 250 MG PO TABS: ORAL_TABLET | ORAL | 0 refills | Status: DC

## 2020-02-25 NOTE — Telephone Encounter (Signed)
We could not connect by phone with patient yesterday afternoon regarding URI as she had another commitment. I tried calling her today and got voice mail. Left message with her daughter as well as patient voice mail that I am sending in Rx for her as she has URI symptoms. Sent in Zithromax Z-pam 2 po day 1 followed by one po days 2-5

## 2020-03-02 ENCOUNTER — Other Ambulatory Visit: Payer: Self-pay | Admitting: Internal Medicine

## 2020-04-07 ENCOUNTER — Encounter: Payer: Self-pay | Admitting: Internal Medicine

## 2020-04-07 NOTE — Patient Instructions (Signed)
It was a pleasure to see you today.  Continue current medications.  Watch diet.  Return in 6 months.  Will need fasting labs at that time.

## 2020-04-11 ENCOUNTER — Other Ambulatory Visit: Payer: Self-pay | Admitting: Internal Medicine

## 2020-04-11 DIAGNOSIS — Z1231 Encounter for screening mammogram for malignant neoplasm of breast: Secondary | ICD-10-CM

## 2020-05-25 ENCOUNTER — Other Ambulatory Visit: Payer: Self-pay | Admitting: Internal Medicine

## 2020-08-17 ENCOUNTER — Other Ambulatory Visit: Payer: Self-pay | Admitting: Internal Medicine

## 2020-08-20 ENCOUNTER — Ambulatory Visit
Admission: RE | Admit: 2020-08-20 | Discharge: 2020-08-20 | Disposition: A | Discharge status: Home / Self Care | Payer: Medicare Other | Source: Ambulatory Visit | Attending: Internal Medicine | Admitting: Internal Medicine

## 2020-08-20 ENCOUNTER — Other Ambulatory Visit: Payer: Self-pay

## 2020-08-20 ENCOUNTER — Ambulatory Visit (INDEPENDENT_AMBULATORY_CARE_PROVIDER_SITE_OTHER): Payer: Medicare Other | Admitting: Internal Medicine

## 2020-08-20 ENCOUNTER — Encounter: Payer: Self-pay | Admitting: Internal Medicine

## 2020-08-20 VITALS — BP 110/80 | HR 76 | Ht 63.0 in | Wt 232.0 lb

## 2020-08-20 DIAGNOSIS — E782 Mixed hyperlipidemia: Secondary | ICD-10-CM | POA: Diagnosis not present

## 2020-08-20 DIAGNOSIS — Z1231 Encounter for screening mammogram for malignant neoplasm of breast: Secondary | ICD-10-CM

## 2020-08-20 DIAGNOSIS — N1831 Chronic kidney disease, stage 3a: Secondary | ICD-10-CM | POA: Diagnosis not present

## 2020-08-20 DIAGNOSIS — R7302 Impaired glucose tolerance (oral): Secondary | ICD-10-CM

## 2020-08-20 DIAGNOSIS — E039 Hypothyroidism, unspecified: Secondary | ICD-10-CM

## 2020-08-20 DIAGNOSIS — I159 Secondary hypertension, unspecified: Secondary | ICD-10-CM

## 2020-08-20 MED ORDER — HYDROCODONE-ACETAMINOPHEN 10-325 MG PO TABS: 1.0000 | ORAL_TABLET | Freq: Three times a day (TID) | ORAL | 0 refills | Status: AC | PRN

## 2020-08-20 MED ORDER — TORSEMIDE 20 MG PO TABS: 20.0000 mg | ORAL_TABLET | Freq: Every day | ORAL | 1 refills | Status: DC

## 2020-08-20 NOTE — Patient Instructions (Signed)
It was a pleasure to see you today.  Change Lasix to Demadex 20 mg daily and let me know in 3 to 4 weeks how leg swelling is doing.  Labs drawn and pending with further instructions for follow-up.  Mammogram done today and results are pending.  Return in 6 months for Medicare wellness visit and health maintenance exam.

## 2020-08-20 NOTE — Progress Notes (Signed)
   Subjective:    Patient ID: [name], female    DOB: Jun 15, 1940, 80 y.o.   MRN: [id number]  HPI  80 year old vibrant Female ,continues to drive ,here for 6 month recheck. Fasting labs drawn and are pending.  She is having more dependent edema recently. Weather is hotter. She is on Lasix but also takes amlodipine which can contribute to LE edema as well as Mobic and Metoprolol. Has nonpitting edema this am. Legs feel tight to her. They look a bit puffy.  Due for colonoscopy in 2023.  Has annual diabetic eye exam by Dr. Jenne Campus in [place].  No falls.  No depression.  Immunizations reviewed.  Are up-to-date with the exception of tetanus immunization which Medicare will not cover unless there is an injury.  Had mammogram today as well.  Results are pending.  Review of Systems see above, denies chest pain or SOB     Objective:   Physical Exam BP 100/80 pulse 76, pulse ox 97% , weight 232 pounds, Height 41ft. 3 in BMI 41.10  Skin warm and dry.  Nodes none.  Neck is supple without JVD thyromegaly or carotid bruits.  Chest is clear to auscultation.  Cardiac exam: Regular rate and rhythm.  Legs appear to be slightly puffy but no pitting edema.     Assessment & Plan:  Lower extremity edema could be related to medication and/or hot weather or both  Plan: Discontinue Lasix and treat with Demadex 20 mg daily.  Call me with progress report in 3 to 4 weeks and let me know how Demadex is working for her compared to Lasix.  Labs drawn and pending with further comments regarding these results to follow.  Overall she appears to be doing well and will be seen again for health maintenance exam and Medicare wellness visit in 6 months.

## 2020-08-21 LAB — COMPLETE METABOLIC PANEL WITH GFR
AG Ratio: 1 (calc) (ref 1.0–2.5)
ALT: 11 U/L (ref 6–29)
AST: 26 U/L (ref 10–35)
Albumin: 4.2 g/dL (ref 3.6–5.1)
Alkaline phosphatase (APISO): 50 U/L (ref 37–153)
BUN/Creatinine Ratio: 15 (calc) (ref 6–22)
BUN: 20 mg/dL (ref 7–25)
CO2: 29 mmol/L (ref 20–32)
Calcium: 10 mg/dL (ref 8.6–10.4)
Chloride: 103 mmol/L (ref 98–110)
Creat: 1.31 mg/dL — ABNORMAL HIGH (ref 0.60–0.88)
GFR, Est African American: 44 mL/min/{1.73_m2} — ABNORMAL LOW (ref >=60)
GFR, Est Non African American: 38 mL/min/{1.73_m2} — ABNORMAL LOW (ref >=60)
Globulin: 4.2 g/dL (calc) — ABNORMAL HIGH (ref 1.9–3.7)
Glucose, Bld: 90 mg/dL (ref 65–99)
Potassium: 4 mmol/L (ref 3.5–5.3)
Sodium: 140 mmol/L (ref 135–146)
Total Bilirubin: 0.6 mg/dL (ref 0.2–1.2)
Total Protein: 8.4 g/dL — ABNORMAL HIGH (ref 6.1–8.1)

## 2020-08-21 LAB — MICROALBUMIN / CREATININE URINE RATIO
Creatinine, Urine: 155 mg/dL (ref 20–275)
Microalb Creat Ratio: 10 mcg/mg creat (ref <30)
Microalb, Ur: 1.6 mg/dL

## 2020-08-21 LAB — HEMOGLOBIN A1C
Hgb A1c MFr Bld: 5.6 % of total Hgb (ref <5.7)
Mean Plasma Glucose: 114 mg/dL
eAG (mmol/L): 6.3 mmol/L

## 2020-08-21 LAB — LIPID PANEL
Cholesterol: 195 mg/dL (ref <200)
HDL: 56 mg/dL (ref >=50)
LDL Cholesterol (Calc): 120 mg/dL (calc) — ABNORMAL HIGH
Non-HDL Cholesterol (Calc): 139 mg/dL (calc) — ABNORMAL HIGH (ref <130)
Total CHOL/HDL Ratio: 3.5 (calc) (ref <5.0)
Triglycerides: 91 mg/dL (ref <150)

## 2020-08-21 LAB — TSH: TSH: 1.86 mIU/L (ref 0.40–4.50)

## 2020-08-22 ENCOUNTER — Other Ambulatory Visit: Payer: Self-pay

## 2020-08-22 MED ORDER — SIMVASTATIN 20 MG PO TABS: 20.0000 mg | ORAL_TABLET | Freq: Every day | ORAL | 1 refills | Status: DC

## 2020-09-25 ENCOUNTER — Other Ambulatory Visit: Payer: Self-pay | Admitting: Internal Medicine

## 2020-11-21 ENCOUNTER — Other Ambulatory Visit: Payer: Self-pay | Admitting: Internal Medicine

## 2021-02-19 ENCOUNTER — Other Ambulatory Visit: Payer: Self-pay | Admitting: Internal Medicine

## 2021-02-19 NOTE — Telephone Encounter (Signed)
Last appt 6/13. AWV with nurse 12/16.

## 2021-02-22 ENCOUNTER — Other Ambulatory Visit: Payer: Self-pay

## 2021-02-22 ENCOUNTER — Ambulatory Visit (INDEPENDENT_AMBULATORY_CARE_PROVIDER_SITE_OTHER): Payer: Medicare Other | Admitting: Internal Medicine

## 2021-02-22 ENCOUNTER — Encounter: Payer: Self-pay | Admitting: Internal Medicine

## 2021-02-22 VITALS — BP 112/68 | HR 61 | Temp 98.4°F | Ht 62.25 in | Wt 228.0 lb

## 2021-02-22 DIAGNOSIS — R7302 Impaired glucose tolerance (oral): Secondary | ICD-10-CM

## 2021-02-22 DIAGNOSIS — D126 Benign neoplasm of colon, unspecified: Secondary | ICD-10-CM

## 2021-02-22 DIAGNOSIS — Z9842 Cataract extraction status, left eye: Secondary | ICD-10-CM

## 2021-02-22 DIAGNOSIS — Z96651 Presence of right artificial knee joint: Secondary | ICD-10-CM

## 2021-02-22 DIAGNOSIS — M7918 Myalgia, other site: Secondary | ICD-10-CM

## 2021-02-22 DIAGNOSIS — Z0000 Encounter for general adult medical examination without abnormal findings: Secondary | ICD-10-CM

## 2021-02-22 DIAGNOSIS — Z96641 Presence of right artificial hip joint: Secondary | ICD-10-CM

## 2021-02-22 DIAGNOSIS — I10 Essential (primary) hypertension: Secondary | ICD-10-CM | POA: Diagnosis not present

## 2021-02-22 DIAGNOSIS — E782 Mixed hyperlipidemia: Secondary | ICD-10-CM | POA: Diagnosis not present

## 2021-02-22 DIAGNOSIS — E039 Hypothyroidism, unspecified: Secondary | ICD-10-CM | POA: Diagnosis not present

## 2021-02-22 DIAGNOSIS — N1831 Chronic kidney disease, stage 3a: Secondary | ICD-10-CM

## 2021-02-22 DIAGNOSIS — Z96652 Presence of left artificial knee joint: Secondary | ICD-10-CM

## 2021-02-22 DIAGNOSIS — Z96612 Presence of left artificial shoulder joint: Secondary | ICD-10-CM

## 2021-02-22 DIAGNOSIS — G8929 Other chronic pain: Secondary | ICD-10-CM

## 2021-02-22 DIAGNOSIS — R82998 Other abnormal findings in urine: Secondary | ICD-10-CM

## 2021-02-22 DIAGNOSIS — Z9841 Cataract extraction status, right eye: Secondary | ICD-10-CM

## 2021-02-22 DIAGNOSIS — Z6841 Body Mass Index (BMI) 40.0 and over, adult: Secondary | ICD-10-CM

## 2021-02-22 LAB — POCT URINALYSIS DIPSTICK
Bilirubin, UA: NEGATIVE
Glucose, UA: NEGATIVE
Nitrite, UA: NEGATIVE
Protein, UA: NEGATIVE
Spec Grav, UA: 1.01 (ref 1.010–1.025)
Urobilinogen, UA: 0.2 E.U./dL
pH, UA: 5 (ref 5.0–8.0)

## 2021-02-22 MED ORDER — CYCLOBENZAPRINE HCL 10 MG PO TABS
10.0000 mg | ORAL_TABLET | Freq: Three times a day (TID) | ORAL | 0 refills | Status: DC | PRN
Start: 2021-02-22 — End: 2022-10-24

## 2021-02-22 MED ORDER — HYDROCODONE-ACETAMINOPHEN 10-325 MG PO TABS: ORAL_TABLET | ORAL | 0 refills | Status: DC

## 2021-02-22 NOTE — Progress Notes (Signed)
Annual Wellness Visit     Patient: [name], Female    DOB: 1940/11/14, 80 y.o.   MRN: [id number] Visit Date: 02/22/2021  Chief Complaint  Patient presents with   Medicare [place] is a 80 y.o. female who presents today for her Annual Wellness Visit.  HPI 80 year old Female also presents for health maintenance exam and evaluation of medical issues. Has annual eye exam in [facility] by Dr. Jenne Campus.  Continues to drive to [place] without difficulty.  No new complaints.  No falls.  No depression.  She has a history of hypertension, hyperlipidemia, hypothyroidism.  History of left knee arthroplasty 2017.  Right knee arthroplasty November 2018.  Left shoulder arthroplasty by Dr. Veverly Fells October 2018.  Right hip arthroplasty 2018.  History of intertrigo, essential hypertension, hyperlipidemia, obesity, metabolic syndrome, controlled type 2 diabetes mellitus, and hypothyroidism.  Diabetes is diet controlled at the present time.  She has chronic kidney disease which is being observed.  No history of serious illnesses or accidents.  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  Resides alone.  Does not smoke or consume alcohol.  Completed high school.  She does her own housework and owns her own home.  Daughter is a Marine scientist and works with low [employer] operational officer here in [place].  Patient has grandchildren and great-grandchildren.  Family history: Father died at age 71 of prostate cancer.  Mother died at age 23 of a stroke with history of hypertension.  1 brother with history of lung cancer.  Social History   Social History Narrative   Not on file  Retired ,resides alone, still drives, does her own yard work  Patient Care Team: Arsal Tappan, Cresenciano Lick, MD as PCP - General (Internal Medicine)  Review of Systems has had Covid vaccines and flu vaccine.  No new complaints    Objective    Vitals: BP 112/68    Pulse 61    Temp 98.4 F (36.9 C) (Tympanic)    Ht 5' 2.25" (1.581 m)    Wt 228 lb (103.4 kg)    SpO2 98%    BMI 41.37 kg/m   Physical Exam  Skin: Warm and dry.  No cervical adenopathy.  No carotid bruits.  Chest is clear.  Cardiac exam: Regular rate and rhythm without ectopy or murmurs.  Breast are without masses.  Abdomen is soft nondistended without hepatosplenomegaly masses or tenderness.  No lower extremity pitting edema.  Affect thought and judgment are normal.  No focal deficits on brief neurological exam.   Most recent functional status assessment: In your present state of health, do you have any difficulty performing the following activities: 02/22/2021  Hearing? N  Vision? N  Difficulty concentrating or making decisions? Y  Walking or climbing stairs? Y  Dressing or bathing? N  Doing errands, shopping? Y  Preparing Food and eating ? N  Using the Toilet? N  In the past six months, have you accidently leaked urine? N  Do you have problems with loss of bowel control? N  Managing your Medications? N  Managing your Finances? N  Housekeeping or managing your Housekeeping? N  Some recent data might be hidden   Most recent fall risk assessment: Fall Risk  02/22/2021  Falls in the past year? 0  Comment -  Number falls in past yr: 0  Injury with Fall? 0  Risk for fall due to : Impaired balance/gait  Follow up Falls evaluation completed    Most recent depression screenings: PHQ 2/9 Scores 02/22/2021 08/20/2020  PHQ - 2 Score 0 0   Most recent cognitive screening: 6CIT Screen 02/22/2021  What Year? 0 points  What month? 0 points  What time? 0 points  Count back from 20 0 points  Months in reverse 4 points  Repeat phrase 0 points  Total Score 4       Assessment & Plan     Annual wellness visit done today including the all of the following: Reviewed patient's Family Medical History Reviewed and updated list of patient's medical  providers Assessment of cognitive impairment was done Assessed patient's functional ability Established a written schedule for health screening [place] Completed and Reviewed  Discussed health benefits of physical activity, and encouraged her to engage in regular exercise appropriate for her age and condition.    Essential hypertension stable on amlodipine and metoprolol as well as torsemide 20 mg daily.  Blood pressure 112/68  History of bilateral cataract extractions  Impaired glucose tolerance-treated with diet.  Fasting glucose is 78.  Abnormal urine dipstick-culture revealed mixed genital flora.  She is asymptomatic with regard to UTI symptoms.  This will not be treated.  BMI 41.37.  Continue diet and exercise efforts.  Has gained 7 pounds since last year.  Hyperlipidemia treated with Zocor 20 daily.  LDL is slightly elevated at 116.  Total cholesterol 189 and triglycerides 75  Musculoskeletal pain treated with Mobic sparingly.  Reminded patient she has chronic kidney disease and to take this sparingly.  Chronic kidney disease stage IIIa and creatinine is 1.26 and stable  Hypothyroidism-is on thyroid replacement medication consisting of levothyroxine 0.075 mg daily.  TSH 2.93 and within normal limits  She had abnormal urine dipstick and culture grew mixed flora.  She is asymptomatic.   IElby Showers, MD, have reviewed all documentation for this visit. The documentation on 04/06/21 for the exam, diagnosis, procedures, and orders are all accurate and complete.   Angus Seller, CMA

## 2021-02-23 LAB — COMPLETE METABOLIC PANEL WITH GFR
AG Ratio: 1 (calc) (ref 1.0–2.5)
ALT: 8 U/L (ref 6–29)
AST: 21 U/L (ref 10–35)
Albumin: 3.8 g/dL (ref 3.6–5.1)
Alkaline phosphatase (APISO): 54 U/L (ref 37–153)
BUN/Creatinine Ratio: 21 (calc) (ref 6–22)
BUN: 26 mg/dL — ABNORMAL HIGH (ref 7–25)
CO2: 29 mmol/L (ref 20–32)
Calcium: 9.6 mg/dL (ref 8.6–10.4)
Chloride: 102 mmol/L (ref 98–110)
Creat: 1.26 mg/dL — ABNORMAL HIGH (ref 0.60–0.95)
Globulin: 4 g/dL (calc) — ABNORMAL HIGH (ref 1.9–3.7)
Glucose, Bld: 78 mg/dL (ref 65–99)
Potassium: 4.5 mmol/L (ref 3.5–5.3)
Sodium: 142 mmol/L (ref 135–146)
Total Bilirubin: 0.6 mg/dL (ref 0.2–1.2)
Total Protein: 7.8 g/dL (ref 6.1–8.1)
eGFR: 43 mL/min/{1.73_m2} — ABNORMAL LOW (ref >=60)

## 2021-02-23 LAB — CBC WITH DIFFERENTIAL/PLATELET
Absolute Monocytes: 476 cells/uL (ref 200–950)
Basophils Absolute: 20 cells/uL (ref 0–200)
Basophils Relative: 0.5 %
Eosinophils Absolute: 108 cells/uL (ref 15–500)
Eosinophils Relative: 2.7 %
HCT: 36.2 % (ref 35.0–45.0)
Hemoglobin: 12.2 g/dL (ref 11.7–15.5)
Lymphs Abs: 1764 cells/uL (ref 850–3900)
MCH: 32.1 pg (ref 27.0–33.0)
MCHC: 33.7 g/dL (ref 32.0–36.0)
MCV: 95.3 fL (ref 80.0–100.0)
MPV: 10.6 fL (ref 7.5–12.5)
Monocytes Relative: 11.9 %
Neutro Abs: 1632 cells/uL (ref 1500–7800)
Neutrophils Relative %: 40.8 %
Platelets: 239 10*3/uL (ref 140–400)
RBC: 3.8 10*6/uL (ref 3.80–5.10)
RDW: 12.5 % (ref 11.0–15.0)
Total Lymphocyte: 44.1 %
WBC: 4 10*3/uL (ref 3.8–10.8)

## 2021-02-23 LAB — LIPID PANEL
Cholesterol: 189 mg/dL (ref <200)
HDL: 56 mg/dL (ref >=50)
LDL Cholesterol (Calc): 116 mg/dL (calc) — ABNORMAL HIGH
Non-HDL Cholesterol (Calc): 133 mg/dL (calc) — ABNORMAL HIGH (ref <130)
Total CHOL/HDL Ratio: 3.4 (calc) (ref <5.0)
Triglycerides: 75 mg/dL (ref <150)

## 2021-02-23 LAB — TSH: TSH: 2.93 mIU/L (ref 0.40–4.50)

## 2021-02-24 LAB — URINE CULTURE
MICRO NUMBER:: 12767483
SPECIMEN QUALITY:: ADEQUATE

## 2021-02-26 NOTE — Progress Notes (Signed)
Left message for patient to return call to office at [phone].  

## 2021-02-28 NOTE — Progress Notes (Signed)
Results have been relayed to the patient. The patient verbalized understanding. No questions at this time.   

## 2021-04-06 NOTE — Patient Instructions (Signed)
Continue diet and exercise efforts.  You have gained 7 pounds since last year.  Continue current medications and follow-up in June 2023.  It was a pleasure as always to see you today.

## 2021-05-15 ENCOUNTER — Telehealth: Payer: Self-pay | Admitting: Internal Medicine

## 2021-05-15 NOTE — Telephone Encounter (Signed)
LVM that appt. On 08/30/21 time had changed from 9:45 to 9:30. To CB if that was problem. ?

## 2021-05-24 ENCOUNTER — Other Ambulatory Visit: Payer: Self-pay | Admitting: Internal Medicine

## 2021-06-24 ENCOUNTER — Other Ambulatory Visit: Payer: Self-pay | Admitting: Internal Medicine

## 2021-08-15 ENCOUNTER — Other Ambulatory Visit: Payer: Self-pay | Admitting: Internal Medicine

## 2021-08-21 ENCOUNTER — Telehealth: Payer: Self-pay | Admitting: Internal Medicine

## 2021-08-21 NOTE — Telephone Encounter (Signed)
Talk with Adela Lank she is going to [facility] when she has her schedule to reschedule this appointment

## 2021-08-21 NOTE — Telephone Encounter (Signed)
LVM to CB to re-schedule 6 month follow up, provider out of office.

## 2021-08-23 ENCOUNTER — Ambulatory Visit: Payer: Medicare Other | Admitting: Internal Medicine

## 2021-08-30 ENCOUNTER — Ambulatory Visit: Payer: Medicare HMO | Admitting: Internal Medicine

## 2021-09-19 ENCOUNTER — Encounter: Payer: Self-pay | Admitting: Internal Medicine

## 2021-09-19 ENCOUNTER — Ambulatory Visit: Payer: Medicare HMO

## 2021-09-19 ENCOUNTER — Ambulatory Visit (INDEPENDENT_AMBULATORY_CARE_PROVIDER_SITE_OTHER): Payer: Medicare HMO | Admitting: Internal Medicine

## 2021-09-19 VITALS — BP 122/82 | HR 67 | Temp 97.6°F | Wt 246.5 lb

## 2021-09-19 DIAGNOSIS — E039 Hypothyroidism, unspecified: Secondary | ICD-10-CM | POA: Diagnosis not present

## 2021-09-19 DIAGNOSIS — I10 Essential (primary) hypertension: Secondary | ICD-10-CM

## 2021-09-19 DIAGNOSIS — N1831 Chronic kidney disease, stage 3a: Secondary | ICD-10-CM | POA: Diagnosis not present

## 2021-09-19 DIAGNOSIS — E782 Mixed hyperlipidemia: Secondary | ICD-10-CM

## 2021-09-19 MED ORDER — HYDROCODONE-ACETAMINOPHEN 10-325 MG PO TABS: 1.0000 | ORAL_TABLET | Freq: Three times a day (TID) | ORAL | 0 refills | Status: AC | PRN

## 2021-09-19 NOTE — Patient Instructions (Addendum)
My staff is to call [facility] and get dates of your vaccines. Hydrocodone APAP 10/325 (5-day supply) refilled. Labs drawn and pending. We will call you with results. It was a pleasure to see you today. RTC in 6 months.  Addendum: Labs are stable

## 2021-09-19 NOTE — Progress Notes (Signed)
   Subjective:    Patient ID: [name], female    DOB: 1941-01-25, 81 y.o.   MRN: [id number]  HPI 81 year old Female seen for 6 month reccheck. 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  Has had one Shingrix vaccine at [facility] in [place]. Tetanus vaccine given there also. These were done in March and are in [place].  Lipid panel and TSH checked today.  She has chronic kidney disease stage IIIa which is being observed and followed here.  History of hypertension, hyperlipidemia, obesity, metabolic syndrome, controlled type 2 diabetes mellitus.  Left shoulder arthroplasty by Dr. Alma Friendly October 2018, right hip arthroplasty 2018.  Diabetes is diet controlled at the present time.  Review of Systems no new complaints except above     Objective:   Physical Exam   BP 122/82, pulse 67, T 97.6 degrees, pulse ox 98%, Weight 246 lb. 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 Have

## 2021-09-20 LAB — COMPLETE METABOLIC PANEL WITH GFR
AG Ratio: 1 (calc) (ref 1.0–2.5)
ALT: 9 U/L (ref 6–29)
AST: 20 U/L (ref 10–35)
Albumin: 3.9 g/dL (ref 3.6–5.1)
Alkaline phosphatase (APISO): 59 U/L (ref 37–153)
BUN/Creatinine Ratio: 9 (calc) (ref 6–22)
BUN: 11 mg/dL (ref 7–25)
CO2: 28 mmol/L (ref 20–32)
Calcium: 9.4 mg/dL (ref 8.6–10.4)
Chloride: 105 mmol/L (ref 98–110)
Creat: 1.28 mg/dL — ABNORMAL HIGH (ref 0.60–0.95)
Globulin: 3.8 g/dL (calc) — ABNORMAL HIGH (ref 1.9–3.7)
Glucose, Bld: 80 mg/dL (ref 65–99)
Potassium: 4.8 mmol/L (ref 3.5–5.3)
Sodium: 140 mmol/L (ref 135–146)
Total Bilirubin: 0.6 mg/dL (ref 0.2–1.2)
Total Protein: 7.7 g/dL (ref 6.1–8.1)
eGFR: 42 mL/min/{1.73_m2} — ABNORMAL LOW (ref >=60)

## 2021-09-20 LAB — MICROALBUMIN, URINE: Microalb, Ur: <0.2 mg/dL

## 2021-09-20 LAB — HEPATIC FUNCTION PANEL
AG Ratio: 1 (calc) (ref 1.0–2.5)
ALT: 9 U/L (ref 6–29)
AST: 20 U/L (ref 10–35)
Albumin: 3.9 g/dL (ref 3.6–5.1)
Alkaline phosphatase (APISO): 59 U/L (ref 37–153)
Bilirubin, Direct: 0.1 mg/dL (ref 0.0–0.2)
Globulin: 3.8 g/dL (calc) — ABNORMAL HIGH (ref 1.9–3.7)
Indirect Bilirubin: 0.5 mg/dL (calc) (ref 0.2–1.2)
Total Bilirubin: 0.6 mg/dL (ref 0.2–1.2)
Total Protein: 7.7 g/dL (ref 6.1–8.1)

## 2021-09-20 LAB — LIPID PANEL
Cholesterol: 199 mg/dL (ref <200)
HDL: 59 mg/dL (ref >=50)
LDL Cholesterol (Calc): 120 mg/dL (calc) — ABNORMAL HIGH
Non-HDL Cholesterol (Calc): 140 mg/dL (calc) — ABNORMAL HIGH (ref <130)
Total CHOL/HDL Ratio: 3.4 (calc) (ref <5.0)
Triglycerides: 98 mg/dL (ref <150)

## 2021-09-20 LAB — TSH: TSH: 4.97 mIU/L — ABNORMAL HIGH (ref 0.40–4.50)

## 2021-09-25 ENCOUNTER — Other Ambulatory Visit: Payer: Self-pay

## 2021-09-25 MED ORDER — LEVOTHYROXINE SODIUM 100 MCG PO TABS: 100.0000 ug | ORAL_TABLET | Freq: Every day | ORAL | 0 refills | Status: DC

## 2021-11-06 ENCOUNTER — Encounter: Payer: Self-pay | Admitting: Internal Medicine

## 2021-11-18 ENCOUNTER — Telehealth: Payer: Self-pay

## 2021-11-18 ENCOUNTER — Ambulatory Visit (INDEPENDENT_AMBULATORY_CARE_PROVIDER_SITE_OTHER): Payer: Medicare HMO | Admitting: Internal Medicine

## 2021-11-18 ENCOUNTER — Encounter: Payer: Self-pay | Admitting: Internal Medicine

## 2021-11-18 VITALS — BP 122/70 | HR 64 | Temp 98.1°F | Ht 62.25 in | Wt 231.0 lb

## 2021-11-18 DIAGNOSIS — E039 Hypothyroidism, unspecified: Secondary | ICD-10-CM

## 2021-11-18 DIAGNOSIS — E782 Mixed hyperlipidemia: Secondary | ICD-10-CM

## 2021-11-18 DIAGNOSIS — Z6841 Body Mass Index (BMI) 40.0 and over, adult: Secondary | ICD-10-CM | POA: Diagnosis not present

## 2021-11-18 DIAGNOSIS — I10 Essential (primary) hypertension: Secondary | ICD-10-CM

## 2021-11-18 DIAGNOSIS — N1831 Chronic kidney disease, stage 3a: Secondary | ICD-10-CM | POA: Diagnosis not present

## 2021-11-18 DIAGNOSIS — G8929 Other chronic pain: Secondary | ICD-10-CM | POA: Diagnosis not present

## 2021-11-18 DIAGNOSIS — M7918 Myalgia, other site: Secondary | ICD-10-CM | POA: Diagnosis not present

## 2021-11-18 LAB — POCT URINALYSIS DIPSTICK
Bilirubin, UA: NEGATIVE
Blood, UA: NEGATIVE
Glucose, UA: NEGATIVE
Ketones, UA: NEGATIVE
Leukocytes, UA: NEGATIVE
Nitrite, UA: NEGATIVE
Protein, UA: NEGATIVE
Spec Grav, UA: 1.01 (ref 1.010–1.025)
Urobilinogen, UA: 0.2 E.U./dL
pH, UA: 5 (ref 5.0–8.0)

## 2021-11-18 MED ORDER — HYDROCODONE-ACETAMINOPHEN 10-325 MG PO TABS: 1.0000 | ORAL_TABLET | Freq: Three times a day (TID) | ORAL | 0 refills | Status: AC | PRN

## 2021-11-18 NOTE — Telephone Encounter (Signed)
Arexvy (RSV) 0.5 ml 10/16/21 [place]

## 2021-11-18 NOTE — Patient Instructions (Addendum)
Thyroid functions checked on new dose of thyroid replacement medication.  She has flu vaccine appointment soon at her local health department.  I have refilled hydrocodone APAP which she takes sparingly for musculoskeletal pain.  She will have Medicare annual wellness visit by telephone in December and will be seen here in January.  She will continue with Zocor 20 mg daily, continue with meloxicam 15 mg daily for musculoskeletal pain and continue with torsemide and amlodipine for hypertension.

## 2021-11-18 NOTE — Progress Notes (Signed)
   Subjective:    Patient ID: [name], female    DOB: [birth year], 81 y.o.   MRN: [id number]  HPI  81 year old Female seen for follow up on hypothyroidism. Says it took time to adjust to new dose of thyroid medication. Says she felt bad early on on higher dose of synthroid and that BP ran low.  She was here in mid July and TSH was elevated at 4.97.  She denies missing doses of levothyroxine.  Levothyroxine was increased to 100 mcg daily.  TSH was repeated today and results are pending.  She has hypertension, hyperlipidemia, obesity, metabolic syndrome and diet-controlled controlled type 2 diabetes mellitus.  Getting flu vaccine at her local health department.  She has chronic kidney disease stage IIIa which is being observed and is stable.  History of osteoarthritis right shoulder.  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  She had left shoulder arthroplasty by Dr. Alma Friendly in October 2018.  Right hip arthroplasty in 2018.  Review of Systems     Objective:   Physical Exam  Blood pressure 122/70 pulse 64 temperature 98.1 degrees weight 231 pounds height 5 feet 2.25 inches BMI 41.91  Neck is supple without JVD thyromegaly or carotid bruits.  Chest is clear to auscultation without rales or wheezing.  Cardiac exam: Regular rate and rhythm without murmur or ectopy.  No lower extremity pitting edema.      Assessment & Plan:  Chronic musculoskeletal pain-have refilled hydrocodone APAP for 5 days only which she takes sparingly and appropriately she also takes meloxicam 15 mg daily  Hypothyroidism-had elevated TSH at last visit and TSH today is rechecked with results pending.  In December 2022 TSH was 2.93.  She currently is on levothyroxine 100 mcg daily  Hyperlipidemia-she takes Zocor 20 mg daily  Stage IIIa chronic kidney disease-stable  Hypertension she takes torsemide 20 mg daily and amlodipine 5 mg daily.  She also takes potassium supplementation   Plan:  She will have annual Medicare wellness visit in December by telephone and will be seen here in January 2024.

## 2021-11-19 LAB — TSH: TSH: 0.18 mIU/L — ABNORMAL LOW (ref 0.40–4.50)

## 2021-12-02 ENCOUNTER — Other Ambulatory Visit: Payer: Self-pay | Admitting: Internal Medicine

## 2021-12-05 DIAGNOSIS — Z23 Encounter for immunization: Secondary | ICD-10-CM | POA: Diagnosis not present

## 2022-01-02 ENCOUNTER — Encounter: Payer: Self-pay | Admitting: Internal Medicine

## 2022-01-15 DIAGNOSIS — Z23 Encounter for immunization: Secondary | ICD-10-CM | POA: Diagnosis not present

## 2022-02-26 ENCOUNTER — Ambulatory Visit (INDEPENDENT_AMBULATORY_CARE_PROVIDER_SITE_OTHER): Payer: Medicare HMO

## 2022-02-26 DIAGNOSIS — Z0000 Encounter for general adult medical examination without abnormal findings: Secondary | ICD-10-CM | POA: Diagnosis not present

## 2022-02-26 NOTE — Progress Notes (Signed)
Subjective:   [name] is a 81 y.o. female who presents for Medicare Annual (Subsequent) preventive examination.  Review of Systems    AWV       Objective:    There were no vitals filed for this visit. There is no height or weight on file to calculate BMI.     02/22/2021   10:12 AM 01/26/2017    3:41 PM 01/22/2017   10:43 AM 12/11/2016    8:45 AM 08/01/2016    8:14 AM 07/16/2015    4:20 PM 07/09/2015   10:31 AM  Advanced Directives  Does Patient Have a Medical Advance Directive? _0  No No  Would patient like information on creating a medical advance directive? Yes (MAU/Ambulatory/Procedural Areas - Information given) No - Patient declined No - Patient declined  Yes (MAU/Ambulatory/Procedural Areas - Information given) No - patient declined information No - patient declined information    Current Medications (verified) Outpatient Encounter Medications as of 02/26/2022  Medication Sig   amLODipine (NORVASC) 5 MG tablet TAKE 1 TABLET BY MOUTH EVERY DAY   cyclobenzaprine (FLEXERIL) 10 MG tablet Take 1 tablet (10 mg total) by mouth 3 (three) times daily as needed for muscle spasms.   levothyroxine (SYNTHROID) 100 MCG tablet Take 1 tablet (100 mcg total) by mouth daily.   meloxicam (MOBIC) 15 MG tablet TAKE 1 TABLET BY MOUTH ONCE DAILY   metoprolol succinate (TOPROL-XL) 25 MG 24 hr tablet TAKE 1 TABLET BY MOUTH EVERY DAY   potassium chloride (KLOR-CON) 10 MEQ tablet TAKE 1 TABLET BY MOUTH ONCE DAILY   simvastatin (ZOCOR) 20 MG tablet TAKE 1 TABLET BY MOUTH AT BEDTIME   torsemide (DEMADEX) 20 MG tablet TAKE 1 TABLET BY MOUTH DAILY   No facility-administered encounter medications on file as of 02/26/2022.    Allergies (verified) Prinivil [lisinopril]   History: Past Medical History:  Diagnosis Date   Anxiety    Arthritis    ostearthritis. Degenerative disc disease- back, knees, hands   Back pain    Cataract 11/2016   hx - surgery to remove   Hyperlipidemia     Hypertension    Hypothyroidism    Pre-diabetes    Renal insufficiency    Renal insufficiency    Rotator cuff disorder    Pain with limited ROM right, spur left shoulder   Thyroid disease    Past Surgical History:  Procedure Laterality Date   CATARACT EXTRACTION, BILATERAL Bilateral 11/2016   INJECTION KNEE Right 07/16/2015   Procedure: RIGHT KNEE INJECTION;  Surgeon: Gaynelle Arabian, MD;  Location: [facility];  Service: Orthopedics;  Laterality: Right;   JOINT REPLACEMENT Right 10/2008   RTHA-hip   REVERSE SHOULDER ARTHROPLASTY Left 08/15/2016   Procedure: REVERSE LEFT SHOULDER ARTHROPLASTY;  Surgeon: Netta Cedars, MD;  Location: [facility];  Service: Orthopedics;  Laterality: Left;   TOTAL KNEE ARTHROPLASTY Left 07/16/2015   Procedure: LEFT TOTAL KNEE ARTHROPLASTY;  Surgeon: Gaynelle Arabian, MD;  Location: [facility];  Service: Orthopedics;  Laterality: Left;   TOTAL KNEE ARTHROPLASTY Right 01/26/2017   Procedure: RIGHT TOTAL KNEE ARTHROPLASTY;  Surgeon: Gaynelle Arabian, MD;  Location: [facility];  Service: Orthopedics;  Laterality: Right;   Family History  Problem Relation Age of Onset   Hypertension Mother    Stroke Mother    Cancer Father        Prostate cancer   Cancer Brother        Lung cancer   Hypertension Sister  Hypertension Child    Diabetes Child    Colon cancer Neg Hx    Esophageal cancer Neg Hx    Stomach cancer Neg Hx    Pancreatic cancer Neg Hx    Social History   Socioeconomic History   Marital status: Married    Spouse name: Not on file   Number of children: Not on file   Years of education: Not on file   Highest education level: Not on file  Occupational History   Not on file  Tobacco Use   Smoking status: Never   Smokeless tobacco: Never  Vaping Use   Vaping Use: Never used  Substance and Sexual Activity   Alcohol use: No   Drug use: No   Sexual activity: Not Currently    Birth control/protection: Post-menopausal  Other Topics Concern   Not on file  Social  History Narrative   Not on file   Social Determinants of Health   Financial Resource Strain: Not on file  Food Insecurity: Not on file  Transportation Needs: Not on file  Physical Activity: Not on file  Stress: Not on file  Social Connections: Not on file    Tobacco Counseling Counseling given: Not Answered   Clinical Intake:                 Diabetic? No         Activities of Daily Living     No data to display           Patient Care Team: Baxley, Cresenciano Lick, MD as PCP - General (Internal Medicine)  Indicate any recent Medical Services you may have received from other than [facility] providers in the past year (date may be approximate).     Assessment:   This is a routine wellness examination for [name].  Hearing/Vision screen No results found.  Dietary issues and exercise activities discussed:     Goals Addressed   None   Depression Screen    02/22/2021   10:12 AM 08/20/2020    9:52 AM 02/17/2019    9:59 AM 01/04/2018   10:24 AM 01/02/2017   10:13 AM 07/03/2014   10:48 AM 12/16/2013   10:47 AM  PHQ 2/9 Scores  PHQ - 2 Score 0 0 0 0 0 0 0    Fall Risk    02/22/2021   10:12 AM 08/20/2020    9:52 AM 02/17/2019    9:59 AM 02/02/2019    5:34 PM 11/06/2018    4:19 PM  Fall Risk   Falls in the past year? 0 0 0 0 0  Comment    Emmi Telephone Survey: data to providers prior to load   Number falls in past yr: 0 0   0  Injury with Fall? 0 0     Risk for fall due to : Impaired balance/gait      Follow up Falls evaluation completed Falls evaluation completed   Falls evaluation completed;Education provided;Falls prevention discussed    FALL RISK PREVENTION PERTAINING TO THE HOME:  Any stairs in or around the home? yes If so, are there any without handrails?  yes Home free of loose throw rugs in walkways, pet beds, electrical cords, etc? Yes  Adequate lighting in your home to reduce risk of falls? Yes   ASSISTIVE DEVICES UTILIZED TO PREVENT  FALLS:  Life alert? No  Use of a cane, walker or w/c? sometimes Grab bars in the bathroom? No  Shower chair or bench in shower?  No  Elevated toilet seat or a handicapped toilet? No   TIMED UP AND GO:  Was the test performed?  N/A .  Length of time to ambulate 10 feet: N/A sec.   Gait slow and steady without use of assistive device  Cognitive Function:        02/22/2021   10:14 AM  6CIT Screen  What Year? 0 points  What month? 0 points  What time? 0 points  Count back from 20 0 points  Months in reverse 4 points  Repeat phrase 0 points  Total Score 4 points    Immunizations Immunization History  Administered Date(s) Administered   Influenza Inj Mdck Quad Pf 12/28/2018   Influenza,inj,Quad PF,6+ Mos 12/01/2014, 12/24/2015, 01/02/2017, 01/04/2018   Influenza-Unspecified 12/30/2019, 12/08/2020   PFIZER(Purple Top)SARS-COV-2 Vaccination 04/01/2019, 04/22/2019, 12/08/2019, 06/13/2020   Pfizer Covid-19 Vaccine Bivalent Booster 86yr & up 12/08/2020   Pneumococcal Conjugate-13 07/03/2014   Pneumococcal Polysaccharide-23 12/01/2011   Respiratory Syncytial Virus Vaccine,Recomb Aduvanted(Arexvy) 10/16/2021   Tdap 10/21/2010, 05/24/2021   Zoster Recombinat (Shingrix) 05/24/2021   Zoster, Unspecified 09/21/2021    TDAP status: Up to date  Flu Vaccine status: Up to date  Pneumococcal vaccine status: Due, Education has been provided regarding the importance of this vaccine. Advised may receive this vaccine at local pharmacy or Health Dept. Aware to provide a copy of the vaccination record if obtained from local pharmacy or Health Dept. Verbalized acceptance and understanding.  Covid-19 vaccine status: Information provided on how to obtain vaccines.   Qualifies for Shingles Vaccine? Yes   Zostavax completed Yes   Shingrix Completed?: Yes  Screening Tests Health Maintenance  Topic Date Due   OPHTHALMOLOGY EXAM  06/04/2019   FOOT EXAM  09/30/2019   Diabetic kidney  evaluation - Urine ACR  08/20/2021   COLONOSCOPY (Pts 45-464yrInsurance coverage will need to be confirmed)  12/18/2021   HEMOGLOBIN A1C  05/19/2022 (Originally 02/19/2021)   DEXA SCAN  09/20/2022 (Originally 05/07/2005)   Diabetic kidney evaluation - eGFR measurement  09/20/2022   Medicare Annual Wellness (AWV)  02/27/2023   DTaP/Tdap/Td (3 - Td or Tdap) 05/25/2031   Pneumonia Vaccine 6535Years old  Completed   INFLUENZA VACCINE  Completed   Zoster Vaccines- Shingrix  Completed   HPV VACCINES  Aged Out   COVID-19 Vaccine  Discontinued    Health Maintenance  Health Maintenance Due  Topic Date Due   OPHTHALMOLOGY EXAM  06/04/2019   FOOT EXAM  09/30/2019   Diabetic kidney evaluation - Urine ACR  08/20/2021   COLONOSCOPY (Pts 45-496yrnsurance coverage will need to be confirmed)  12/18/2021    Colorectal cancer screening: Type of screening: Colonoscopy. Completed 12/18/16. Repeat every 5 years  Mammogram status: No longer required due to TBD.    Lung Cancer Screening: (Low Dose CT Chest recommended if Age 25-83-80ars, 30 pack-year currently smoking OR have quit w/in 15years.) does not qualify.   Lung Cancer Screening Referral: N/A  Additional Screening:  Hepatitis C Screening: does not qualify; Completed N/A  Vision Screening: Recommended annual ophthalmology exams for early detection of glaucoma and other disorders of the eye. Is the patient up to date with their annual eye exam?  No  Who is the provider or what is the name of the office in which the patient attends annual eye exams? Yes If pt is not established with a provider, would they like to be referred to a provider to establish care? No .   Dental Screening:  Recommended annual dental exams for proper oral hygiene  Community Resource Referral / Chronic Care Management: CRR required this visit?  No   CCM required this visit?  No      Plan:     I have personally reviewed and noted the following in the  patient's chart:   Medical and social history Use of alcohol, tobacco or illicit drugs  Current medications and supplements including opioid prescriptions. Patient is not currently taking opioid prescriptions. Functional ability and status Nutritional status Physical activity Advanced directives List of other physicians Hospitalizations, surgeries, and ER visits in previous 12 months Vitals Screenings to include cognitive, depression, and falls Referrals and appointments  In addition, I have reviewed and discussed with patient certain preventive protocols, quality metrics, and best practice recommendations. A written personalized care plan for preventive services as well as general preventive health recommendations were provided to patient.     Pearlean Brownie, [place]   02/26/2022   Nurse Notes:

## 2022-02-27 ENCOUNTER — Other Ambulatory Visit: Payer: Self-pay | Admitting: Internal Medicine

## 2022-03-28 ENCOUNTER — Ambulatory Visit: Payer: Medicare HMO | Admitting: Internal Medicine

## 2022-04-11 ENCOUNTER — Ambulatory Visit: Payer: Medicare HMO | Admitting: Internal Medicine

## 2022-04-16 NOTE — Progress Notes (Signed)
Patient Care Team: Elby Showers, MD as PCP - General (Internal Medicine)  Visit Date: 04/18/22  Subjective:    Patient ID: [name] , Female   DOB: March 09, 1941, 82 y.o.    MRN: [id number]   81 y.o. Female presents today for a 6 month follow-up. Patient has a past medical history of hyperlipidemia, hypertension, hypothyroidism, pre-diabetes, renal insufficiency, back pain, cataract, arthritis, anxiety.  Reports bump in her mouth on the right side.  History of hypertension treated with Norvasc 5 mg daily, Toprol-XL 25 mg daily. Blood pressure at 130/80 today.  History of hyperlipidemia treated with Zocor 20 mg daily at bedtime. LDL elevated at 120 on 09/19/21.  History of hyperkalemia treated with Klor-Con 10 MEQ daily.  History of edema tretaed with Demadex 20 mg daily.  History of arthritis. No longer taking Mobic 15 mg daily due to associated rectal bleeding. She is not interested in a steroid injection and would like to avoid using pain medications.  History of hypothyroidism treated with Synthroid 75 mcg daily before breakfast.  History of muscle spasms treated with Flexeril 10 mg three times daily as needed. Reports that this makes her sleepy.  Colonoscopy last completed 12/18/2016 by Dr. Zenovia Jarred. Results showed one 4 mm polyp in cecum, removed. One 3 mm polyp in transverse colon, removed. Mild diverticulosis in sigmoid colon and descending colon, internal hemorrhoids. No recommendation for repeat at this time.   Past Medical History:  Diagnosis Date   Anxiety    Arthritis    ostearthritis. Degenerative disc disease- back, knees, hands   Back pain    Cataract 11/2016   hx - surgery to remove   Hyperlipidemia    Hypertension    Hypothyroidism    Pre-diabetes    Renal insufficiency    Renal insufficiency    Rotator cuff disorder    Pain with limited ROM right, spur left shoulder   Thyroid disease      Family History  Problem Relation Age of Onset    Hypertension Mother    Stroke Mother    Cancer Father        Prostate cancer   Cancer Brother        Lung cancer   Hypertension Sister    Hypertension Child    Diabetes Child    Colon cancer Neg Hx    Esophageal cancer Neg Hx    Stomach cancer Neg Hx    Pancreatic cancer Neg Hx     Social History   Social History Narrative   Not on file      Review of Systems  Constitutional:  Negative for fever and malaise/fatigue.  HENT:  Negative for congestion.        (+) Bump on right side of mouth  Eyes:  Negative for blurred vision.  Respiratory:  Negative for cough and shortness of breath.   Cardiovascular:  Negative for chest pain, palpitations and leg swelling.  Gastrointestinal:  Negative for vomiting.  Musculoskeletal:  Positive for joint pain. Negative for back pain.  Skin:  Negative for rash.  Neurological:  Negative for loss of consciousness and headaches.        Objective:   Vitals: BP 130/80   Pulse 70   Temp 98.7 F (37.1 C) (Tympanic)   Ht 5' 2.25" (1.581 m)   Wt 228 lb 12.8 oz (103.8 kg)   SpO2 98%   BMI 41.51 kg/m    Physical Exam Vitals and nursing note reviewed.  Constitutional:      General: She is not in acute distress.    Appearance: Normal appearance. She is not toxic-appearing.  HENT:     Head: Normocephalic and atraumatic.     Mouth/Throat:     Comments: Superficial ulceration right buccal mucosa. Neck:     Thyroid: No thyroid mass, thyromegaly or thyroid tenderness.     Vascular: No carotid bruit.  Cardiovascular:     Rate and Rhythm: Normal rate and regular rhythm. No extrasystoles are present.    Heart sounds: Normal heart sounds. No murmur heard.    No gallop.  Pulmonary:     Effort: Pulmonary effort is normal. No respiratory distress.     Breath sounds: Normal breath sounds. No wheezing or rales.  Musculoskeletal:     Right lower leg: 1+ Edema present.     Left lower leg: 1+ Edema present.  Lymphadenopathy:     Cervical: No  cervical adenopathy.  Skin:    General: Skin is warm and dry.  Neurological:     Mental Status: She is alert and oriented to person, place, and time. Mental status is at baseline.  Psychiatric:        Mood and Affect: Mood normal.        Behavior: Behavior normal.        Thought Content: Thought content normal.        Judgment: Judgment normal.       Results:   Studies obtained and personally reviewed by me:  Colonoscopy last completed 12/18/2016 by Dr. Zenovia Jarred. Results showed one 4 mm polyp in cecum, removed. One 3 mm polyp in transverse colon, removed. Mild diverticulosis in sigmoid colon and descending colon, internal hemorrhoids. No recommendation for repeat at this time, not indicated.  Labs:       Component Value Date/Time   NA 140 09/19/2021 1105   K 4.8 09/19/2021 1105   CL 105 09/19/2021 1105   CO2 28 09/19/2021 1105   GLUCOSE 80 09/19/2021 1105   BUN 11 09/19/2021 1105   CREATININE 1.28 (H) 09/19/2021 1105   CALCIUM 9.4 09/19/2021 1105   PROT 7.7 09/19/2021 1105   PROT 7.7 09/19/2021 1105   ALBUMIN 3.8 01/22/2017 1123   AST 20 09/19/2021 1105   AST 20 09/19/2021 1105   ALT 9 09/19/2021 1105   ALT 9 09/19/2021 1105   ALKPHOS 71 01/22/2017 1123   BILITOT 0.6 09/19/2021 1105   BILITOT 0.6 09/19/2021 1105   GFRNONAA 38 (L) 08/20/2020 0000   GFRAA 44 (L) 08/20/2020 0000     Lab Results  Component Value Date   WBC 4.0 02/22/2021   HGB 12.2 02/22/2021   HCT 36.2 02/22/2021   MCV 95.3 02/22/2021   PLT 239 02/22/2021    Lab Results  Component Value Date   CHOL 199 09/19/2021   HDL 59 09/19/2021   LDLCALC 120 (H) 09/19/2021   TRIG 98 09/19/2021   CHOLHDL 3.4 09/19/2021    Lab Results  Component Value Date   HGBA1C 5.6 08/20/2020     Lab Results  Component Value Date   TSH 0.18 (L) 11/18/2021      Assessment & Plan:   Superficial ulceration right buccal mucosa: Prescribed [employer] Magic Mouth wash. Advised to contact dentist if no  improvement.  Hypertension: Well-controlled with Norvasc 5 mg daily, Toprol-XL 25 mg daily. Blood pressure at 130/80 today.  Hyperlipidemia: Well-controlled with Zocor 20 mg daily at bedtime. LDL elevated at 120 on 09/19/21.  Hypokalemia: Well-controlled with Klor-Con 10 MEQ daily.  Edema: Well-controlled with Demadex 20 mg daily.  Arthritis: Stopped taking Mobic 15 mg daily due to associated rectal bleeding. She is not interested in a steroid injection and would like to avoid using pain medications.  Hypothyroidism: Well-controlled with Synthroid 75 mcg daily before breakfast.  Muscle Spasms: Stop Flexeril due to drowsiness felt after taking. Prescribed Relafen 500 mg twice daily, morning and bedtime.  Colonoscopy: Last completed 12/18/2016 by Dr. Zenovia Jarred. Results showed one 4 mm polyp in cecum, removed. One 3 mm polyp in transverse colon, removed. Mild diverticulosis in sigmoid colon and descending colon, internal hemorrhoids. No recommendation for repeat at this time, not indicated.  Health Maintenance: CBC with Diff/Plat, CMET with GFR, TSH, A1C, lipid panel, microalbumin/creatinine urine ratio ordered. Reports she has been fasting this morning.    I,Alexander Ruley,acting as a Education administrator for Elby Showers, MD.,have documented all relevant documentation on the behalf of Elby Showers, MD,as directed by  Elby Showers, MD while in the presence of Elby Showers, MD.  I, Elby Showers, MD, have reviewed all documentation for this visit. The documentation on 04/21/22 for the exam, diagnosis, procedures, and orders are all accurate and complete.

## 2022-04-18 ENCOUNTER — Ambulatory Visit (INDEPENDENT_AMBULATORY_CARE_PROVIDER_SITE_OTHER): Payer: Medicare HMO | Admitting: Internal Medicine

## 2022-04-18 ENCOUNTER — Encounter: Payer: Self-pay | Admitting: Internal Medicine

## 2022-04-18 ENCOUNTER — Other Ambulatory Visit: Payer: Self-pay

## 2022-04-18 VITALS — BP 130/80 | HR 70 | Temp 98.7°F | Ht 62.25 in | Wt 228.8 lb

## 2022-04-18 DIAGNOSIS — K120 Recurrent oral aphthae: Secondary | ICD-10-CM | POA: Diagnosis not present

## 2022-04-18 DIAGNOSIS — Z6841 Body Mass Index (BMI) 40.0 and over, adult: Secondary | ICD-10-CM | POA: Diagnosis not present

## 2022-04-18 DIAGNOSIS — M158 Other polyosteoarthritis: Secondary | ICD-10-CM

## 2022-04-18 DIAGNOSIS — R82998 Other abnormal findings in urine: Secondary | ICD-10-CM

## 2022-04-18 DIAGNOSIS — Z0189 Encounter for other specified special examinations: Secondary | ICD-10-CM

## 2022-04-18 DIAGNOSIS — E039 Hypothyroidism, unspecified: Secondary | ICD-10-CM

## 2022-04-18 DIAGNOSIS — E782 Mixed hyperlipidemia: Secondary | ICD-10-CM | POA: Diagnosis not present

## 2022-04-18 DIAGNOSIS — G8929 Other chronic pain: Secondary | ICD-10-CM

## 2022-04-18 DIAGNOSIS — N1831 Chronic kidney disease, stage 3a: Secondary | ICD-10-CM

## 2022-04-18 DIAGNOSIS — Z96612 Presence of left artificial shoulder joint: Secondary | ICD-10-CM

## 2022-04-18 DIAGNOSIS — R7302 Impaired glucose tolerance (oral): Secondary | ICD-10-CM

## 2022-04-18 DIAGNOSIS — Z96652 Presence of left artificial knee joint: Secondary | ICD-10-CM

## 2022-04-18 DIAGNOSIS — Z96641 Presence of right artificial hip joint: Secondary | ICD-10-CM

## 2022-04-18 DIAGNOSIS — I10 Essential (primary) hypertension: Secondary | ICD-10-CM

## 2022-04-18 DIAGNOSIS — Z96651 Presence of right artificial knee joint: Secondary | ICD-10-CM

## 2022-04-18 DIAGNOSIS — M7918 Myalgia, other site: Secondary | ICD-10-CM

## 2022-04-18 LAB — POCT URINALYSIS DIPSTICK
Bilirubin, UA: NEGATIVE
Glucose, UA: NEGATIVE
Ketones, UA: NEGATIVE
Nitrite, UA: NEGATIVE
Protein, UA: NEGATIVE
Spec Grav, UA: 1.01 (ref 1.010–1.025)
Urobilinogen, UA: 0.2 E.U./dL
pH, UA: 5 (ref 5.0–8.0)

## 2022-04-18 MED ORDER — NABUMETONE 500 MG PO TABS: 500.0000 mg | ORAL_TABLET | Freq: Two times a day (BID) | ORAL | 2 refills | Status: DC

## 2022-04-18 MED ORDER — HYDROCODONE-ACETAMINOPHEN 10-325 MG PO TABS: 1.0000 | ORAL_TABLET | Freq: Three times a day (TID) | ORAL | 0 refills | Status: AC | PRN

## 2022-04-18 NOTE — Patient Instructions (Addendum)
Labs drawn and pending. Hydrocodone APAP refilled for osteoarthritis pain. Try Relafen 500 mg twice daily for osteoarthritis pain. Magic Mouthwash for apthous ulcer. RTC in 6 months.

## 2022-04-19 LAB — LIPID PANEL
Cholesterol: 156 mg/dL (ref <200)
HDL: 56 mg/dL (ref >=50)
LDL Cholesterol (Calc): 82 mg/dL (calc)
Non-HDL Cholesterol (Calc): 100 mg/dL (calc) (ref <130)
Total CHOL/HDL Ratio: 2.8 (calc) (ref <5.0)
Triglycerides: 88 mg/dL (ref <150)

## 2022-04-19 LAB — TSH: TSH: 2.8 mIU/L (ref 0.40–4.50)

## 2022-04-19 LAB — CBC WITH DIFFERENTIAL/PLATELET
Absolute Monocytes: 330 cells/uL (ref 200–950)
Basophils Absolute: 20 cells/uL (ref 0–200)
Basophils Relative: 0.6 %
Eosinophils Absolute: 78 cells/uL (ref 15–500)
Eosinophils Relative: 2.3 %
HCT: 38 % (ref 35.0–45.0)
Hemoglobin: 12.9 g/dL (ref 11.7–15.5)
Lymphs Abs: 1527 cells/uL (ref 850–3900)
MCH: 31.8 pg (ref 27.0–33.0)
MCHC: 33.9 g/dL (ref 32.0–36.0)
MCV: 93.6 fL (ref 80.0–100.0)
MPV: 10.4 fL (ref 7.5–12.5)
Monocytes Relative: 9.7 %
Neutro Abs: 1445 cells/uL — ABNORMAL LOW (ref 1500–7800)
Neutrophils Relative %: 42.5 %
Platelets: 230 10*3/uL (ref 140–400)
RBC: 4.06 10*6/uL (ref 3.80–5.10)
RDW: 12.2 % (ref 11.0–15.0)
Total Lymphocyte: 44.9 %
WBC: 3.4 10*3/uL — ABNORMAL LOW (ref 3.8–10.8)

## 2022-04-19 LAB — URINE CULTURE
MICRO NUMBER:: 14544658
SPECIMEN QUALITY:: ADEQUATE

## 2022-04-19 LAB — MICROALBUMIN / CREATININE URINE RATIO
Creatinine, Urine: 103 mg/dL (ref 20–275)
Microalb Creat Ratio: 3 mcg/mg creat (ref <30)
Microalb, Ur: 0.3 mg/dL

## 2022-04-19 LAB — COMPLETE METABOLIC PANEL WITH GFR
AG Ratio: 1 (calc) (ref 1.0–2.5)
ALT: 10 U/L (ref 6–29)
AST: 20 U/L (ref 10–35)
Albumin: 4 g/dL (ref 3.6–5.1)
Alkaline phosphatase (APISO): 62 U/L (ref 37–153)
BUN/Creatinine Ratio: 13 (calc) (ref 6–22)
BUN: 18 mg/dL (ref 7–25)
CO2: 27 mmol/L (ref 20–32)
Calcium: 9.7 mg/dL (ref 8.6–10.4)
Chloride: 103 mmol/L (ref 98–110)
Creat: 1.37 mg/dL — ABNORMAL HIGH (ref 0.60–0.95)
Globulin: 4 g/dL (calc) — ABNORMAL HIGH (ref 1.9–3.7)
Glucose, Bld: 93 mg/dL (ref 65–99)
Potassium: 4.7 mmol/L (ref 3.5–5.3)
Sodium: 141 mmol/L (ref 135–146)
Total Bilirubin: 0.5 mg/dL (ref 0.2–1.2)
Total Protein: 8 g/dL (ref 6.1–8.1)
eGFR: 39 mL/min/{1.73_m2} — ABNORMAL LOW (ref >=60)

## 2022-04-19 LAB — HEMOGLOBIN A1C
Hgb A1c MFr Bld: 5.9 % of total Hgb — ABNORMAL HIGH (ref <5.7)
Mean Plasma Glucose: 123 mg/dL
eAG (mmol/L): 6.8 mmol/L

## 2022-06-03 ENCOUNTER — Other Ambulatory Visit: Payer: Self-pay | Admitting: Internal Medicine

## 2022-10-17 NOTE — Progress Notes (Addendum)
Patient Care Team: Margaree Mackintosh, MD as PCP - General (Internal Medicine)  Visit Date: 10/24/22  Subjective:    Patient ID: [name] , Female   DOB: May 05, 1940, 82 y.o.    MRN: [id number]   82 y.o. Female presents today for a 6 month follow-up. History of anxiety, hyperlipidemia, hypertension, hypothyroidism, impaired glucose tolerance, renal insufficiency, arthritis, cataract, intertrigo.  She has been having head congestion, cough, chills since 10/19/22. Denies sputum production, nausea, diarrhea. No known sick contact.  Impaired glucose tolerance is diet-controlled at the present time.  History of hypertension treated with amlodipine 5 mg daily, metoprolol succinate 25 mg daily. Blood pressure normal today at 160/90.  History of hyperlipidemia treated with simvastatin 20 mg at bedtime.  History of hypothyroidism treated with levothyroxine 100 mcg daily.  History of edema treated with torsemide 20 mg daily.  She has chronic kidney disease stage IIIa which is being observed and is stable.   History of osteoarthritis right shoulder.  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  She had left shoulder arthroplasty by Dr. Devonne Doughty in October 2018.  Right hip arthroplasty in 2018. She has been having hip pain and would like something to help with sleep. Musculoskeletal pain treated with meloxicam 15 mg daily. Does not tolerate nabumetone due to eye irritation.  No history of serious illnesses or accidents.  She will return for labs.  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 Resides alone. Does not smoke or consume alcohol. Completed high school. She does her own housework and owns her own home. Daughter is a Engineer, civil (consulting) and works with [employer] here in [place]. Patient has grandchildren and great-grandchildren.  Family history: Father died at age 92 of prostate  cancer. Mother died at age 27 of a stroke with history of hypertension. 1 brother with history of lung cancer.   Past Medical History:  Diagnosis Date   Anxiety    Arthritis    ostearthritis. Degenerative disc disease- back, knees, hands   Back pain    Cataract 11/2016   hx - surgery to remove   Hyperlipidemia    Hypertension    Hypothyroidism    Pre-diabetes    Renal insufficiency    Renal insufficiency    Rotator cuff disorder    Pain with limited ROM right, spur left shoulder   Thyroid disease      Family History  Problem Relation Age of Onset   Hypertension Mother    Stroke Mother    Cancer Father        Prostate cancer   Cancer Brother        Lung cancer   Hypertension Sister    Hypertension Child    Diabetes Child    Colon cancer Neg Hx    Esophageal cancer Neg Hx    Stomach cancer Neg Hx    Pancreatic cancer Neg Hx     Social History   Social History Narrative   Not on file      Review of Systems  Constitutional:  Positive for chills. Negative for fever and malaise/fatigue.  HENT:  Positive for congestion (Head).   Eyes:  Negative for blurred vision.  Respiratory:  Positive for cough. Negative for sputum production and shortness of breath.   Cardiovascular:  Negative for chest pain, palpitations and leg swelling.  Gastrointestinal:  Negative for nausea and vomiting.  Musculoskeletal:  Negative for back pain.  Skin:  Negative for rash.  Neurological:  Negative for loss of consciousness and headaches.        Objective:   Vitals: BP (!) 160/90   Pulse 71   Temp 97.8 F (36.6 C) (Tympanic)   Ht 5' 2.25" (1.581 m)   Wt 220 lb (99.8 kg)   SpO2 97%   BMI 39.92 kg/m    Physical Exam Vitals and nursing note reviewed.  Constitutional:      General: She is not in acute distress.    Appearance: Normal appearance. She is not toxic-appearing.  HENT:     Head: Normocephalic and atraumatic.  Cardiovascular:     Rate and Rhythm: Normal rate and  regular rhythm. No extrasystoles are present.    Pulses: Normal pulses.     Heart sounds: Normal heart sounds. No murmur heard.    No friction rub. No gallop.  Pulmonary:     Effort: Pulmonary effort is normal. No respiratory distress.     Breath sounds: Normal breath sounds. No wheezing or rales.  Musculoskeletal:     Right lower leg: 1+ Edema present.     Left lower leg: 1+ Edema present.  Skin:    General: Skin is warm and dry.  Neurological:     Mental Status: She is alert and oriented to person, place, and time. Mental status is at baseline.  Psychiatric:        Mood and Affect: Mood normal.        Behavior: Behavior normal.        Thought Content: Thought content normal.        Judgment: Judgment normal.       Results:   Studies obtained and personally reviewed by me:   Labs:       Component Value Date/Time   NA 141 04/18/2022 1002   K 4.7 04/18/2022 1002   CL 103 04/18/2022 1002   CO2 27 04/18/2022 1002   GLUCOSE 93 04/18/2022 1002   BUN 18 04/18/2022 1002   CREATININE 1.37 (H) 04/18/2022 1002   CALCIUM 9.7 04/18/2022 1002   PROT 8.0 04/18/2022 1002   ALBUMIN 3.8 01/22/2017 1123   AST 20 04/18/2022 1002   ALT 10 04/18/2022 1002   ALKPHOS 71 01/22/2017 1123   BILITOT 0.5 04/18/2022 1002   GFRNONAA 38 (L) 08/20/2020 0000   GFRAA 44 (L) 08/20/2020 0000     Lab Results  Component Value Date   WBC 3.4 (L) 04/18/2022   HGB 12.9 04/18/2022   HCT 38.0 04/18/2022   MCV 93.6 04/18/2022   PLT 230 04/18/2022    Lab Results  Component Value Date   CHOL 156 04/18/2022   HDL 56 04/18/2022   LDLCALC 82 04/18/2022   TRIG 88 04/18/2022   CHOLHDL 2.8 04/18/2022    Lab Results  Component Value Date   HGBA1C 5.9 (H) 04/18/2022     Lab Results  Component Value Date   TSH 2.80 04/18/2022      Assessment & Plan:   Acute Covid-19 infection: prescribed Zithromax Z-Pak two tabs day 1 followed by one tab days 2-5. Contact us if symptoms worsen or fail to  improve.  Impaired glucose tolerance is diet-controlled at the present time.  Hypertension: treated with amlodipine 5 mg daily, metoprolol succinate 25 mg daily. Blood pressure normal today at 160/90.  Hyperlipidemia: treated with simvastatin 20 mg at bedtime.  Hypothyroidism: treated with levothyroxine 100 mcg daily.  Edema:  treated with torsemide 20 mg daily.  Chronic kidney disease stage IIIb: monitored and is stable.   Osteoarthritis: treated with meloxicam 15 mg daily. Prescribed Norco 10-325 mg one half to one tablet every 8 hours as needed for pain. Prescribed cyclobenzaprine 10 mg as needed at her request for musculoskeletal pain.  She will return for labs at a later date when she has recovered from her Covid-19 infection.We can do her 6 month labs in a few weeks and see her for CPE in 6 months.    I,Alexander Ruley,acting as a Neurosurgeon for Margaree Mackintosh, MD.,have documented all relevant documentation on the behalf of Margaree Mackintosh, MD,as directed by  Margaree Mackintosh, MD while in the presence of Margaree Mackintosh, MD.   I, Margaree Mackintosh, MD, have reviewed all documentation for this visit. The documentation on 10/24/22 for the exam, diagnosis, procedures, and orders are all accurate and complete.

## 2022-10-24 ENCOUNTER — Encounter: Payer: Self-pay | Admitting: Internal Medicine

## 2022-10-24 ENCOUNTER — Ambulatory Visit (INDEPENDENT_AMBULATORY_CARE_PROVIDER_SITE_OTHER): Payer: Medicare HMO | Admitting: Internal Medicine

## 2022-10-24 VITALS — BP 160/90 | HR 71 | Temp 97.8°F | Ht 62.25 in | Wt 220.0 lb

## 2022-10-24 DIAGNOSIS — I10 Essential (primary) hypertension: Secondary | ICD-10-CM

## 2022-10-24 DIAGNOSIS — N1831 Chronic kidney disease, stage 3a: Secondary | ICD-10-CM | POA: Diagnosis not present

## 2022-10-24 DIAGNOSIS — R030 Elevated blood-pressure reading, without diagnosis of hypertension: Secondary | ICD-10-CM

## 2022-10-24 DIAGNOSIS — G8929 Other chronic pain: Secondary | ICD-10-CM

## 2022-10-24 DIAGNOSIS — E039 Hypothyroidism, unspecified: Secondary | ICD-10-CM | POA: Diagnosis not present

## 2022-10-24 DIAGNOSIS — R7302 Impaired glucose tolerance (oral): Secondary | ICD-10-CM

## 2022-10-24 DIAGNOSIS — Z96651 Presence of right artificial knee joint: Secondary | ICD-10-CM

## 2022-10-24 DIAGNOSIS — Z96612 Presence of left artificial shoulder joint: Secondary | ICD-10-CM

## 2022-10-24 DIAGNOSIS — R059 Cough, unspecified: Secondary | ICD-10-CM

## 2022-10-24 DIAGNOSIS — M7918 Myalgia, other site: Secondary | ICD-10-CM | POA: Diagnosis not present

## 2022-10-24 DIAGNOSIS — E782 Mixed hyperlipidemia: Secondary | ICD-10-CM

## 2022-10-24 DIAGNOSIS — U071 COVID-19: Secondary | ICD-10-CM

## 2022-10-24 DIAGNOSIS — Z96641 Presence of right artificial hip joint: Secondary | ICD-10-CM

## 2022-10-24 DIAGNOSIS — Z96652 Presence of left artificial knee joint: Secondary | ICD-10-CM

## 2022-10-24 LAB — POC COVID19 BINAXNOW: SARS Coronavirus 2 Ag: POSITIVE — AB

## 2022-10-24 MED ORDER — CYCLOBENZAPRINE HCL 10 MG PO TABS: 10.0000 mg | ORAL_TABLET | Freq: Every day | ORAL | 0 refills | Status: DC

## 2022-10-24 MED ORDER — AZITHROMYCIN 250 MG PO TABS: ORAL_TABLET | ORAL | 0 refills | Status: AC

## 2022-10-24 MED ORDER — CYCLOBENZAPRINE HCL 10 MG PO TABS: ORAL_TABLET | ORAL | 0 refills | Status: DC

## 2022-10-24 MED ORDER — HYDROCODONE-ACETAMINOPHEN 10-325 MG PO TABS: ORAL_TABLET | ORAL | 0 refills | Status: DC

## 2022-10-24 NOTE — Patient Instructions (Addendum)
Patient will be treated with Zithromax 2 tabs day 1 followed by 1 tab days 2 through 5.  Chronic musculoskeletal pain was given refill on hydrocodone APAP 10/325 to take sparingly 1/2 to 1 tablet every 8-12 hours as needed for musculoskeletal pain.  She is to quarantine for a total of 5 days since onset of symptoms as she has acute COVID-19.  She has already exposed a couple of family members today.  She thought she just had a cold.  Her blood pressure is elevated today in the office.  She will monitor it at home and let me know if it is not well-controlled.  We did not draw labs today because she had acute illness.  She will be scheduled at a later time for this.

## 2022-11-22 DIAGNOSIS — Z23 Encounter for immunization: Secondary | ICD-10-CM | POA: Diagnosis not present

## 2022-12-03 ENCOUNTER — Other Ambulatory Visit: Payer: Self-pay | Admitting: Internal Medicine

## 2022-12-17 DIAGNOSIS — Z23 Encounter for immunization: Secondary | ICD-10-CM | POA: Diagnosis not present

## 2022-12-18 NOTE — Progress Notes (Addendum)
Patient Care Team: Margaree Mackintosh, MD as PCP - General (Internal Medicine)  Visit Date: 12/25/22  Subjective:    Patient ID: [name] , Female   DOB: [birth year], 82 y.o.    MRN: [id number]   82 y.o. Female presents today for a 54-month follow-up. history of hyperlipidemia, hypertension, hypothyroidism,  glucose intolerance treated with diet,  back pain, cataract, arthritis, anxiety.  Having considerable pain left hip and will be seeing Dr. Despina Hick later today.   She has had continuing fatigue, intermittent cough since her Covid-19 infection in 8/24.  She has seen a dentist for her mouth ulcer and was given ointment.   History of hypertension treated with Norvasc 5 mg daily, Torsemide and Toprol-XL 25 mg daily. Blood pressure normal in-office today at 130/80. Denies chest pain, shortness of breath.   History of hyperlipidemia treated with Zocor 20 mg daily at bedtime.Lipid panel is normal.   History of hyperkalemia treated with Klor-Con 10 MEQ daily.Potassium was normal in February.   History of edema treated with Demadex 20 mg daily.  She has CKD Stage 3b likely due to HTN and mild glucose intolerance. Has been on NSAIDS for osteoarthritis for a number of years as well. This may have contributed to CKD as well.   History of osteoarthritis treated with nabumetone, Norco. Does not tolerate Mobic due to rectal bleeding. She is having left hip pain and is not gardening because of this. She walks with a cane.Takes Hydrocodone 10/325 sparingly and was given 5 day supply (#15 tabs) today   History of hypothyroidism treated with Synthroid 100 mcg daily before breakfast.   History of musculoskeletal pain treated with Flexeril 10 mg three times daily as needed.   Reminded of annual eye exam. She has not had an eye exam in 2023 or 2024.  Had left knee arthroplasty in 2017.  Right knee arthroplasty November 2018.  Left shoulder arthroplasty by Dr. Ranell Patrick October 2018.  Right hip  arthroplasty 2018.  No history of serious illnesses or accidents.   Colonoscopy last completed 12/18/2016 by Dr. Erick Blinks. Results showed one 4 mm polyp in cecum, removed. One 3 mm polyp in transverse colon, removed. Mild diverticulosis in sigmoid colon and descending colon, internal hemorrhoids. No recommendation for repeat at this time.  Past Medical History:  Diagnosis Date   Anxiety    Arthritis    ostearthritis. Degenerative disc disease- back, knees, hands   Back pain    Cataract 11/2016   hx - surgery to remove   Hyperlipidemia    Hypertension    Hypothyroidism    Pre-diabetes    Renal insufficiency    Renal insufficiency    Rotator cuff disorder    Pain with limited ROM right, spur left shoulder   Thyroid disease      Family History  Problem Relation Age of Onset   Hypertension Mother    Stroke Mother    Cancer Father        Prostate cancer   Cancer Brother        Lung cancer   Hypertension Sister    Hypertension Child    Diabetes Child    Colon cancer Neg Hx    Esophageal cancer Neg Hx    Stomach cancer Neg Hx    Pancreatic cancer Neg Hx     Social Hx: She resides alone in [place], [place]. Daughter who is a Engineer, civil (consulting) at [employer] resides here.  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  She does her own housework and her yard work.  She owns her own home.  Patient has grandchildren and great-grandchildren.  Non-smoker.  Does not consume alcohol.  Family history: Father died at age 66 of prostate cancer.  Mother died at age 24 of a stroke with history of hypertension.  1 brother with history of lung cancer.     Review of system: Having considerable pain with left hip.  Refilled hydrocodone APAP for 5-day course today.      Objective:   Vitals: BP 130/80   Pulse 96   Temp 98 F (36.7 C)   Ht 5' 2.25" (1.581 m)   Wt 209 lb (94.8 kg)   SpO2 98%   BMI 37.92 kg/m    Physical Exam Vitals and  nursing note reviewed.  Constitutional:      General: She is not in acute distress.    Appearance: Normal appearance. She is not toxic-appearing.  HENT:     Head: Normocephalic and atraumatic.  Neck:     Thyroid: No thyroid mass, thyromegaly or thyroid tenderness.     Vascular: No carotid bruit.  Cardiovascular:     Rate and Rhythm: Normal rate and regular rhythm. No extrasystoles are present.    Pulses: Normal pulses.     Heart sounds: Normal heart sounds. No murmur heard.    No friction rub. No gallop.  Pulmonary:     Effort: Pulmonary effort is normal. No respiratory distress.     Breath sounds: Normal breath sounds. No wheezing or rales.  Musculoskeletal:     Comments: Left straight leg raise test positive.  Lymphadenopathy:     Cervical: No cervical adenopathy.  Skin:    General: Skin is warm and dry.  Neurological:     Mental Status: She is alert and oriented to person, place, and time. Mental status is at baseline.  Psychiatric:        Mood and Affect: Mood normal.        Behavior: Behavior normal.        Thought Content: Thought content normal.        Judgment: Judgment normal.       Results:   Studies obtained and personally reviewed by me:  Colonoscopy last completed 12/18/2016 by Dr. Erick Blinks. Results showed one 4 mm polyp in cecum, removed. One 3 mm polyp in transverse colon, removed. Mild diverticulosis in sigmoid colon and descending colon, internal hemorrhoids. No recommendation for repeat at this time.  Labs:       Component Value Date/Time   NA 141 04/18/2022 1002   K 4.7 04/18/2022 1002   CL 103 04/18/2022 1002   CO2 27 04/18/2022 1002   GLUCOSE 93 04/18/2022 1002   BUN 18 04/18/2022 1002   CREATININE 1.37 (H) 04/18/2022 1002   CALCIUM 9.7 04/18/2022 1002   PROT 8.0 04/18/2022 1002   ALBUMIN 3.8 01/22/2017 1123   AST 20 04/18/2022 1002   ALT 10 04/18/2022 1002   ALKPHOS 71 01/22/2017 1123   BILITOT 0.5 04/18/2022 1002   GFRNONAA 38 (L)  08/20/2020 0000   GFRAA 44 (L) 08/20/2020 0000     Lab Results  Component Value Date   WBC 3.4 (L) 04/18/2022   HGB 12.9 04/18/2022   HCT 38.0 04/18/2022   MCV 93.6 04/18/2022   PLT 230 04/18/2022    Lab Results  Component Value Date   CHOL  156 04/18/2022   HDL 56 04/18/2022   LDLCALC 82 04/18/2022   TRIG 88 04/18/2022   CHOLHDL 2.8 04/18/2022    Lab Results  Component Value Date   HGBA1C 5.9 (H) 04/18/2022     Lab Results  Component Value Date   TSH 2.80 04/18/2022      Assessment & Plan:  Osteoarthritis left hip-going to see Dr. Despina Hick today.  Patient anticipates he will recommend hip arthroplasty.  Having considerable arthritis pain and hydrocodone APAP refilled for 5 days.  Has taken meloxicam for a long time to help with this as well.  Does have some chronic kidney disease stage IIIb  Fatigue: this is ongoing after Covid-19 infection in 8/24 she says  Chronic kidney disease stage IIIb continue to monitor.  Will consider discontinuing meloxicam after her hip arthroplasty.   Hypertension: treated with Norvasc 5 mg daily, torsemide, Toprol-XL 25 mg daily. Blood pressure normal in-office today at 130/80. Denies chest pain, shortness of breath.   Hyperlipidemia: treated with Zocor 20 mg daily at bedtime.  Lipid panel drawn today is within normal limits   Hypokalemia: treated with Klor-Con 10 MEQ daily.  Secondary to diuretic therapy.  Despite chronic kidney disease, tolerates potassium supplement.  Potassium is normal.   Edema: treated with torsemide 20 mg daily.   Osteoarthritis: treated with nabumetone 500 mg twice daily, Norco 10-325 one to one-half tablet every 8 hours as needed. Refilled Norco.    Hypothyroidism: treated with Synthroid 100 mcg daily before breakfast.  TSH is normal on levothyroxine 100 mcg daily   Muscle spasms: treated with Flexeril 10 mg three times daily as needed.   Using ointment for mouth ulcer.  Glucose intolerance-stable with diet  alone.  Hemoglobin A1c is 6%.  Bone density scan deferred.   Colonoscopy last completed 12/18/2016 by Dr. Erick Blinks. Results showed one 4 mm polyp in cecum, removed. One 3 mm polyp in transverse colon, removed. Mild diverticulosis in sigmoid colon and descending colon, internal hemorrhoids. No recommendation for repeat at this time.  Vaccine counseling: reports she is UTD on flu, Covid-19 boosters.  Return in 6 months for health maintenance exam or as needed.  Okay to proceed with hip arthroplasty.  Medical issues are stable.  Will need postop follow-up of chronic kidney disease with basic metabolic panel.    I,Alexander Ruley,acting as a Neurosurgeon for Margaree Mackintosh, MD.,have documented all relevant documentation on the behalf of Margaree Mackintosh, MD,as directed by  Margaree Mackintosh, MD while in the presence of Margaree Mackintosh, MD.   I, Margaree Mackintosh, MD, have reviewed all documentation for this visit. The documentation on 12/26/22 for the exam, diagnosis, procedures, and orders are all accurate and complete.

## 2022-12-25 ENCOUNTER — Encounter: Payer: Self-pay | Admitting: Internal Medicine

## 2022-12-25 ENCOUNTER — Ambulatory Visit (INDEPENDENT_AMBULATORY_CARE_PROVIDER_SITE_OTHER): Payer: Medicare HMO | Admitting: Internal Medicine

## 2022-12-25 VITALS — BP 130/80 | HR 96 | Temp 98.0°F | Ht 62.25 in | Wt 209.0 lb

## 2022-12-25 DIAGNOSIS — Z96652 Presence of left artificial knee joint: Secondary | ICD-10-CM

## 2022-12-25 DIAGNOSIS — Z96612 Presence of left artificial shoulder joint: Secondary | ICD-10-CM

## 2022-12-25 DIAGNOSIS — R609 Edema, unspecified: Secondary | ICD-10-CM

## 2022-12-25 DIAGNOSIS — E039 Hypothyroidism, unspecified: Secondary | ICD-10-CM

## 2022-12-25 DIAGNOSIS — M1612 Unilateral primary osteoarthritis, left hip: Secondary | ICD-10-CM | POA: Diagnosis not present

## 2022-12-25 DIAGNOSIS — Z96651 Presence of right artificial knee joint: Secondary | ICD-10-CM | POA: Diagnosis not present

## 2022-12-25 DIAGNOSIS — R7302 Impaired glucose tolerance (oral): Secondary | ICD-10-CM | POA: Diagnosis not present

## 2022-12-25 DIAGNOSIS — I10 Essential (primary) hypertension: Secondary | ICD-10-CM | POA: Diagnosis not present

## 2022-12-25 DIAGNOSIS — Z96641 Presence of right artificial hip joint: Secondary | ICD-10-CM | POA: Diagnosis not present

## 2022-12-25 DIAGNOSIS — M25552 Pain in left hip: Secondary | ICD-10-CM | POA: Diagnosis not present

## 2022-12-25 DIAGNOSIS — M158 Other polyosteoarthritis: Secondary | ICD-10-CM

## 2022-12-25 DIAGNOSIS — E7439 Other disorders of intestinal carbohydrate absorption: Secondary | ICD-10-CM

## 2022-12-25 DIAGNOSIS — E782 Mixed hyperlipidemia: Secondary | ICD-10-CM

## 2022-12-25 DIAGNOSIS — N1831 Chronic kidney disease, stage 3a: Secondary | ICD-10-CM

## 2022-12-25 MED ORDER — HYDROCODONE-ACETAMINOPHEN 10-325 MG PO TABS
1.0000 | ORAL_TABLET | Freq: Three times a day (TID) | ORAL | 0 refills | Status: AC | PRN
Start: 2022-12-25 — End: 2022-12-30

## 2022-12-26 ENCOUNTER — Telehealth: Payer: Self-pay | Admitting: Internal Medicine

## 2022-12-26 LAB — HEPATIC FUNCTION PANEL
AG Ratio: 1 (calc) (ref 1.0–2.5)
ALT: 10 U/L (ref 6–29)
AST: 19 U/L (ref 10–35)
Albumin: 4.1 g/dL (ref 3.6–5.1)
Alkaline phosphatase (APISO): 70 U/L (ref 37–153)
Bilirubin, Direct: 0.1 mg/dL (ref 0.0–0.2)
Globulin: 4.3 g/dL — ABNORMAL HIGH (ref 1.9–3.7)
Indirect Bilirubin: 0.6 mg/dL (ref 0.2–1.2)
Total Bilirubin: 0.7 mg/dL (ref 0.2–1.2)
Total Protein: 8.4 g/dL — ABNORMAL HIGH (ref 6.1–8.1)

## 2022-12-26 LAB — LIPID PANEL
Cholesterol: 161 mg/dL (ref <200)
HDL: 55 mg/dL (ref >=50)
LDL Cholesterol (Calc): 86 mg/dL
Non-HDL Cholesterol (Calc): 106 mg/dL (ref <130)
Total CHOL/HDL Ratio: 2.9 (calc) (ref <5.0)
Triglycerides: 105 mg/dL (ref <150)

## 2022-12-26 LAB — HEMOGLOBIN A1C
Hgb A1c MFr Bld: 6 %{Hb} — ABNORMAL HIGH (ref <5.7)
Mean Plasma Glucose: 126 mg/dL
eAG (mmol/L): 7 mmol/L

## 2022-12-26 LAB — TSH: TSH: 1.31 m[IU]/L (ref 0.40–4.50)

## 2022-12-26 NOTE — Patient Instructions (Addendum)
Patient is cleared for surgery for left hip arthroplasty.  Continue current medications.  Given a 5-day prescription for hydrocodone APAP to take sparingly for hip and musculoskeletal pain.  Continue thyroid replacement and antihypertensive medications.  Continue Zocor for hyperlipidemia and potassium supplement as she is on torsemide.  She will be due for follow-up here in 6 months.

## 2022-12-26 NOTE — Telephone Encounter (Signed)
Received Surgery Clearance Form from [facility] DR Ollen Gross, for Left Total Hip Arthroplasty , with Choice of Anesthesia.  Return signed form back to [facility] phone [phone], fax [phone]

## 2023-01-16 NOTE — H&P (Signed)
TOTAL HIP ADMISSION H&P  Patient is admitted for left total hip arthroplasty.  Subjective:  Chief Complaint: Left hip pain  HPI: [name], 82 y.o. female, has a history of pain and functional disability in the left hip due to arthritis and patient has failed non-surgical conservative treatments for greater than 12 weeks to include NSAID's and/or analgesics, use of assistive devices, and activity modification. Onset of symptoms was gradual, starting  several  years ago with gradually worsening course since that time. The patient noted no past surgery on the left hip. Patient currently rates pain in the left hip at 8 out of 10 with activity. Patient has night pain, worsening of pain with activity and weight bearing, and trendelenberg gait. 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 This condition presents safety issues increasing the risk of falls. There is no current active infection.  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 Degenerative disc disease- back, knees, hands   Back pain    Cataract 11/2016   hx - surgery to remove   Hyperlipidemia    Hypertension    Hypothyroidism    Pre-diabetes    Renal insufficiency    Renal insufficiency    Rotator cuff disorder    Pain with limited ROM right, spur left shoulder    Thyroid disease     Past Surgical History:  Procedure Laterality Date   CATARACT EXTRACTION, BILATERAL Bilateral 11/2016   INJECTION KNEE Right 07/16/2015   Procedure: RIGHT KNEE INJECTION;  Surgeon: Ollen Gross, MD;  Location: [facility];  Service: Orthopedics;  Laterality: Right;   JOINT REPLACEMENT Right 10/2008   RTHA-hip   REVERSE SHOULDER ARTHROPLASTY Left 08/15/2016   Procedure: REVERSE LEFT SHOULDER ARTHROPLASTY;  Surgeon: Beverely Low, MD;  Location: [facility] OR;  Service: Orthopedics;  Laterality: Left;   TOTAL KNEE ARTHROPLASTY Left 07/16/2015   Procedure: LEFT TOTAL KNEE ARTHROPLASTY;  Surgeon: Ollen Gross, MD;  Location: [facility];  Service: Orthopedics;  Laterality: Left;   TOTAL KNEE ARTHROPLASTY Right 01/26/2017   Procedure: RIGHT TOTAL KNEE ARTHROPLASTY;  Surgeon: Ollen Gross, MD;  Location: [facility];  Service: Orthopedics;  Laterality: Right;    Prior to Admission medications   Medication Sig Start Date End Date Taking? Authorizing Provider  amLODipine (NORVASC) 5 MG tablet TAKE 1 TABLET BY MOUTH EVERY DAY 12/04/22   Margaree Mackintosh, MD  cyclobenzaprine (FLEXERIL) 10 MG tablet One tab daily as needed for muscle spasms 10/24/22   Margaree Mackintosh, MD  levothyroxine (SYNTHROID) 100 MCG tablet Take 1 tablet (100 mcg total) by mouth daily. 09/25/21   Margaree Mackintosh, MD  meloxicam (MOBIC) 15 MG tablet TAKE 1 TABLET BY MOUTH ONCE DAILY 02/27/22   Margaree Mackintosh, MD  metoprolol succinate (TOPROL-XL) 25 MG 24 hr tablet TAKE 1 TABLET BY MOUTH DAILY 06/03/22  Margaree Mackintosh, MD  nabumetone (RELAFEN) 500 MG tablet Take 1 tablet (500 mg total) by mouth in the morning and at bedtime. 04/18/22   Margaree Mackintosh, MD  potassium chloride (KLOR-CON) 10 MEQ tablet TAKE 1 TABLET BY MOUTH ONCE DAILY 02/27/22   Margaree Mackintosh, MD  simvastatin (ZOCOR) 20 MG tablet TAKE 1 TABLET BY MOUTH AT BEDTIME 12/04/22   Margaree Mackintosh, MD  torsemide (DEMADEX) 20 MG tablet TAKE 1 TABLET BY MOUTH DAILY 06/03/22   Baxley, Luanna Cole, MD    Allergies  Allergen Reactions   Prinivil [Lisinopril] Cough    Social History   Socioeconomic History   Marital status: Married    Spouse name: Not on file   Number of children: Not on file   Years of education: Not on file   Highest education level: Not on file  Occupational History   Not on file  Tobacco Use   Smoking status: Never   Smokeless tobacco: Never  Vaping Use   Vaping status: Never Used  Substance and Sexual Activity   Alcohol use: No   Drug use: No   Sexual activity: Not Currently    Birth control/protection: Post-menopausal  Other Topics Concern   Not on file  Social History Narrative   Not on file   Social Determinants of Health   Financial Resource Strain: Medium Risk (02/26/2022)   Overall Financial Resource Strain (CARDIA)    Difficulty of Paying Living Expenses: Somewhat hard  Food Insecurity: No Food Insecurity (02/26/2022)   Hunger Vital Sign    Worried About Running Out of Food in the Last Year: Never true    Ran Out of Food in the Last Year: Never true  Transportation Needs: No Transportation Needs (02/26/2022)   PRAPARE - Administrator, Civil Service (Medical): No    Lack of Transportation (Non-Medical): No  Physical Activity: Inactive (02/26/2022)   Exercise Vital Sign    Days of Exercise per Week: 0 days    Minutes of Exercise per Session: 0 min  Stress: No Stress Concern Present (02/26/2022)   [employer] of Occupational Health - Occupational Stress Questionnaire    Feeling of Stress : Not at all  Social Connections: Moderately Isolated (02/26/2022)   Social Connection and Isolation Panel [NHANES]    Frequency of Communication with Friends and Family: More than three times a week    Frequency of Social Gatherings with Friends and Family: Once a week    Attends Religious Services: 1 to 4 times per year    Active Member of [employer] or Organizations: No    Attends Banker Meetings: Never     Marital Status: Widowed  Intimate Partner Violence: Not At Risk (02/26/2022)   Humiliation, Afraid, Rape, and Kick questionnaire    Fear of Current or Ex-Partner: No    Emotionally Abused: No    Physically Abused: No    Sexually Abused: No    Tobacco Use: Low Risk  (12/25/2022)   Patient History    Smoking Tobacco Use: Never    Smokeless Tobacco Use: Never    Passive Exposure: Not on file   Social History   Substance and Sexual Activity  Alcohol Use No    Family History  Problem Relation Age of Onset   Hypertension Mother    Stroke Mother    Cancer Father        Prostate cancer   Cancer Brother  Lung cancer   Hypertension Sister    Hypertension Child    Diabetes Child    Colon cancer Neg Hx    Esophageal cancer Neg Hx    Stomach cancer Neg Hx    Pancreatic cancer Neg Hx     Review of Systems  Constitutional:  Negative for chills and fever.  HENT: Negative.    Eyes: Negative.   Respiratory:  Negative for cough and shortness of breath.   Cardiovascular:  Negative for chest pain and palpitations.  Gastrointestinal:  Negative for abdominal pain, nausea and vomiting.  Genitourinary:  Negative for dysuria, frequency and urgency.  Musculoskeletal:  Positive for joint pain.  Skin:  Negative for rash.   Objective:  Physical Exam: Well nourished and well developed.  General: Alert and oriented x3, cooperative and pleasant, no acute distress.  Head: normocephalic, atraumatic, neck supple.  Eyes: EOMI.  Abdomen: non-tender to palpation and soft, normoactive bowel sounds. Musculoskeletal: - Evaluation of her left hip shows flexion to 90 degrees with no internal or external rotation, only approximately 10 degrees of abduction. - There is some tenderness in the lower lumbar paraspinals but no bony tenderness.  - She has a significantly antalgic gait pattern on the left with an abductor lurch. She is ambulating with a walker. Calves soft and nontender. Motor  function intact in LE. Strength 5/5 LE bilaterally. Neuro: Distal pulses 2+. Sensation to light touch intact in LE.  Vital signs in last 24 hours: BP: ()/()  Arterial Line BP: ()/()   Imaging Review Plain radiographs demonstrate severe degenerative joint disease of the left hip. The bone quality appears to be adequate for age and reported activity level.  Assessment/Plan:  End stage arthritis, left hip  The patient history, physical examination, clinical judgement of the provider and imaging studies are consistent with end stage degenerative joint disease of the left hip and total hip arthroplasty is deemed medically necessary. The treatment options including medical management, injection therapy, arthroscopy and arthroplasty were discussed at length. The risks and benefits of total hip arthroplasty were presented and reviewed. The risks due to aseptic loosening, infection, stiffness, dislocation/subluxation, thromboembolic complications and other imponderables were discussed. The patient acknowledged the explanation, agreed to proceed with the plan and consent was signed. Patient is being admitted for inpatient treatment for surgery, pain control, PT, OT, prophylactic antibiotics, VTE prophylaxis, progressive ambulation and ADLs and discharge planning.The patient is planning to be discharged  home .  Therapy Plans: HEP Disposition: Home with Daughter Planned DVT Prophylaxis: Aspirin 81 mg BID DME Needed: RW PCP: Marlan Palau, MD (EPIC note 12/25/2022) TXA: IV Allergies: lisinopril (cough) Anesthesia Concerns: None BMI: 35.7 Last HgbA1c: not diabetic  Pharmacy: [facility] (deliver to room)  Other: -Will likely require additional night in hospital due to deconditioning  - Patient was instructed on what medications to stop prior to surgery. - Follow-up visit in 2 weeks with Dr. Lequita Halt - Begin physical therapy following surgery - Pre-operative lab work as pre-surgical testing -  Prescriptions will be provided in hospital at time of discharge  R. Arcola Jansky, PA-C Orthopedic Surgery [facility] [place]

## 2023-01-22 NOTE — Patient Instructions (Signed)
SURGICAL WAITING ROOM VISITATION  Patients having surgery or a procedure may have no more than 2 support people in the waiting area - these visitors may rotate.    Children under the age of 73 must have an adult with them who is not the patient.  Due to an increase in RSV and influenza rates and associated hospitalizations, children ages 65 and under may not visit patients in [facility] hospitals.  If the patient needs to stay at the hospital during part of their recovery, the visitor guidelines for inpatient rooms apply. Pre-op nurse will coordinate an appropriate time for 1 support person to accompany patient in pre-op.  This support person may not rotate.    Please refer to the [facility] website for the visitor guidelines for Inpatients (after your surgery is over and you are in a regular room).       Your procedure is scheduled on:  02/09/2023    Report to [facility] Main Entrance    Report to admitting at   1115AM   Call this number if you have problems the morning of surgery [phone]   Do not eat food :After Midnight.   After Midnight you may have the following liquids until ___ 1045___ AM DAY OF SURGERY  Water Non-Citrus Juices (without pulp, NO RED-Apple, White grape, White cranberry) Black Coffee (NO MILK/CREAM OR CREAMERS, sugar ok)  Clear Tea (NO MILK/CREAM OR CREAMERS, sugar ok) regular and decaf                             Plain Jell-O (NO RED)                                           Fruit ices (not with fruit pulp, NO RED)                                     Popsicles (NO RED)                                                               Sports drinks like Gatorade (NO RED)                    The day of surgery:  Drink ONE (1) Pre-Surgery Clear Ensure or G2 at 1045  ( have completed by )  the morning of surgery. Drink in one sitting. Do not sip.  This drink was given to you during your hospital  pre-op appointment visit. Nothing else to drink  after completing the  Pre-Surgery Clear Ensure or G2.          If you have questions, please contact your surgeon's office.       Oral Hygiene is also important to reduce your risk of infection.                                    Remember - BRUSH YOUR TEETH THE MORNING OF SURGERY WITH YOUR  REGULAR TOOTHPASTE  DENTURES WILL BE REMOVED PRIOR TO SURGERY PLEASE DO NOT APPLY "Poly grip" OR ADHESIVES!!!   Do NOT smoke after Midnight   Stop all vitamins and herbal supplements 7 days before surgery.   Take these medicines the morning of surgery with A SIP OF WATER:   DO NOT TAKE ANY ORAL DIABETIC MEDICATIONS DAY OF YOUR SURGERY amlodipine, synthroid, toprol   Bring CPAP mask and tubing day of surgery.                              You may not have any metal on your body including hair pins, jewelry, and body piercing             Do not wear make-up, lotions, powders, perfumes/cologne, or deodorant  Do not wear nail polish including gel and S&S, artificial/acrylic nails, or any other type of covering on natural nails including finger and toenails. If you have artificial nails, gel coating, etc. that needs to be removed by a nail salon please have this removed prior to surgery or surgery may need to be canceled/ delayed if the surgeon/ anesthesia feels like they are unable to be safely monitored.   Do not shave  48 hours prior to surgery.               Men may shave face and neck.   Do not bring valuables to the hospital. [facility] IS NOT             RESPONSIBLE   FOR VALUABLES.   Contacts, glasses, dentures or bridgework may not be worn into surgery.   Bring small overnight bag day of surgery.   DO NOT BRING YOUR HOME MEDICATIONS TO THE HOSPITAL. PHARMACY WILL DISPENSE MEDICATIONS LISTED ON YOUR MEDICATION LIST TO YOU DURING YOUR ADMISSION IN THE HOSPITAL!    Patients discharged on the day of surgery will not be allowed to drive home.  Someone NEEDS to stay with you for the first  24 hours after anesthesia.   Special Instructions: Bring a copy of your healthcare power of attorney and living will documents the day of surgery if you haven't scanned them before.              Please read over the following fact sheets you were given: IF YOU HAVE QUESTIONS ABOUT YOUR PRE-OP INSTRUCTIONS PLEASE CALL [phone]   If you received a COVID test during your pre-op visit  it is requested that you wear a mask when out in public, stay away from anyone that may not be feeling well and notify your surgeon if you develop symptoms. If you test positive for Covid or have been in contact with anyone that has tested positive in the last 10 days please notify you surgeon.      Pre-operative 5 CHG Bath Instructions   You can play a key role in reducing the risk of infection after surgery. Your skin needs to be as free of germs as possible. You can reduce the number of germs on your skin by washing with CHG (chlorhexidine gluconate) soap before surgery. CHG is an antiseptic soap that kills germs and continues to kill germs even after washing.   DO NOT use if you have an allergy to chlorhexidine/CHG or antibacterial soaps. If your skin becomes reddened or irritated, stop using the CHG and notify one of our RNs at [phone].   Please shower with the CHG soap  starting 4 days before surgery using the following schedule:     Please keep in mind the following:  DO NOT shave, including legs and underarms, starting the day of your first shower.   You may shave your face at any point before/day of surgery.  Place clean sheets on your bed the day you start using CHG soap. Use a clean washcloth (not used since being washed) for each shower. DO NOT sleep with pets once you start using the CHG.   CHG Shower Instructions:  If you choose to wash your hair and private area, wash first with your normal shampoo/soap.  After you use shampoo/soap, rinse your hair and body thoroughly to remove  shampoo/soap residue.  Turn the water OFF and apply about 3 tablespoons (45 ml) of CHG soap to a CLEAN washcloth.  Apply CHG soap ONLY FROM YOUR NECK DOWN TO YOUR TOES (washing for 3-5 minutes)  DO NOT use CHG soap on face, private areas, open wounds, or sores.  Pay special attention to the area where your surgery is being performed.  If you are having back surgery, having someone wash your back for you may be helpful. Wait 2 minutes after CHG soap is applied, then you may rinse off the CHG soap.  Pat dry with a clean towel  Put on clean clothes/pajamas   If you choose to wear lotion, please use ONLY the CHG-compatible lotions on the back of this paper.     Additional instructions for the day of surgery: DO NOT APPLY any lotions, deodorants, cologne, or perfumes.   Put on clean/comfortable clothes.  Brush your teeth.  Ask your nurse before applying any prescription medications to the skin.      CHG Compatible Lotions   Aveeno Moisturizing lotion  Cetaphil Moisturizing Cream  Cetaphil Moisturizing Lotion  Clairol Herbal Essence Moisturizing Lotion, Dry Skin  Clairol Herbal Essence Moisturizing Lotion, Extra Dry Skin  Clairol Herbal Essence Moisturizing Lotion, Normal Skin  Curel Age Defying Therapeutic Moisturizing Lotion with Alpha Hydroxy  Curel Extreme Care Body Lotion  Curel Soothing Hands Moisturizing Hand Lotion  Curel Therapeutic Moisturizing Cream, Fragrance-Free  Curel Therapeutic Moisturizing Lotion, Fragrance-Free  Curel Therapeutic Moisturizing Lotion, Original Formula  Eucerin Daily Replenishing Lotion  Eucerin Dry Skin Therapy Plus Alpha Hydroxy Crme  Eucerin Dry Skin Therapy Plus Alpha Hydroxy Lotion  Eucerin Original Crme  Eucerin Original Lotion  Eucerin Plus Crme Eucerin Plus Lotion  Eucerin TriLipid Replenishing Lotion  Keri Anti-Bacterial Hand Lotion  Keri Deep Conditioning Original Lotion Dry Skin Formula Softly Scented  Keri Deep Conditioning  Original Lotion, Fragrance Free Sensitive Skin Formula  Keri Lotion Fast Absorbing Fragrance Free Sensitive Skin Formula  Keri Lotion Fast Absorbing Softly Scented Dry Skin Formula  Keri Original Lotion  Keri Skin Renewal Lotion Keri Silky Smooth Lotion  Keri Silky Smooth Sensitive Skin Lotion  Nivea Body Creamy Conditioning Oil  Nivea Body Extra Enriched Teacher, adult education Moisturizing Lotion Nivea Crme  Nivea Skin Firming Lotion  NutraDerm 30 Skin Lotion  NutraDerm Skin Lotion  NutraDerm Therapeutic Skin Cream  NutraDerm Therapeutic Skin Lotion  ProShield Protective Hand Cream  Provon moisturizing lotion

## 2023-01-22 NOTE — Progress Notes (Addendum)
Anesthesia Review:  PCP: DR Eden Emms Baxley LOV 12/25/22- clearance in Media dated 12/26/22  Cardiologist : none  Chest x-ray : EKG : 01/27/23  Echo : Stress test: Cardiac Cath :  Activity level: can do a flgiht of stairs without difficutly  Sleep Study/ CPAP : none  Fasting Blood Sugar :      / Checks Blood Sugar -- times a day:   Blood Thinner/ Instructions /Last Dose: ASA / Instructions/ Last Dose :    Pre diabetes - on no meds does not check glucose at home  12/25/22-hgba1c-6.0    Daughter accompanied pt to preop appt today.     BMP done 01/27/23 routed to DR Aluisio on 01/27/23.

## 2023-01-27 ENCOUNTER — Encounter (HOSPITAL_COMMUNITY): Payer: Self-pay

## 2023-01-27 ENCOUNTER — Other Ambulatory Visit: Payer: Self-pay

## 2023-01-27 ENCOUNTER — Encounter (HOSPITAL_COMMUNITY)
Admission: RE | Admit: 2023-01-27 | Discharge: 2023-01-27 | Disposition: A | Discharge status: Home / Self Care | Payer: Medicare HMO | Source: Ambulatory Visit | Attending: Orthopedic Surgery | Admitting: Orthopedic Surgery

## 2023-01-27 VITALS — BP 160/65 | HR 77 | Temp 97.9°F | Resp 16 | Ht 64.0 in

## 2023-01-27 DIAGNOSIS — Z01818 Encounter for other preprocedural examination: Secondary | ICD-10-CM | POA: Insufficient documentation

## 2023-01-27 DIAGNOSIS — I4510 Unspecified right bundle-branch block: Secondary | ICD-10-CM | POA: Diagnosis not present

## 2023-01-27 LAB — BASIC METABOLIC PANEL
Anion gap: 10 (ref 5–15)
BUN: 27 mg/dL — ABNORMAL HIGH (ref 8–23)
CO2: 25 mmol/L (ref 22–32)
Calcium: 9.9 mg/dL (ref 8.9–10.3)
Chloride: 101 mmol/L (ref 98–111)
Creatinine, Ser: 1.53 mg/dL — ABNORMAL HIGH (ref 0.44–1.00)
GFR, Estimated: 34 mL/min — ABNORMAL LOW (ref >60)
Glucose, Bld: 101 mg/dL — ABNORMAL HIGH (ref 70–99)
Potassium: 3.7 mmol/L (ref 3.5–5.1)
Sodium: 136 mmol/L (ref 135–145)

## 2023-01-27 LAB — CBC
HCT: 36.5 % (ref 36.0–46.0)
Hemoglobin: 12.3 g/dL (ref 12.0–15.0)
MCH: 31.9 pg (ref 26.0–34.0)
MCHC: 33.7 g/dL (ref 30.0–36.0)
MCV: 94.8 fL (ref 80.0–100.0)
Platelets: 239 10*3/uL (ref 150–400)
RBC: 3.85 MIL/uL — ABNORMAL LOW (ref 3.87–5.11)
RDW: 14 % (ref 11.5–15.5)
WBC: 5.4 10*3/uL (ref 4.0–10.5)
nRBC: 0 % (ref 0.0–0.2)

## 2023-01-27 LAB — SURGICAL PCR SCREEN
MRSA, PCR: NEGATIVE
Staphylococcus aureus: NEGATIVE

## 2023-01-27 LAB — TYPE AND SCREEN
ABO/RH(D): O POS
Antibody Screen: NEGATIVE

## 2023-01-28 ENCOUNTER — Encounter (HOSPITAL_COMMUNITY): Payer: Self-pay

## 2023-01-28 NOTE — Anesthesia Preprocedure Evaluation (Addendum)
Anesthesia Evaluation  Patient identified by MRN, date of birth, ID band Patient awake    Reviewed: Allergy & Precautions, H&P , NPO status , Patient's Chart, lab work & pertinent test results  Airway Mallampati: II   Neck ROM: full    Dental   Pulmonary neg pulmonary ROS   breath sounds clear to auscultation       Cardiovascular hypertension,  Rhythm:regular Rate:Normal     Neuro/Psych   Anxiety        GI/Hepatic   Endo/Other  diabetesHypothyroidism    Renal/GU Renal InsufficiencyRenal disease     Musculoskeletal  (+) Arthritis ,    Abdominal   Peds  Hematology   Anesthesia Other Findings   Reproductive/Obstetrics                             Anesthesia Physical Anesthesia Plan  ASA: 3  Anesthesia Plan: MAC and Spinal   Post-op Pain Management:    Induction: Intravenous  PONV Risk Score and Plan: 2 and Ondansetron, Propofol infusion and Treatment may vary due to age or medical condition  Airway Management Planned: Simple Face Mask  Additional Equipment:   Intra-op Plan:   Post-operative Plan:   Informed Consent: I have reviewed the patients History and Physical, chart, labs and discussed the procedure including the risks, benefits and alternatives for the proposed anesthesia with the patient or authorized representative who has indicated his/her understanding and acceptance.     Dental advisory given  Plan Discussed with: CRNA, Anesthesiologist and Surgeon  Anesthesia Plan Comments: (Reviewed. CKD mildly worse. EKG stable. Medical clearance in media tab (11/4))        Anesthesia Quick Evaluation

## 2023-02-09 ENCOUNTER — Observation Stay (HOSPITAL_COMMUNITY)
Admission: RE | Admit: 2023-02-09 | Discharge: 2023-02-10 | Disposition: A | Discharge status: Home / Self Care | Payer: Medicare HMO | Source: Ambulatory Visit | Attending: Orthopedic Surgery | Admitting: Orthopedic Surgery

## 2023-02-09 ENCOUNTER — Ambulatory Visit (HOSPITAL_COMMUNITY): Payer: Medicare HMO | Admitting: Medical

## 2023-02-09 ENCOUNTER — Encounter (HOSPITAL_COMMUNITY): Payer: Self-pay | Admitting: Orthopedic Surgery

## 2023-02-09 ENCOUNTER — Ambulatory Visit (HOSPITAL_COMMUNITY): Payer: Medicare HMO

## 2023-02-09 ENCOUNTER — Observation Stay (HOSPITAL_COMMUNITY): Payer: Medicare HMO

## 2023-02-09 ENCOUNTER — Ambulatory Visit (HOSPITAL_COMMUNITY): Payer: Medicare HMO | Admitting: Certified Registered Nurse Anesthetist

## 2023-02-09 ENCOUNTER — Encounter (HOSPITAL_COMMUNITY)
Admission: RE | Disposition: A | Discharge status: Home / Self Care | Payer: Self-pay | Source: Ambulatory Visit | Attending: Orthopedic Surgery

## 2023-02-09 ENCOUNTER — Other Ambulatory Visit: Payer: Self-pay

## 2023-02-09 DIAGNOSIS — M1612 Unilateral primary osteoarthritis, left hip: Secondary | ICD-10-CM

## 2023-02-09 DIAGNOSIS — E1122 Type 2 diabetes mellitus with diabetic chronic kidney disease: Secondary | ICD-10-CM | POA: Diagnosis not present

## 2023-02-09 DIAGNOSIS — I129 Hypertensive chronic kidney disease with stage 1 through stage 4 chronic kidney disease, or unspecified chronic kidney disease: Secondary | ICD-10-CM | POA: Diagnosis not present

## 2023-02-09 DIAGNOSIS — Z471 Aftercare following joint replacement surgery: Secondary | ICD-10-CM | POA: Diagnosis not present

## 2023-02-09 DIAGNOSIS — N189 Chronic kidney disease, unspecified: Secondary | ICD-10-CM | POA: Diagnosis not present

## 2023-02-09 DIAGNOSIS — Z01818 Encounter for other preprocedural examination: Secondary | ICD-10-CM

## 2023-02-09 DIAGNOSIS — Z96653 Presence of artificial knee joint, bilateral: Secondary | ICD-10-CM | POA: Diagnosis not present

## 2023-02-09 DIAGNOSIS — M169 Osteoarthritis of hip, unspecified: Principal | ICD-10-CM | POA: Diagnosis present

## 2023-02-09 DIAGNOSIS — Z79899 Other long term (current) drug therapy: Secondary | ICD-10-CM | POA: Insufficient documentation

## 2023-02-09 DIAGNOSIS — E039 Hypothyroidism, unspecified: Secondary | ICD-10-CM | POA: Insufficient documentation

## 2023-02-09 DIAGNOSIS — Z96643 Presence of artificial hip joint, bilateral: Secondary | ICD-10-CM | POA: Diagnosis not present

## 2023-02-09 DIAGNOSIS — Z96642 Presence of left artificial hip joint: Secondary | ICD-10-CM | POA: Diagnosis not present

## 2023-02-09 DIAGNOSIS — N182 Chronic kidney disease, stage 2 (mild): Secondary | ICD-10-CM | POA: Diagnosis not present

## 2023-02-09 HISTORY — PX: TOTAL HIP ARTHROPLASTY: SHX124

## 2023-02-09 SURGERY — ARTHROPLASTY, HIP, TOTAL, ANTERIOR APPROACH
Anesthesia: Monitor Anesthesia Care | Site: Hip | Laterality: Left

## 2023-02-09 MED ORDER — BUPIVACAINE-EPINEPHRINE 0.25% -1:200000 IJ SOLN
INTRAMUSCULAR | Status: AC
  Filled 2023-02-09: qty 1

## 2023-02-09 MED ORDER — LACTATED RINGERS IV SOLN: INTRAVENOUS | Status: DC

## 2023-02-09 MED ORDER — BISACODYL 10 MG RE SUPP: 10.0000 mg | Freq: Every day | RECTAL | Status: DC | PRN

## 2023-02-09 MED ORDER — CHLORHEXIDINE GLUCONATE 0.12 % MT SOLN
15.0000 mL | Freq: Once | OROMUCOSAL | Status: AC
  Administered 2023-02-09: 15 mL via OROMUCOSAL

## 2023-02-09 MED ORDER — ONDANSETRON HCL 4 MG/2ML IJ SOLN: 4.0000 mg | Freq: Four times a day (QID) | INTRAMUSCULAR | Status: DC | PRN

## 2023-02-09 MED ORDER — MAGNESIUM CITRATE PO SOLN: 1.0000 | Freq: Once | ORAL | Status: DC | PRN

## 2023-02-09 MED ORDER — AMLODIPINE BESYLATE 5 MG PO TABS
5.0000 mg | ORAL_TABLET | Freq: Every day | ORAL | Status: DC
  Filled 2023-02-09: qty 1

## 2023-02-09 MED ORDER — EPHEDRINE SULFATE-NACL 50-0.9 MG/10ML-% IV SOSY
PREFILLED_SYRINGE | INTRAVENOUS | Status: DC | PRN
  Administered 2023-02-09: 5 mg via INTRAVENOUS
  Administered 2023-02-09 (×2): 10 mg via INTRAVENOUS

## 2023-02-09 MED ORDER — ALBUMIN HUMAN 5 % IV SOLN: INTRAVENOUS | Status: DC | PRN

## 2023-02-09 MED ORDER — FENTANYL CITRATE PF 50 MCG/ML IJ SOSY: 25.0000 ug | PREFILLED_SYRINGE | INTRAMUSCULAR | Status: DC | PRN

## 2023-02-09 MED ORDER — METOCLOPRAMIDE HCL 5 MG/ML IJ SOLN: 5.0000 mg | Freq: Three times a day (TID) | INTRAMUSCULAR | Status: DC | PRN

## 2023-02-09 MED ORDER — METOCLOPRAMIDE HCL 5 MG PO TABS: 5.0000 mg | ORAL_TABLET | Freq: Three times a day (TID) | ORAL | Status: DC | PRN

## 2023-02-09 MED ORDER — ONDANSETRON HCL 4 MG PO TABS: 4.0000 mg | ORAL_TABLET | Freq: Four times a day (QID) | ORAL | Status: DC | PRN

## 2023-02-09 MED ORDER — PROPOFOL 500 MG/50ML IV EMUL
INTRAVENOUS | Status: DC | PRN
  Administered 2023-02-09: 30 mg via INTRAVENOUS
  Administered 2023-02-09: 75 ug/kg/min via INTRAVENOUS

## 2023-02-09 MED ORDER — LACTATED RINGERS IV SOLN
INTRAVENOUS | Status: DC
Start: 2023-02-09 — End: 2023-02-09

## 2023-02-09 MED ORDER — SODIUM CHLORIDE 0.9 % IV SOLN: INTRAVENOUS | Status: DC

## 2023-02-09 MED ORDER — TRANEXAMIC ACID-NACL 1000-0.7 MG/100ML-% IV SOLN
1000.0000 mg | INTRAVENOUS | Status: AC
Start: 2023-02-09 — End: 2023-02-09
  Administered 2023-02-09: 1000 mg via INTRAVENOUS
  Filled 2023-02-09: qty 100

## 2023-02-09 MED ORDER — POLYETHYLENE GLYCOL 3350 17 G PO PACK: 17.0000 g | PACK | Freq: Every day | ORAL | Status: DC | PRN

## 2023-02-09 MED ORDER — MORPHINE SULFATE (PF) 2 MG/ML IV SOLN
0.5000 mg | INTRAVENOUS | Status: DC | PRN
Start: 2023-02-09 — End: 2023-02-10

## 2023-02-09 MED ORDER — BUPIVACAINE-EPINEPHRINE (PF) 0.25% -1:200000 IJ SOLN
INTRAMUSCULAR | Status: DC | PRN
  Administered 2023-02-09: 30 mL

## 2023-02-09 MED ORDER — BUPIVACAINE IN DEXTROSE 0.75-8.25 % IT SOLN
INTRATHECAL | Status: DC | PRN
  Administered 2023-02-09: 1.4 mL via INTRATHECAL

## 2023-02-09 MED ORDER — POTASSIUM CHLORIDE ER 10 MEQ PO TBCR
10.0000 meq | EXTENDED_RELEASE_TABLET | Freq: Every day | ORAL | Status: DC
  Administered 2023-02-10: 10 meq via ORAL
  Filled 2023-02-09: qty 1

## 2023-02-09 MED ORDER — MENTHOL 3 MG MT LOZG: 1.0000 | LOZENGE | OROMUCOSAL | Status: DC | PRN

## 2023-02-09 MED ORDER — TORSEMIDE 20 MG PO TABS
20.0000 mg | ORAL_TABLET | Freq: Every day | ORAL | Status: DC
  Administered 2023-02-10: 20 mg via ORAL
  Filled 2023-02-09 (×2): qty 1

## 2023-02-09 MED ORDER — OXYCODONE HCL 5 MG PO TABS: 5.0000 mg | ORAL_TABLET | Freq: Once | ORAL | Status: DC | PRN

## 2023-02-09 MED ORDER — HYDROCODONE-ACETAMINOPHEN 5-325 MG PO TABS
1.0000 | ORAL_TABLET | ORAL | Status: DC | PRN
  Administered 2023-02-09 – 2023-02-10 (×4): 2 via ORAL
  Filled 2023-02-09 (×5): qty 2

## 2023-02-09 MED ORDER — ASPIRIN 81 MG PO CHEW
81.0000 mg | CHEWABLE_TABLET | Freq: Two times a day (BID) | ORAL | Status: DC
  Administered 2023-02-10: 81 mg via ORAL
  Filled 2023-02-09: qty 1

## 2023-02-09 MED ORDER — DEXAMETHASONE SODIUM PHOSPHATE 10 MG/ML IJ SOLN
10.0000 mg | Freq: Once | INTRAMUSCULAR | Status: AC
  Administered 2023-02-10: 10 mg via INTRAVENOUS
  Filled 2023-02-09: qty 1

## 2023-02-09 MED ORDER — TRAMADOL HCL 50 MG PO TABS
50.0000 mg | ORAL_TABLET | Freq: Four times a day (QID) | ORAL | Status: DC
  Administered 2023-02-09 – 2023-02-10 (×2): 50 mg via ORAL
  Filled 2023-02-09 (×3): qty 1

## 2023-02-09 MED ORDER — CEFAZOLIN SODIUM-DEXTROSE 2-4 GM/100ML-% IV SOLN
2.0000 g | INTRAVENOUS | Status: AC
Start: 2023-02-09 — End: 2023-02-09
  Administered 2023-02-09: 2 g via INTRAVENOUS
  Filled 2023-02-09: qty 100

## 2023-02-09 MED ORDER — ORAL CARE MOUTH RINSE: 15.0000 mL | Freq: Once | OROMUCOSAL | Status: AC

## 2023-02-09 MED ORDER — ONDANSETRON HCL 4 MG/2ML IJ SOLN
4.0000 mg | Freq: Four times a day (QID) | INTRAMUSCULAR | Status: DC | PRN
Start: 2023-02-09 — End: 2023-02-10

## 2023-02-09 MED ORDER — OXYCODONE HCL 5 MG/5ML PO SOLN
5.0000 mg | Freq: Once | ORAL | Status: DC | PRN
Start: 2023-02-09 — End: 2023-02-09

## 2023-02-09 MED ORDER — DOCUSATE SODIUM 100 MG PO CAPS
100.0000 mg | ORAL_CAPSULE | Freq: Two times a day (BID) | ORAL | Status: DC
  Administered 2023-02-09 – 2023-02-10 (×2): 100 mg via ORAL
  Filled 2023-02-09 (×2): qty 1

## 2023-02-09 MED ORDER — DEXAMETHASONE SODIUM PHOSPHATE 10 MG/ML IJ SOLN
8.0000 mg | Freq: Once | INTRAMUSCULAR | Status: AC
  Administered 2023-02-09: 5 mg via INTRAVENOUS

## 2023-02-09 MED ORDER — PHENYLEPHRINE 80 MCG/ML (10ML) SYRINGE FOR IV PUSH (FOR BLOOD PRESSURE SUPPORT)
PREFILLED_SYRINGE | INTRAVENOUS | Status: DC | PRN
  Administered 2023-02-09: 100 ug via INTRAVENOUS

## 2023-02-09 MED ORDER — METOPROLOL SUCCINATE ER 25 MG PO TB24
25.0000 mg | ORAL_TABLET | Freq: Every day | ORAL | Status: DC
  Administered 2023-02-10: 25 mg via ORAL
  Filled 2023-02-09: qty 1

## 2023-02-09 MED ORDER — METHOCARBAMOL 500 MG PO TABS
500.0000 mg | ORAL_TABLET | Freq: Four times a day (QID) | ORAL | Status: DC | PRN
  Administered 2023-02-09: 500 mg via ORAL
  Filled 2023-02-09: qty 1

## 2023-02-09 MED ORDER — PHENOL 1.4 % MT LIQD: 1.0000 | OROMUCOSAL | Status: DC | PRN

## 2023-02-09 MED ORDER — METHOCARBAMOL 1000 MG/10ML IJ SOLN: 500.0000 mg | Freq: Four times a day (QID) | INTRAMUSCULAR | Status: DC | PRN

## 2023-02-09 MED ORDER — LEVOTHYROXINE SODIUM 75 MCG PO TABS
75.0000 ug | ORAL_TABLET | Freq: Every day | ORAL | Status: DC
  Administered 2023-02-10: 75 ug via ORAL
  Filled 2023-02-09: qty 1

## 2023-02-09 MED ORDER — ACETAMINOPHEN 325 MG PO TABS: 325.0000 mg | ORAL_TABLET | Freq: Four times a day (QID) | ORAL | Status: DC | PRN

## 2023-02-09 MED ORDER — ACETAMINOPHEN 10 MG/ML IV SOLN
1000.0000 mg | Freq: Four times a day (QID) | INTRAVENOUS | Status: DC
  Administered 2023-02-09: 1000 mg via INTRAVENOUS
  Filled 2023-02-09: qty 100

## 2023-02-09 MED ORDER — SIMVASTATIN 20 MG PO TABS
20.0000 mg | ORAL_TABLET | Freq: Every day | ORAL | Status: DC
  Administered 2023-02-09: 20 mg via ORAL
  Filled 2023-02-09: qty 1

## 2023-02-09 MED ORDER — CEFAZOLIN SODIUM-DEXTROSE 2-4 GM/100ML-% IV SOLN
2.0000 g | Freq: Four times a day (QID) | INTRAVENOUS | Status: AC
  Administered 2023-02-09 – 2023-02-10 (×2): 2 g via INTRAVENOUS
  Filled 2023-02-09 (×2): qty 100

## 2023-02-09 MED ORDER — 0.9 % SODIUM CHLORIDE (POUR BTL) OPTIME
TOPICAL | Status: DC | PRN
  Administered 2023-02-09: 1000 mL

## 2023-02-09 MED ORDER — POVIDONE-IODINE 10 % EX SWAB: 2.0000 | Freq: Once | CUTANEOUS | Status: DC

## 2023-02-09 MED ORDER — HYDROCODONE-ACETAMINOPHEN 7.5-325 MG PO TABS: 1.0000 | ORAL_TABLET | ORAL | Status: DC | PRN

## 2023-02-09 MED ORDER — PHENYLEPHRINE HCL-NACL 20-0.9 MG/250ML-% IV SOLN
INTRAVENOUS | Status: DC | PRN
  Administered 2023-02-09: 50 ug/min via INTRAVENOUS

## 2023-02-09 SURGICAL SUPPLY — 37 items
BAG COUNTER SPONGE SURGICOUNT (BAG) IMPLANT
BAG ZIPLOCK 12X15 (MISCELLANEOUS) IMPLANT
BLADE SAG 18X100X1.27 (BLADE) ×1 IMPLANT
COVER PERINEAL POST (MISCELLANEOUS) ×1 IMPLANT
COVER SURGICAL LIGHT HANDLE (MISCELLANEOUS) ×1 IMPLANT
CUP ACET PINNACLE SECTR 50MM (Hips) IMPLANT
DERMABOND ADVANCED .7 DNX12 (GAUZE/BANDAGES/DRESSINGS) ×1 IMPLANT
DRAPE FOOT SWITCH (DRAPES) ×1 IMPLANT
DRAPE STERI IOBAN 125X83 (DRAPES) ×1 IMPLANT
DRAPE U-SHAPE 47X51 STRL (DRAPES) ×2 IMPLANT
DRSG AQUACEL AG ADV 3.5X10 (GAUZE/BANDAGES/DRESSINGS) ×1 IMPLANT
DURAPREP 26ML APPLICATOR (WOUND CARE) ×1 IMPLANT
ELECT REM PT RETURN 15FT ADLT (MISCELLANEOUS) ×1 IMPLANT
GLOVE BIO SURGEON STRL SZ 6.5 (GLOVE) IMPLANT
GLOVE BIO SURGEON STRL SZ8 (GLOVE) ×1 IMPLANT
GLOVE BIOGEL PI IND STRL 6.5 (GLOVE) IMPLANT
GLOVE BIOGEL PI IND STRL 7.0 (GLOVE) IMPLANT
GLOVE BIOGEL PI IND STRL 8 (GLOVE) ×1 IMPLANT
GOWN STRL REUS W/ TWL LRG LVL3 (GOWN DISPOSABLE) ×1 IMPLANT
HEAD FEM STD 32X+1 STRL (Hips) IMPLANT
HOLDER FOLEY CATH W/STRAP (MISCELLANEOUS) ×1 IMPLANT
KIT TURNOVER KIT A (KITS) IMPLANT
LINER MARATHON 32 50 (Hips) IMPLANT
MANIFOLD NEPTUNE II (INSTRUMENTS) ×1 IMPLANT
PACK ANTERIOR HIP CUSTOM (KITS) ×1 IMPLANT
PENCIL SMOKE EVACUATOR COATED (MISCELLANEOUS) ×1 IMPLANT
PINNACLE SECTOR CUP 50MM (Hips) ×1 IMPLANT
SPIKE FLUID TRANSFER (MISCELLANEOUS) ×1 IMPLANT
STEM FEM ACTIS STD SZ4 (Stem) IMPLANT
SUT ETHIBOND NAB CT1 #1 30IN (SUTURE) ×1 IMPLANT
SUT MNCRL AB 4-0 PS2 18 (SUTURE) ×1 IMPLANT
SUT STRATAFIX 0 PDS 27 VIOLET (SUTURE) ×1
SUT VIC AB 2-0 CT1 TAPERPNT 27 (SUTURE) ×2 IMPLANT
SUTURE STRATFX 0 PDS 27 VIOLET (SUTURE) ×1 IMPLANT
TRAY FOLEY MTR SLVR 14FR STAT (SET/KITS/TRAYS/PACK) IMPLANT
TRAY FOLEY MTR SLVR 16FR STAT (SET/KITS/TRAYS/PACK) ×1 IMPLANT
TUBE SUCTION HIGH CAP CLEAR NV (SUCTIONS) ×1 IMPLANT

## 2023-02-09 NOTE — Care Plan (Signed)
Ortho Bundle Case Management Note  Patient Details  Name: [name] MRN: [id number] Date of Birth: 1940-08-11                  L THA on 02/09/23.  DCP: Home with daughter and son in law, staying at their house.  DME: RW ordered through [employer].  PT: HEP   DME Arranged:  [facility] rolling DME Agency:  [employer]    Additional Comments: Please contact me with any questions of if this plan should need to change.    Despina Pole, CCM Case Manager, [name]  [phone] 02/09/2023, 12:00 PM

## 2023-02-09 NOTE — Transfer of Care (Signed)
Immediate Anesthesia Transfer of Care Note  Patient: [name]  Procedure(s) Performed: TOTAL HIP ARTHROPLASTY ANTERIOR APPROACH (Left: Hip)  Patient Location: PACU  Anesthesia Type:Spinal  Level of Consciousness: sedated  Airway & Oxygen Therapy: Patient Spontanous Breathing and Patient connected to face mask oxygen  Post-op Assessment: Report given to RN and Post -op Vital signs reviewed and stable  Post vital signs: Reviewed and stable  Last Vitals:  Vitals Value Taken Time  BP 112/53 02/09/23 1556  Temp    Pulse 69 02/09/23 1558  Resp 15 02/09/23 1558  SpO2 100 % 02/09/23 1558  Vitals shown include unfiled device data.  Last Pain:  Vitals:   02/09/23 1205  TempSrc: Oral  PainSc:          Complications: No notable events documented.

## 2023-02-09 NOTE — Discharge Instructions (Signed)
Sydney Gross, MD Total Joint Specialist [facility] [address] [place], [place] [address] [phone]  ANTERIOR APPROACH TOTAL HIP REPLACEMENT POSTOPERATIVE DIRECTIONS     Hip Rehabilitation, Guidelines Following Surgery  The results of a hip operation are greatly improved after range of motion and muscle strengthening exercises. Follow all safety measures which are given to protect your hip. If any of these exercises cause increased pain or swelling in your joint, decrease the amount until you are comfortable again. Then slowly increase the exercises. Call your caregiver if you have problems or questions.   BLOOD CLOT PREVENTION Take 81 mg Aspirin two times a day for three weeks following surgery. Then take an 81 mg Aspirin once a day for three weeks. Then discontinue Aspirin. You may resume your vitamins/supplements upon discharge from the hospital. Do not take any NSAIDs (Advil, Aleve, Ibuprofen, Meloxicam, etc.) for 3 weeks, while taking 81mg  Aspirin twice a day.   HOME CARE INSTRUCTIONS  Remove items at home which could result in a fall. This includes throw rugs or furniture in walking pathways.  ICE to the affected hip as frequently as 20-30 minutes an hour and then as needed for pain and swelling. Continue to use ice on the hip for pain and swelling from surgery. You may notice swelling that will progress down to the foot and ankle. This is normal after surgery. Elevate the leg when you are not up walking on it.   Continue to use the breathing machine which will help keep your temperature down.  It is common for your temperature to cycle up and down following surgery, especially at night when you are not up moving around and exerting yourself.  The breathing machine keeps your lungs expanded and your temperature down.  DIET You may resume your previous home diet once your are discharged from the hospital.  DRESSING / WOUND CARE / SHOWERING You  have an adhesive waterproof bandage over the incision. Leave this in place until your first follow-up appointment. Once you remove this you will not need to place another bandage.  You may begin showering 3 days following surgery, but do not submerge the incision under water.  ACTIVITY For the first 3-5 days, it is important to rest and keep the operative leg elevated. You should, as a general rule, rest for 50 minutes and walk/stretch for 10 minutes per hour. After 5 days, you may slowly increase activity as tolerated.  Perform the exercises you were provided twice a day for about 15-20 minutes each session. Begin these 2 days following surgery. Walk with your walker as instructed. Use the walker until you are comfortable transitioning to a cane. Walk with the cane in the opposite hand of the operative leg. You may discontinue the cane once you are comfortable and walking steadily. Avoid periods of inactivity such as sitting longer than an hour when not asleep. This helps prevent blood clots.  Do not drive a car for 6 weeks or until released by your surgeon.  Do not drive while taking narcotics.  TED HOSE STOCKINGS Wear the elastic stockings on both legs for three weeks following surgery during the day. You may remove them at night while sleeping.  WEIGHT BEARING Weight bearing as tolerated with assist device (walker, cane, etc) as directed, use it as long as suggested by your surgeon or therapist, typically at least 4-6 weeks.  POSTOPERATIVE CONSTIPATION PROTOCOL Constipation - defined medically as fewer than three stools per week and severe  constipation as less than one stool per week.  One of the most common issues patients have following surgery is constipation.  Even if you have a regular bowel pattern at home, your normal regimen is likely to be disrupted due to multiple reasons following surgery.  Combination of anesthesia, postoperative narcotics, change in appetite and fluid intake all  can affect your bowels.  In order to avoid complications following surgery, here are some recommendations in order to help you during your recovery period.  Colace (docusate) - Pick up an over-the-counter form of Colace or another stool softener and take twice a day as long as you are requiring postoperative pain medications.  Take with a full glass of water daily.  If you experience loose stools or diarrhea, hold the colace until you stool forms back up.  If your symptoms do not get better within 1 week or if they get worse, check with your doctor. Dulcolax (bisacodyl) - Pick up over-the-counter and take as directed by the product packaging as needed to assist with the movement of your bowels.  Take with a full glass of water.  Use this product as needed if not relieved by Colace only.  MiraLax (polyethylene glycol) - Pick up over-the-counter to have on hand.  MiraLax is a solution that will increase the amount of water in your bowels to assist with bowel movements.  Take as directed and can mix with a glass of water, juice, soda, coffee, or tea.  Take if you go more than two days without a movement.Do not use MiraLax more than once per day. Call your doctor if you are still constipated or irregular after using this medication for 7 days in a row.  If you continue to have problems with postoperative constipation, please contact the office for further assistance and recommendations.  If you experience "the worst abdominal pain ever" or develop nausea or vomiting, please contact the office immediatly for further recommendations for treatment.  ITCHING  If you experience itching with your medications, try taking only a single pain pill, or even half a pain pill at a time.  You can also use Benadryl over the counter for itching or also to help with sleep.   MEDICATIONS See your medication summary on the "After Visit Summary" that the nursing staff will review with you prior to discharge.  You may have some  home medications which will be placed on hold until you complete the course of blood thinner medication.  It is important for you to complete the blood thinner medication as prescribed by your surgeon.  Continue your approved medications as instructed at time of discharge.  PRECAUTIONS If you experience chest pain or shortness of breath - call 911 immediately for transfer to the hospital emergency department.  If you develop a fever greater that 101 F, purulent drainage from wound, increased redness or drainage from wound, foul odor from the wound/dressing, or calf pain - CONTACT YOUR SURGEON.                                                   FOLLOW-UP APPOINTMENTS Make sure you keep all of your appointments after your operation with your surgeon and caregivers. You should call the office at the above phone number and make an appointment for approximately two weeks after the date of your surgery or  on the date instructed by your surgeon outlined in the "After Visit Summary".  RANGE OF MOTION AND STRENGTHENING EXERCISES  These exercises are designed to help you keep full movement of your hip joint. Follow your caregiver's or physical therapist's instructions. Perform all exercises about fifteen times, three times per day or as directed. Exercise both hips, even if you have had only one joint replacement. These exercises can be done on a training (exercise) mat, on the floor, on a table or on a bed. Use whatever works the best and is most comfortable for you. Use music or television while you are exercising so that the exercises are a pleasant break in your day. This will make your life better with the exercises acting as a break in routine you can look forward to.  Lying on your back, slowly slide your foot toward your buttocks, raising your knee up off the floor. Then slowly slide your foot back down until your leg is straight again.  Lying on your back spread your legs as far apart as you can without  causing discomfort.  Lying on your side, raise your upper leg and foot straight up from the floor as far as is comfortable. Slowly lower the leg and repeat.  Lying on your back, tighten up the muscle in the front of your thigh (quadriceps muscles). You can do this by keeping your leg straight and trying to raise your heel off the floor. This helps strengthen the largest muscle supporting your knee.  Lying on your back, tighten up the muscles of your buttocks both with the legs straight and with the knee bent at a comfortable angle while keeping your heel on the floor.   POST-OPERATIVE OPIOID TAPER INSTRUCTIONS: It is important to wean off of your opioid medication as soon as possible. If you do not need pain medication after your surgery it is ok to stop day one. Opioids include: Codeine, Hydrocodone(Norco, Vicodin), Oxycodone(Percocet, oxycontin) and hydromorphone amongst others.  Long term and even short term use of opiods can cause: Increased pain response Dependence Constipation Depression Respiratory depression And more.  Withdrawal symptoms can include Flu like symptoms Nausea, vomiting And more Techniques to manage these symptoms Hydrate well Eat regular healthy meals Stay active Use relaxation techniques(deep breathing, meditating, yoga) Do Not substitute Alcohol to help with tapering If you have been on opioids for less than two weeks and do not have pain than it is ok to stop all together.  Plan to wean off of opioids This plan should start within one week post op of your joint replacement. Maintain the same interval or time between taking each dose and first decrease the dose.  Cut the total daily intake of opioids by one tablet each day Next start to increase the time between doses. The last dose that should be eliminated is the evening dose.   IF YOU ARE TRANSFERRED TO A SKILLED REHAB FACILITY If the patient is transferred to a skilled rehab facility following release  from the hospital, a list of the current medications will be sent to the facility for the patient to continue.  When discharged from the skilled rehab facility, please have the facility set up the patient's Home Health Physical Therapy prior to being released. Also, the skilled facility will be responsible for providing the patient with their medications at time of release from the facility to include their pain medication, the muscle relaxants, and their blood thinner medication. If the patient is still at the  rehab facility at time of the two week follow up appointment, the skilled rehab facility will also need to assist the patient in arranging follow up appointment in our office and any transportation needs.  MAKE SURE YOU:  Understand these instructions.  Get help right away if you are not doing well or get worse.    DENTAL ANTIBIOTICS:  In most cases prophylactic antibiotics for Dental procdeures after total joint surgery are not necessary.  Exceptions are as follows:  1. History of prior total joint infection  2. Severely immunocompromised (Organ Transplant, cancer chemotherapy, Rheumatoid biologic meds such as Humera)  3. Poorly controlled diabetes (A1C &gt; 8.0, blood glucose over 200)  If you have one of these conditions, contact your surgeon for an antibiotic prescription, prior to your dental procedure.    Pick up stool softner and laxative for home use following surgery while on pain medications. Do not submerge incision under water. Please use good hand washing techniques while changing dressing each day. May shower starting three days after surgery. Please use a clean towel to pat the incision dry following showers. Continue to use ice for pain and swelling after surgery. Do not use any lotions or creams on the incision until instructed by your surgeon.

## 2023-02-09 NOTE — Interval H&P Note (Signed)
History and Physical Interval Note:  02/09/2023 12:11 PM  [name]  has presented today for surgery, with the diagnosis of left hip osteoarthritis.  The various methods of treatment have been discussed with the patient and family. After consideration of risks, benefits and other options for treatment, the patient has consented to  Procedure(s): TOTAL HIP ARTHROPLASTY ANTERIOR APPROACH (Left) as a surgical intervention.  The patient's history has been reviewed, patient examined, no change in status, stable for surgery.  I have reviewed the patient's chart and labs.  Questions were answered to the patient's satisfaction.     [name]

## 2023-02-09 NOTE — Op Note (Signed)
OPERATIVE REPORT- TOTAL HIP ARTHROPLASTY   PREOPERATIVE DIAGNOSIS: Osteoarthritis of the Left hip.   POSTOPERATIVE DIAGNOSIS: Osteoarthritis of the Left  hip.   PROCEDURE: Left total hip arthroplasty, anterior approach.   SURGEON: Ollen Gross, MD   ASSISTANT: Arther Abbott, PA-C  ANESTHESIA:  Spinal  ESTIMATED BLOOD LOSS:-450 mL    DRAINS: None  COMPLICATIONS: None   CONDITION: PACU - hemodynamically stable.   BRIEF CLINICAL NOTE: [name] is a 82 y.o. female who has advanced end-  stage arthritis of their Left  hip with progressively worsening pain and  dysfunction.The patient has failed nonoperative management and presents for  total hip arthroplasty.   PROCEDURE IN DETAIL: After successful administration of spinal  anesthetic, the traction boots for the [facility] bed were placed on both  feet and the patient was placed onto the [facility] bed, boots placed into the leg  holders. The Left hip was then isolated from the perineum with plastic  drapes and prepped and draped in the usual sterile fashion. ASIS and  greater trochanter were marked and a oblique incision was made, starting  at about 1 cm lateral and 2 cm distal to the ASIS and coursing towards  the anterior cortex of the femur. The skin was cut with a 10 blade  through subcutaneous tissue to the level of the fascia overlying the  tensor fascia lata muscle. The fascia was then incised in line with the  incision at the junction of the anterior third and posterior 2/3rd. The  muscle was teased off the fascia and then the interval between the TFL  and the rectus was developed. The Hohmann retractor was then placed at  the top of the femoral neck over the capsule. The vessels overlying the  capsule were cauterized and the fat on top of the capsule was removed.  A Hohmann retractor was then placed anterior underneath the rectus  femoris to give exposure to the entire anterior capsule. A T-shaped  capsulotomy  was performed. The edges were tagged and the femoral head  was identified.       Osteophytes are removed off the superior acetabulum.  The femoral neck was then cut in situ with an oscillating saw. Traction  was then applied to the left lower extremity utilizing the [facility]  traction. The femoral head was then removed. Retractors were placed  around the acetabulum and then circumferential removal of the labrum was  performed. Osteophytes were also removed. Reaming starts at 47 mm to  medialize and  Increased in 2 mm increments to 49 mm. We reamed in  approximately 40 degrees of abduction, 20 degrees anteversion. A 50 mm  pinnacle acetabular shell was then impacted in anatomic position under  fluoroscopic guidance with excellent purchase. We did not need to place  any additional dome screws. A 32 mm neutral + 4 marathon liner was then  placed into the acetabular shell.       The femoral lift was then placed along the lateral aspect of the femur  just distal to the vastus ridge. The leg was  externally rotated and capsule  was stripped off the inferior aspect of the femoral neck down to the  level of the lesser trochanter, this was done with electrocautery. The femur was lifted after this was performed. The  leg was then placed in an extended and adducted position essentially delivering the femur. We also removed the capsule superiorly and the piriformis from the piriformis fossa to  gain excellent exposure of the  proximal femur. Rongeur was used to remove some cancellous bone to get  into the lateral portion of the proximal femur for placement of the  initial starter reamer. The starter broaches was placed  the starter broach  and was shown to go down the center of the canal. Broaching  with the Actis system was then performed starting at size 0  coursing  Up to size 4. A size 4 had excellent torsional and rotational  and axial stability. The trial standard offset neck was then placed  with a 32  + 1 trial head. The hip was then reduced. We confirmed that  the stem was in the canal both on AP and lateral x-rays. It also has excellent sizing. The hip was reduced with outstanding stability through full extension and full external rotation.. AP pelvis was taken and the leg lengths were measured and found to be equal. Hip was then dislocated again and the femoral head and neck removed. The  femoral broach was removed. Size 4 Actis stem with a standard offset  neck was then impacted into the femur following native anteversion. Has  excellent purchase in the canal. Excellent torsional and rotational and  axial stability. It is confirmed to be in the canal on AP and lateral  fluoroscopic views. The 32 + 1 metal head was placed and the hip  reduced with outstanding stability. Again AP pelvis was taken and it  confirmed that the leg lengths were equal. The wound was then copiously  irrigated with saline solution and the capsule reattached and repaired  with Ethibond suture. 30 ml of .25% Bupivicaine was  injected into the capsule and into the edge of the tensor fascia lata as well as subcutaneous tissue. The fascia overlying the tensor fascia lata was then closed with a running #1 V-Loc. Subcu was closed with interrupted 2-0 Vicryl and subcuticular running 4-0 Monocryl. Incision was cleaned  and dried. Steri-Strips and a bulky sterile dressing applied. The patient was awakened and transported to  recovery in stable condition.        Please note that a surgical assistant was a medical necessity for this procedure to perform it in a safe and expeditious manner. Assistant was necessary to provide appropriate retraction of vital neurovascular structures and to prevent femoral fracture and allow for anatomic placement of the prosthesis.  Ollen Gross, M.D.

## 2023-02-10 ENCOUNTER — Other Ambulatory Visit (HOSPITAL_COMMUNITY): Payer: Self-pay

## 2023-02-10 ENCOUNTER — Encounter (HOSPITAL_COMMUNITY): Payer: Self-pay | Admitting: Orthopedic Surgery

## 2023-02-10 DIAGNOSIS — N189 Chronic kidney disease, unspecified: Secondary | ICD-10-CM | POA: Diagnosis not present

## 2023-02-10 DIAGNOSIS — M1612 Unilateral primary osteoarthritis, left hip: Secondary | ICD-10-CM | POA: Diagnosis not present

## 2023-02-10 DIAGNOSIS — E1122 Type 2 diabetes mellitus with diabetic chronic kidney disease: Secondary | ICD-10-CM | POA: Diagnosis not present

## 2023-02-10 DIAGNOSIS — I129 Hypertensive chronic kidney disease with stage 1 through stage 4 chronic kidney disease, or unspecified chronic kidney disease: Secondary | ICD-10-CM | POA: Diagnosis not present

## 2023-02-10 DIAGNOSIS — Z96653 Presence of artificial knee joint, bilateral: Secondary | ICD-10-CM | POA: Diagnosis not present

## 2023-02-10 DIAGNOSIS — Z79899 Other long term (current) drug therapy: Secondary | ICD-10-CM | POA: Diagnosis not present

## 2023-02-10 DIAGNOSIS — E039 Hypothyroidism, unspecified: Secondary | ICD-10-CM | POA: Diagnosis not present

## 2023-02-10 LAB — CBC
HCT: 28.3 % — ABNORMAL LOW (ref 36.0–46.0)
Hemoglobin: 9.8 g/dL — ABNORMAL LOW (ref 12.0–15.0)
MCH: 32.7 pg (ref 26.0–34.0)
MCHC: 34.6 g/dL (ref 30.0–36.0)
MCV: 94.3 fL (ref 80.0–100.0)
Platelets: 200 10*3/uL (ref 150–400)
RBC: 3 MIL/uL — ABNORMAL LOW (ref 3.87–5.11)
RDW: 13.9 % (ref 11.5–15.5)
WBC: 6.7 10*3/uL (ref 4.0–10.5)
nRBC: 0 % (ref 0.0–0.2)

## 2023-02-10 LAB — BASIC METABOLIC PANEL
Anion gap: 5 (ref 5–15)
BUN: 16 mg/dL (ref 8–23)
CO2: 26 mmol/L (ref 22–32)
Calcium: 8.7 mg/dL — ABNORMAL LOW (ref 8.9–10.3)
Chloride: 100 mmol/L (ref 98–111)
Creatinine, Ser: 1.4 mg/dL — ABNORMAL HIGH (ref 0.44–1.00)
GFR, Estimated: 38 mL/min — ABNORMAL LOW (ref >60)
Glucose, Bld: 162 mg/dL — ABNORMAL HIGH (ref 70–99)
Potassium: 4.1 mmol/L (ref 3.5–5.1)
Sodium: 131 mmol/L — ABNORMAL LOW (ref 135–145)

## 2023-02-10 MED ORDER — TRAMADOL HCL 50 MG PO TABS
50.0000 mg | ORAL_TABLET | Freq: Four times a day (QID) | ORAL | 0 refills | Status: DC | PRN
  Filled 2023-02-10: qty 30, 8d supply, fill #0

## 2023-02-10 MED ORDER — ASPIRIN 81 MG PO CHEW
81.0000 mg | CHEWABLE_TABLET | Freq: Two times a day (BID) | ORAL | 0 refills | Status: AC
  Filled 2023-02-10: qty 63, 32d supply, fill #0

## 2023-02-10 MED ORDER — METHOCARBAMOL 500 MG PO TABS
500.0000 mg | ORAL_TABLET | Freq: Four times a day (QID) | ORAL | 0 refills | Status: DC | PRN
  Filled 2023-02-10: qty 40, 10d supply, fill #0

## 2023-02-10 MED ORDER — HYDROCODONE-ACETAMINOPHEN 5-325 MG PO TABS
1.0000 | ORAL_TABLET | Freq: Four times a day (QID) | ORAL | 0 refills | Status: DC | PRN
  Filled 2023-02-10: qty 42, 6d supply, fill #0

## 2023-02-10 MED ORDER — ONDANSETRON HCL 4 MG PO TABS
4.0000 mg | ORAL_TABLET | Freq: Four times a day (QID) | ORAL | 0 refills | Status: AC | PRN
  Filled 2023-02-10: qty 20, 5d supply, fill #0

## 2023-02-10 NOTE — Anesthesia Postprocedure Evaluation (Signed)
Anesthesia Post Note  Patient: Ivannah L Sposito  Procedure(s) Performed: TOTAL HIP ARTHROPLASTY ANTERIOR APPROACH (Left: Hip)     Patient location during evaluation: PACU Anesthesia Type: MAC and Spinal Level of consciousness: oriented and awake and alert Pain management: pain level controlled Vital Signs Assessment: post-procedure vital signs reviewed and stable Respiratory status: spontaneous breathing, respiratory function stable and patient connected to nasal cannula oxygen Cardiovascular status: blood pressure returned to baseline and stable Postop Assessment: no headache, no backache and no apparent nausea or vomiting Anesthetic complications: no   No notable events documented.  Last Vitals:  Vitals:   02/10/23 0155 02/10/23 0552  BP: 119/60 (!) 121/59  Pulse: (!) 59 (!) 58  Resp: 17 17  Temp: 36.4 C 36.6 C  SpO2: 99% 96%    Last Pain:  Vitals:   02/10/23 0635  TempSrc:   PainSc: 4                  Ruble Pumphrey S

## 2023-02-10 NOTE — Evaluation (Signed)
Physical Therapy Brief Evaluation and Discharge Note Patient Details Name: [name] MRN: [id number] DOB: [birth year] Today's Date: 02/10/2023   History of Present Illness  82 y.o. admitted for L  AA-THA. PMH: B TKA, L reverse TSA, DM, CKD  Clinical Impression  Pt is mobilizing well, she ambulated 42' with RW, no loss of balance. Stair training completed. Pt demonstrates good understanding of HEP. She is ready to DC home from a PT standpoint.        PT Assessment    Assistance Needed at Discharge       Equipment Recommendations Rolling walker (2 wheels)  Recommendations for Other Services       Precautions/Restrictions Precautions Precautions: Fall Precaution Comments: denies falls in past 6 months Restrictions Weight Bearing Restrictions: No LLE Weight Bearing: Weight bearing as tolerated        Mobility  Bed Mobility          Transfers Overall transfer level: Needs assistance Equipment used: Rolling walker (2 wheels) Transfers: Sit to/from Stand Sit to Stand: Contact guard assist           General transfer comment: VCs hand placement    Ambulation/Gait Ambulation/Gait assistance: Supervision Gait Distance (Feet): 90 Feet Assistive device: Rolling walker (2 wheels) Gait Pattern/deviations: Step-to pattern   General Gait Details: steady, no loss of balance, VCs sequencing initially  Home Activity Instructions    Stairs Stairs: Yes Stairs assistance: Contact guard assist Stair Management: One rail Left, With cane Number of Stairs: 3 General stair comments: VCs sequencing  Modified Rankin (Stroke Patients Only)        Balance                          Pertinent Vitals/Pain   Pain Assessment Pain Assessment: 0-10 Pain Score: 7  Pain Location: L hip Pain Descriptors / Indicators: Sore Pain Intervention(s): Limited activity within patient's tolerance, Monitored during session, Premedicated before session, Ice applied      Home Living   Living Arrangements: Children       Home Equipment: Cane - single point;Grab bars - toilet   Additional Comments: lives with son but will DC to daughter's home, info above is for daughter's home    Prior Function        UE/LE Assessment               Communication   Communication Communication: No apparent difficulties     Cognition         General Comments      Exercises Total Joint Exercises Ankle Circles/Pumps: AROM, Both, 15 reps, Supine Quad Sets: AROM, Both, 5 reps, Supine Short Arc Quad: AROM, Left, 5 reps, Supine Heel Slides: AAROM, Left, 10 reps, Supine Hip ABduction/ADduction: AAROM, Left, 10 reps, Supine   Assessment/Plan    PT Problem List         PT Visit Diagnosis      No Skilled PT Patient is supervision for all activity/mobility;Patient will have necessary level of assist by caregiver at discharge   Co-evaluation                AMPAC 6 Clicks Help needed turning from your back to your side while in a flat bed without using bedrails?: None Help needed moving from lying on your back to sitting on the side of a flat bed without using bedrails?: A Little Help needed moving to and from a bed to a chair (including  a wheelchair)?: None Help needed standing up from a chair using your arms (e.g., wheelchair or bedside chair)?: None Help needed to walk in hospital room?: None Help needed climbing 3-5 steps with a railing? : A Little 6 Click Score: 22      End of Session Equipment Utilized During Treatment: Gait belt Activity Tolerance: Patient tolerated treatment well Patient left: in chair;with call bell/phone within reach;with chair alarm set Nurse Communication: Mobility status       Time: 2130-8657 PT Time Calculation (min) (ACUTE ONLY): 25 min  Charges:   PT Evaluation $PT Eval Moderate Complexity: 1 Mod PT Treatments $Gait Training: 8-22 mins    Ralene Bathe Kistler PT 02/10/2023  Acute  Rehabilitation Services  Office [phone]

## 2023-02-10 NOTE — Plan of Care (Signed)
  Problem: Activity: Goal: Ability to avoid complications of mobility impairment will improve Outcome: Progressing   Problem: Education: Goal: Knowledge of the prescribed therapeutic regimen will improve Outcome: Progressing   Problem: Clinical Measurements: Goal: Postoperative complications will be avoided or minimized Outcome: Progressing   Problem: Pain Management: Goal: Pain level will decrease with appropriate interventions Outcome: Progressing   Problem: Skin Integrity: Goal: Will show signs of wound healing Outcome: Progressing

## 2023-02-10 NOTE — Progress Notes (Signed)
Provided discharge education/instructions, all questions and concerns addressed. Pt is not in any distress. Pt is to discharge home with all of her belongings accompanied by daughter.

## 2023-02-10 NOTE — TOC Transition Note (Signed)
Transition of Care [facility]) - CM/SW Discharge Note   Patient Details  Name: [name] MRN: [id number] Date of Birth: 1940/03/25  Transition of Care [facility]) CM/SW Contact:  Amada Jupiter, LCSW Phone Number: 02/10/2023, 10:19 AM   Clinical Narrative:     Met with pt who confirms need for RW and no DME agency preference - order placed with [employer] for delivery to room prior to dc.  Plan for HEP.  No further TOC needs.  Final next level of care: Home/Self Care Barriers to Discharge: No Barriers Identified   Patient Goals and CMS Choice      Discharge Placement                         Discharge Plan and Services Additional resources added to the After Visit Summary for                  DME Arranged: Walker rolling DME Agency: [employer] Date DME Agency Contacted: 02/10/23 Time DME Agency Contacted: [phone] Representative spoke with at DME Agency: Ian Malkin            Social Determinants of Health (SDOH) Interventions SDOH Screenings   Food Insecurity: No Food Insecurity (02/09/2023)  Housing: Low Risk  (02/09/2023)  Transportation Needs: No Transportation Needs (02/09/2023)  Utilities: Not At Risk (02/09/2023)  Alcohol Screen: Low Risk  (02/26/2022)  Depression (PHQ2-9): Low Risk  (04/18/2022)  Financial Resource Strain: Medium Risk (02/26/2022)  Physical Activity: Inactive (02/26/2022)  Social Connections: Moderately Isolated (02/26/2022)  Stress: No Stress Concern Present (02/26/2022)  Tobacco Use: Low Risk  (02/09/2023)     Readmission Risk Interventions     No data to display

## 2023-02-10 NOTE — Progress Notes (Signed)
   Subjective: 1 Day Post-Op Procedure(s) (LRB): TOTAL HIP ARTHROPLASTY ANTERIOR APPROACH (Left) Patient reports pain as mild.   Patient seen in rounds by Dr. Lequita Halt. Patient is well, and has had no acute complaints or problems. Denies chest pain or SOB. No issues overnight. Foley catheter removed this AM. We will begin therapy today  Objective: Vital signs in last 24 hours: Temp:  [97.5 F (36.4 C)-98 F (36.7 C)] 97.8 F (36.6 C) (12/03 0552) Pulse Rate:  [53-69] 58 (12/03 0552) Resp:  [11-18] 17 (12/03 0552) BP: (101-166)/(45-87) 121/59 (12/03 0552) SpO2:  [96 %-100 %] 96 % (12/03 0552) Weight:  [93 kg-93.1 kg] 93.1 kg (12/02 1205)  Intake/Output from previous day:  Intake/Output Summary (Last 24 hours) at 02/10/2023 0836 Last data filed at 02/10/2023 5638 Gross per 24 hour  Intake 3273.66 ml  Output 2150 ml  Net 1123.66 ml     Intake/Output this shift: No intake/output data recorded.  Labs: Recent Labs    02/10/23 0339  HGB 9.8*   Recent Labs    02/10/23 0339  WBC 6.7  RBC 3.00*  HCT 28.3*  PLT 200   Recent Labs    02/10/23 0339  NA 131*  K 4.1  CL 100  CO2 26  BUN 16  CREATININE 1.40*  GLUCOSE 162*  CALCIUM 8.7*   No results for input(s): "LABPT", "INR" in the last 72 hours.  Exam: General - Patient is Alert and Oriented Extremity - Neurologically intact Neurovascular intact Sensation intact distally Dorsiflexion/Plantar flexion intact Dressing - dressing C/D/I Motor Function - intact, moving foot and toes well on exam.   Past Medical History:  Diagnosis Date   Anxiety    Arthritis    ostearthritis. Degenerative disc disease- back, knees, hands   Back pain    Cataract 11/2016   hx - surgery to remove   Hyperlipidemia    Hypertension    Hypothyroidism    Pre-diabetes    Renal insufficiency    Renal insufficiency    Rotator cuff disorder    Pain with limited ROM right, spur left shoulder   Thyroid disease      Assessment/Plan: 1 Day Post-Op Procedure(s) (LRB): TOTAL HIP ARTHROPLASTY ANTERIOR APPROACH (Left) Principal Problem:   OA (osteoarthritis) of hip Active Problems:   Primary osteoarthritis of left hip  Estimated body mass index is 35.22 kg/m as calculated from the following:   Height as of this encounter: 5\' 4"  (1.626 m).   Weight as of this encounter: 93.1 kg. Advance diet Up with therapy D/C IV fluids  DVT Prophylaxis - Aspirin Weight bearing as tolerated. Continue therapy.  Plan is to go Home after hospital stay.  Possible discharge later today if progresses with therapy and is meeting her goals. Given patient's age and overall mobility, will have low threshold for keeping an additional night.   Follow-up in the office in 2 weeks.  The PDMP database was reviewed today prior to any opioid medications being prescribed to this patient.  Arther Abbott, PA-C Orthopedic Surgery [phone] 02/10/2023, 8:36 AM

## 2023-02-11 NOTE — Discharge Summary (Signed)
Patient ID: [name] MRN: [id number] DOB/AGE: 11/19/1940 82 y.o.  Admit date: 02/09/2023 Discharge date: 02/10/2023  Admission Diagnoses:  Principal Problem:   OA (osteoarthritis) of hip Active Problems:   Primary osteoarthritis of left hip   Discharge Diagnoses:  Same  Past Medical History:  Diagnosis Date   Anxiety    Arthritis    ostearthritis. Degenerative disc disease- back, knees, hands   Back pain    Cataract 11/2016   hx - surgery to remove   Hyperlipidemia    Hypertension    Hypothyroidism    Pre-diabetes    Renal insufficiency    Renal insufficiency    Rotator cuff disorder    Pain with limited ROM right, spur left shoulder   Thyroid disease     Surgeries: Procedure(s): TOTAL HIP ARTHROPLASTY ANTERIOR APPROACH on 02/09/2023   Consultants:   Discharged Condition: Improved  Hospital Course: [name] is an 82 y.o. female who was admitted 02/09/2023 for operative treatment ofOA (osteoarthritis) of hip. Patient has severe unremitting pain that affects sleep, daily activities, and work/hobbies. After pre-op clearance the patient was taken to the operating room on 02/09/2023 and underwent  Procedure(s): TOTAL HIP ARTHROPLASTY ANTERIOR APPROACH.    Patient was given perioperative antibiotics:  Anti-infectives (From admission, onward)    Start     Dose/Rate Route Frequency Ordered Stop   02/09/23 2000  ceFAZolin (ANCEF) IVPB 2g/100 mL premix        2 g 200 mL/hr over 30 Minutes Intravenous Every 6 hours 02/09/23 1733 02/10/23 1323   02/09/23 1130  ceFAZolin (ANCEF) IVPB 2g/100 mL premix        2 g 200 mL/hr over 30 Minutes Intravenous On call to O.R. 02/09/23 1126 02/09/23 1439        Patient was given sequential compression devices, early ambulation, and chemoprophylaxis to prevent DVT.  Patient benefited maximally from hospital stay and there were no complications.    Recent vital signs: Patient Vitals for the past 24 hrs:  BP Temp Temp src  Pulse Resp SpO2  02/10/23 1330 139/72 -- -- (!) 55 -- --  02/10/23 0858 (!) 140/58 97.9 F (36.6 C) Oral 63 17 97 %     Recent laboratory studies:  Recent Labs    02/10/23 0339  WBC 6.7  HGB 9.8*  HCT 28.3*  PLT 200  NA 131*  K 4.1  CL 100  CO2 26  BUN 16  CREATININE 1.40*  GLUCOSE 162*  CALCIUM 8.7*     Discharge Medications:   Allergies as of 02/10/2023       Reactions   Prinivil [lisinopril] Cough        Medication List     STOP taking these medications    cyclobenzaprine 10 MG tablet Commonly known as: FLEXERIL   meloxicam 15 MG tablet Commonly known as: MOBIC   nabumetone 500 MG tablet Commonly known as: Relafen       TAKE these medications    amLODipine 5 MG tablet Commonly known as: NORVASC TAKE 1 TABLET BY MOUTH EVERY DAY Notes to patient: Resume home regimen   Aspirin Low Dose 81 MG chewable tablet Generic drug: aspirin Chew 1 tablet (81 mg total) by mouth 2 (two) times daily for 20 days. Then take one 81 mg aspirin once a day for three weeks. Then discontinue aspirin.   HYDROcodone-acetaminophen 5-325 MG tablet Commonly known as: NORCO/VICODIN Take 1-2 tablets by mouth every 6 (six) hours as needed for severe  pain (pain score 7-10). Notes to patient: Last dose given 12/03 09:07am   levothyroxine 75 MCG tablet Commonly known as: SYNTHROID Take 75 mcg by mouth every morning. What changed: Another medication with the same name was removed. Continue taking this medication, and follow the directions you see here.   methocarbamol 500 MG tablet Commonly known as: ROBAXIN Take 1 tablet (500 mg total) by mouth every 6 (six) hours as needed for muscle spasms. Notes to patient: Last dose given 12/03 09:01pm   metoprolol succinate 25 MG 24 hr tablet Commonly known as: TOPROL-XL TAKE 1 TABLET BY MOUTH DAILY   ondansetron 4 MG tablet Commonly known as: ZOFRAN Take 1 tablet (4 mg total) by mouth every 6 (six) hours as needed for nausea.    potassium chloride 10 MEQ tablet Commonly known as: KLOR-CON TAKE 1 TABLET BY MOUTH ONCE DAILY   simvastatin 20 MG tablet Commonly known as: ZOCOR TAKE 1 TABLET BY MOUTH AT BEDTIME   torsemide 20 MG tablet Commonly known as: DEMADEX TAKE 1 TABLET BY MOUTH DAILY   traMADol 50 MG tablet Commonly known as: ULTRAM Take 1 tablet (50 mg total) by mouth every 6 (six) hours as needed. Notes to patient: Last dose given 12/03 03:02am               Discharge Care Instructions  (From admission, onward)           Start     Ordered   02/10/23 0000  Weight bearing as tolerated        02/10/23 0839   02/10/23 0000  Change dressing       Comments: You have an adhesive waterproof bandage over the incision. Leave this in place until your first follow-up appointment. Once you remove this you will not need to place another bandage.   02/10/23 0839            Diagnostic Studies: DG Pelvis Portable  Result Date: 02/09/2023 CLINICAL DATA:  Status post left hip arthroplasty. EXAM: PORTABLE PELVIS 1-2 VIEWS COMPARISON:  None Available. FINDINGS: Left hip arthroplasty in expected alignment. No periprosthetic lucency or fracture. Recent postsurgical change includes air and edema in the soft tissues. Previous right hip arthroplasty. IMPRESSION: Left hip arthroplasty without immediate postoperative complication. Electronically Signed   By: Narda Rutherford M.D.   On: 02/09/2023 18:14   DG HIP UNILAT WITH PELVIS 1V LEFT  Result Date: 02/09/2023 CLINICAL DATA:  Intraoperative fluoroscopy for total left hip arthroplasty. EXAM: DG HIP (WITH OR WITHOUT PELVIS) 1V*L* COMPARISON:  Pelvis and bilateral hip radiographs 09/30/2007, AP right hip 10/09/2008, frontal view of the bilateral hips 10/09/2008 FINDINGS: Images were performed intraoperatively without the presence of a radiologist. Severe superior left femoroacetabular joint space narrowing, subchondral sclerosis, and subchondral cystic change.  The patient is undergoing new total left hip arthroplasty. Redemonstration of prior total right hip arthroplasty. Moderate severe pubic symphysis joint space narrowing with mild superior osteophytosis. Bilateral tubal ligation clips are noted. No hardware complication is seen. Total fluoroscopy images: 7 Total fluoroscopy time: 7 seconds Total dose: Radiation Exposure Index (as provided by the fluoroscopic device): 0.961 mGy air Kerma Please see intraoperative findings for further detail. IMPRESSION: Intraoperative fluoroscopy for total left hip arthroplasty. Electronically Signed   By: Neita Garnet M.D.   On: 02/09/2023 17:52   DG C-Arm 1-60 Min-No Report  Result Date: 02/09/2023 Fluoroscopy was utilized by the requesting physician.  No radiographic interpretation.    Disposition: Discharge disposition: 01-Home or Self  Care       Discharge Instructions     Call MD / Call 911   Complete by: As directed    If you experience chest pain or shortness of breath, CALL 911 and be transported to the hospital emergency room.  If you develope a fever above 101 F, pus (white drainage) or increased drainage or redness at the wound, or calf pain, call your surgeon's office.   Change dressing   Complete by: As directed    You have an adhesive waterproof bandage over the incision. Leave this in place until your first follow-up appointment. Once you remove this you will not need to place another bandage.   Constipation Prevention   Complete by: As directed    Drink plenty of fluids.  Prune juice may be helpful.  You may use a stool softener, such as Colace (over the counter) 100 mg twice a day.  Use MiraLax (over the counter) for constipation as needed.   Diet - low sodium heart healthy   Complete by: As directed    Do not sit on low chairs, stoools or toilet seats, as it may be difficult to get up from low surfaces   Complete by: As directed    Driving restrictions   Complete by: As directed    No  driving for two weeks   Post-operative opioid taper instructions:   Complete by: As directed    POST-OPERATIVE OPIOID TAPER INSTRUCTIONS: It is important to wean off of your opioid medication as soon as possible. If you do not need pain medication after your surgery it is ok to stop day one. Opioids include: Codeine, Hydrocodone(Norco, Vicodin), Oxycodone(Percocet, oxycontin) and hydromorphone amongst others.  Long term and even short term use of opiods can cause: Increased pain response Dependence Constipation Depression Respiratory depression And more.  Withdrawal symptoms can include Flu like symptoms Nausea, vomiting And more Techniques to manage these symptoms Hydrate well Eat regular healthy meals Stay active Use relaxation techniques(deep breathing, meditating, yoga) Do Not substitute Alcohol to help with tapering If you have been on opioids for less than two weeks and do not have pain than it is ok to stop all together.  Plan to wean off of opioids This plan should start within one week post op of your joint replacement. Maintain the same interval or time between taking each dose and first decrease the dose.  Cut the total daily intake of opioids by one tablet each day Next start to increase the time between doses. The last dose that should be eliminated is the evening dose.      TED hose   Complete by: As directed    Use stockings (TED hose) for three weeks on both leg(s).  You may remove them at night for sleeping.   Weight bearing as tolerated   Complete by: As directed         Follow-up Information     Ollen Gross, MD. Go on 02/24/2023.   Specialty: Orthopedic Surgery Why: You are scheduled for first post op appt on Tuesday December 17 at 1:45pm. Contact information: [address] [place] [address] [place] [address] [phone]                  Signed: Arther Abbott 02/11/2023, 8:32 AM

## 2023-02-18 ENCOUNTER — Ambulatory Visit (INDEPENDENT_AMBULATORY_CARE_PROVIDER_SITE_OTHER): Payer: Medicare HMO

## 2023-02-18 VITALS — Ht 62.25 in | Wt 209.0 lb

## 2023-02-18 DIAGNOSIS — Z0000 Encounter for general adult medical examination without abnormal findings: Secondary | ICD-10-CM | POA: Diagnosis not present

## 2023-02-18 NOTE — Progress Notes (Signed)
Subjective:   [name] is a 82 y.o. female who presents for Medicare Annual (Subsequent) preventive examination.  Visit Complete: Virtual I connected with  Lenord Fellers on 02/18/23 by a audio enabled telemedicine application and verified that I am speaking with the correct person using two identifiers.  Patient Location: Home  Provider Location: Office/Clinic  I discussed the limitations of evaluation and management by telemedicine. The patient expressed understanding and agreed to proceed.  Vital Signs: Because this visit was a virtual/telehealth visit, some criteria may be missing or patient reported. Any vitals not documented were not able to be obtained and vitals that have been documented are patient reported.  Patient Medicare AWV questionnaire was completed by the patient on 02/18/2023; I have confirmed that all information answered by patient is correct and no changes since this date.  Cardiac Risk Factors include: advanced age (>49men, >34 women);diabetes mellitus;dyslipidemia;hypertension     Objective:    Today's Vitals   02/18/23 1114 02/18/23 1159  Weight: 209 lb (94.8 kg)   Height: 5' 2.25" (1.581 m)   PainSc: 0-No pain 0-No pain   Body mass index is 37.92 kg/m.     02/18/2023   12:08 PM 02/09/2023    6:00 PM 02/09/2023   12:04 PM 01/27/2023    8:26 AM 02/26/2022    3:00 PM 02/22/2021   10:12 AM 01/26/2017    3:41 PM  Advanced Directives  Does Patient Have a Medical Advance Directive? No No No No No No No  Would patient like information on creating a medical advance directive? No - Patient declined No - Patient declined No - Patient declined  Yes (ED - Information included in AVS) Yes (MAU/Ambulatory/Procedural Areas - Information given) No - Patient declined    Current Medications (verified) Outpatient Encounter Medications as of 02/18/2023  Medication Sig   amLODipine (NORVASC) 5 MG tablet TAKE 1 TABLET BY MOUTH EVERY DAY   aspirin 81 MG chewable  tablet Chew 1 tablet (81 mg total) by mouth 2 (two) times daily for 20 days. Then take one 81 mg aspirin once a day for three weeks. Then discontinue aspirin.   HYDROcodone-acetaminophen (NORCO/VICODIN) 5-325 MG tablet Take 1-2 tablets by mouth every 6 (six) hours as needed for severe pain (pain score 7-10).   levothyroxine (SYNTHROID) 75 MCG tablet Take 75 mcg by mouth every morning.   methocarbamol (ROBAXIN) 500 MG tablet Take 1 tablet (500 mg total) by mouth every 6 (six) hours as needed for muscle spasms.   metoprolol succinate (TOPROL-XL) 25 MG 24 hr tablet TAKE 1 TABLET BY MOUTH DAILY   ondansetron (ZOFRAN) 4 MG tablet Take 1 tablet (4 mg total) by mouth every 6 (six) hours as needed for nausea.   potassium chloride (KLOR-CON) 10 MEQ tablet TAKE 1 TABLET BY MOUTH ONCE DAILY   simvastatin (ZOCOR) 20 MG tablet TAKE 1 TABLET BY MOUTH AT BEDTIME   torsemide (DEMADEX) 20 MG tablet TAKE 1 TABLET BY MOUTH DAILY   traMADol (ULTRAM) 50 MG tablet Take 1 tablet (50 mg total) by mouth every 6 (six) hours as needed.   No facility-administered encounter medications on file as of 02/18/2023.    Allergies (verified) Prinivil [lisinopril]   History: Past Medical History:  Diagnosis Date   Anxiety    Arthritis    ostearthritis. Degenerative disc disease- back, knees, hands   Back pain    Cataract 11/2016   hx - surgery to remove   Hyperlipidemia    Hypertension  Hypothyroidism    Pre-diabetes    Renal insufficiency    Renal insufficiency    Rotator cuff disorder    Pain with limited ROM right, spur left shoulder   Thyroid disease    Past Surgical History:  Procedure Laterality Date   CATARACT EXTRACTION, BILATERAL Bilateral 11/2016   INJECTION KNEE Right 07/16/2015   Procedure: RIGHT KNEE INJECTION;  Surgeon: Ollen Gross, MD;  Location: [facility];  Service: Orthopedics;  Laterality: Right;   JOINT REPLACEMENT Right 10/2008   RTHA-hip   REVERSE SHOULDER ARTHROPLASTY Left 08/15/2016    Procedure: REVERSE LEFT SHOULDER ARTHROPLASTY;  Surgeon: Beverely Low, MD;  Location: [facility] OR;  Service: Orthopedics;  Laterality: Left;   TOTAL HIP ARTHROPLASTY Left 02/09/2023   Procedure: TOTAL HIP ARTHROPLASTY ANTERIOR APPROACH;  Surgeon: Ollen Gross, MD;  Location: [facility];  Service: Orthopedics;  Laterality: Left;   TOTAL KNEE ARTHROPLASTY Left 07/16/2015   Procedure: LEFT TOTAL KNEE ARTHROPLASTY;  Surgeon: Ollen Gross, MD;  Location: [facility];  Service: Orthopedics;  Laterality: Left;   TOTAL KNEE ARTHROPLASTY Right 01/26/2017   Procedure: RIGHT TOTAL KNEE ARTHROPLASTY;  Surgeon: Ollen Gross, MD;  Location: [facility];  Service: Orthopedics;  Laterality: Right;   Family History  Problem Relation Age of Onset   Hypertension Mother    Stroke Mother    Cancer Father        Prostate cancer   Cancer Brother        Lung cancer   Hypertension Sister    Hypertension Child    Diabetes Child    Colon cancer Neg Hx    Esophageal cancer Neg Hx    Stomach cancer Neg Hx    Pancreatic cancer Neg Hx    Social History   Socioeconomic History   Marital status: Widowed    Spouse name: Not on file   Number of children: Not on file   Years of education: Not on file   Highest education level: Not on file  Occupational History   Not on file  Tobacco Use   Smoking status: Never   Smokeless tobacco: Never  Vaping Use   Vaping status: Never Used  Substance and Sexual Activity   Alcohol use: No   Drug use: No   Sexual activity: Not Currently    Birth control/protection: Post-menopausal  Other Topics Concern   Not on file  Social History Narrative   Not on file   Social Determinants of Health   Financial Resource Strain: Medium Risk (02/26/2022)   Overall Financial Resource Strain (CARDIA)    Difficulty of Paying Living Expenses: Somewhat hard  Food Insecurity: No Food Insecurity (02/09/2023)   Hunger Vital Sign    Worried About Running Out of Food in the Last Year: Never true    Ran  Out of Food in the Last Year: Never true  Transportation Needs: No Transportation Needs (02/09/2023)   PRAPARE - Administrator, Civil Service (Medical): No    Lack of Transportation (Non-Medical): No  Physical Activity: Inactive (02/26/2022)   Exercise Vital Sign    Days of Exercise per Week: 0 days    Minutes of Exercise per Session: 0 min  Stress: No Stress Concern Present (02/26/2022)   [employer] of Occupational Health - Occupational Stress Questionnaire    Feeling of Stress : Not at all  Social Connections: Moderately Isolated (02/26/2022)   Social Connection and Isolation Panel [NHANES]    Frequency of Communication with Friends and Family: More than  three times a week    Frequency of Social Gatherings with Friends and Family: Once a week    Attends Religious Services: 1 to 4 times per year    Active Member of [employer] or Organizations: No    Attends Banker Meetings: Never    Marital Status: Widowed    Tobacco Counseling Counseling given: Not Answered   Clinical Intake:  Pre-visit preparation completed: Yes  Pain : No/denies pain Pain Score: 0-No pain     BMI - recorded: 37.92 Nutritional Status: BMI > 30  Obese Nutritional Risks: None Diabetes: Yes  How often do you need to have someone help you when you read instructions, pamphlets, or other written materials from your doctor or pharmacy?: 1 - Never  Interpreter Needed?: No  Information entered by :: Dixie Dials, CMA   Activities of Daily Living    02/18/2023   12:03 PM 02/09/2023    6:00 PM  In your present state of health, do you have any difficulty performing the following activities:  Hearing? 1 1  Vision? 0 0  Difficulty concentrating or making decisions? 0 0  Walking or climbing stairs? 1   Dressing or bathing? 1   Doing errands, shopping? 1 0  Preparing Food and eating ? N   Using the Toilet? N   In the past six months, have you accidently leaked urine? N    Do you have problems with loss of bowel control? N   Managing your Medications? N   Managing your Finances? N   Housekeeping or managing your Housekeeping? N     Patient Care Team: Margaree Mackintosh, MD as PCP - General (Internal Medicine)  Indicate any recent Medical Services you may have received from other than [facility] providers in the past year (date may be approximate).     Assessment:   This is a routine wellness examination for [facility].  Hearing/Vision screen No results found.   Goals Addressed   None    Depression Screen    04/18/2022    9:34 AM 02/26/2022    3:08 PM 02/22/2021   10:12 AM 08/20/2020    9:52 AM 02/17/2019    9:59 AM 01/04/2018   10:24 AM 01/02/2017   10:13 AM  PHQ 2/9 Scores  PHQ - 2 Score 0 0 0 0 0 0 0    Fall Risk    02/18/2023   12:05 PM 04/18/2022    9:34 AM 02/26/2022    3:01 PM 02/22/2021   10:12 AM 08/20/2020    9:52 AM  Fall Risk   Falls in the past year? 0 0 0 0 0  Number falls in past yr: 0 0 0 0 0  Injury with Fall? 0 0 0 0 0  Risk for fall due to : No Fall Risks No Fall Risks No Fall Risks Impaired balance/gait   Follow up Falls evaluation completed;Education provided;Falls prevention discussed Falls prevention discussed Falls prevention discussed Falls evaluation completed Falls evaluation completed    MEDICARE RISK AT HOME: Medicare Risk at Home Any stairs in or around the home?: Yes If so, are there any without handrails?: No Home free of loose throw rugs in walkways, pet beds, electrical cords, etc?: Yes Adequate lighting in your home to reduce risk of falls?: Yes Life alert?: No Use of a cane, walker or w/c?: Yes Grab bars in the bathroom?: No Shower chair or bench in shower?: No Elevated toilet seat or a handicapped toilet?: No  TIMED  UP AND GO:  Was the test performed?  No    Cognitive Function:        02/18/2023   12:05 PM 02/22/2021   10:14 AM  6CIT Screen  What Year? 0 points 0 points  What month? 0 points 0  points  What time? 0 points 0 points  Count back from 20 0 points 0 points  Months in reverse 0 points 4 points  Repeat phrase 6 points 0 points  Total Score 6 points 4 points    Immunizations Immunization History  Administered Date(s) Administered   Fluad Quad(high Dose 65+) 11/17/2022   Influenza Inj Mdck Quad Pf 12/28/2018   Influenza,inj,Quad PF,6+ Mos 12/01/2014, 12/24/2015, 01/02/2017, 01/04/2018   Influenza-Unspecified 12/30/2019, 12/08/2020   PFIZER(Purple Top)SARS-COV-2 Vaccination 04/01/2019, 04/22/2019, 12/08/2019, 06/13/2020   Pfizer Covid-19 Vaccine Bivalent Booster 60yrs & up 12/08/2020   Pneumococcal Conjugate-13 07/03/2014   Pneumococcal Polysaccharide-23 12/01/2011   Respiratory Syncytial Virus Vaccine,Recomb Aduvanted(Arexvy) 10/16/2021   Tdap 10/21/2010, 05/24/2021   Zoster Recombinant(Shingrix) 05/24/2021   Zoster, Unspecified 09/21/2021    TDAP status: Up to date  Flu Vaccine status: Up to date  Pneumococcal vaccine status: Up to date  Covid-19 vaccine status: Completed vaccines  Qualifies for Shingles Vaccine? Yes   Zostavax completed Yes   Shingrix Completed?: No.    Education has been provided regarding the importance of this vaccine. Patient has been advised to call insurance company to determine out of pocket expense if they have not yet received this vaccine. Advised may also receive vaccine at local pharmacy or Health Dept. Verbalized acceptance and understanding.  Screening Tests Health Maintenance  Topic Date Due   Medicare Annual Wellness (AWV)  02/27/2023   Colonoscopy  04/19/2023 (Originally 12/18/2021)   OPHTHALMOLOGY EXAM  08/07/2023 (Originally 06/04/2019)   Diabetic kidney evaluation - Urine ACR  04/19/2023   FOOT EXAM  04/19/2023   HEMOGLOBIN A1C  06/25/2023   Diabetic kidney evaluation - eGFR measurement  02/10/2024   DTaP/Tdap/Td (3 - Td or Tdap) 05/25/2031   Pneumonia Vaccine 105+ Years old  Completed   INFLUENZA VACCINE   Completed   Zoster Vaccines- Shingrix  Completed   HPV VACCINES  Aged Out   DEXA SCAN  Discontinued   COVID-19 Vaccine  Discontinued    Health Maintenance  Health Maintenance Due  Topic Date Due   Medicare Annual Wellness (AWV)  02/27/2023    Colorectal cancer screening: Referral to GI placed [facility]. Pt aware the office will call re: appt.  Mammogram status: Ordered 02/18/23. Pt provided with contact info and advised to call to schedule appt.   Bone Density status: Ordered 12/11/243. Pt provided with contact info and advised to call to schedule appt.  Lung Cancer Screening: (Low Dose CT Chest recommended if Age 106-80 years, 20 pack-year currently smoking OR have quit w/in 15years.) does not qualify.  3  Additional Screening:  Hepatitis C Screening: does not qualify;   Vision Screening: Recommended annual ophthalmology exams for early detection of glaucoma and other disorders of the eye. Is the patient up to date with their annual eye exam?  No  Who is the provider or what is the name of the office in which the patient attends annual eye exams? "Daughter will find a doctor"  If pt is not established with a provider, would they like to be referred to a provider to establish care? No .   Dental Screening: Recommended annual dental exams for proper oral hygiene  Diabetic Foot Exam:  Diabetic Foot Exam: Overdue, Pt has been advised about the importance in completing this exam. Pt is scheduled for diabetic foot exam on 06/15/23.  Community Resource Referral / Chronic Care Management: CRR required this visit?  No   CCM required this visit?  No     Plan:     I have personally reviewed and noted the following in the patient's chart:   Medical and social history Use of alcohol, tobacco or illicit drugs  Current medications and supplements including opioid prescriptions. Patient is currently taking opioid prescriptions. Information provided to patient regarding non-opioid  alternatives. Patient advised to discuss non-opioid treatment plan with their provider. Functional ability and status Nutritional status Physical activity Advanced directives List of other physicians Hospitalizations, surgeries, and ER visits in previous 12 months Vitals Screenings to include cognitive, depression, and falls Referrals and appointments  In addition, I have reviewed and discussed with patient certain preventive protocols, quality metrics, and best practice recommendations. A written personalized care plan for preventive services as well as general preventive health recommendations were provided to patient.     Malyah Ohlrich Sharlyne Cai, CMA   02/18/2023   After Visit Summary: (Mail) Due to this being a telephonic visit, the after visit summary with patients personalized plan was offered to patient via mail

## 2023-02-18 NOTE — Patient Instructions (Signed)
Next appointment: Follow up in one year for your annual wellness visit.    Preventive Care 82 Years and Older, Female Preventive care refers to lifestyle choices and visits with your health care provider that can promote health and wellness. What does preventive care include? A yearly physical exam. This is also called an annual well check. Dental exams once or twice a year. Routine eye exams. Ask your health care provider how often you should have your eyes checked. Personal lifestyle choices, including: Daily care of your teeth and gums. Regular physical activity. Eating a healthy diet. Avoiding tobacco and drug use. Limiting alcohol use. Practicing safe sex. Taking low-dose aspirin every day. Taking vitamin and mineral supplements as recommended by your health care provider. What happens during an annual well check? The services and screenings done by your health care provider during your annual well check will depend on your age, overall health, lifestyle risk factors, and family history of disease. Counseling  Your health care provider may ask you questions about your: Alcohol use. Tobacco use. Drug use. Emotional well-being. Home and relationship well-being. Sexual activity. Eating habits. History of falls. Memory and ability to understand (cognition). Work and work Astronomer. Reproductive health. Screening  You may have the following tests or measurements: Height, weight, and BMI. Blood pressure. Lipid and cholesterol levels. These may be checked every 5 years, or more frequently if you are over 16 years old. Skin check. Lung cancer screening. You may have this screening every year starting at age 62 if you have a 30-pack-year history of smoking and currently smoke or have quit within the past 15 years. Fecal occult blood test (FOBT) of the stool. You may have this test every year starting at age 33. Flexible sigmoidoscopy or colonoscopy. You may have a  sigmoidoscopy every 5 years or a colonoscopy every 10 years starting at age 29. Hepatitis C blood test. Hepatitis B blood test. Sexually transmitted disease (STD) testing. Diabetes screening. This is done by checking your blood sugar (glucose) after you have not eaten for a while (fasting). You may have this done every 1-3 years. Bone density scan. This is done to screen for osteoporosis. You may have this done starting at age 10. Mammogram. This may be done every 1-2 years. Talk to your health care provider about how often you should have regular mammograms. Talk with your health care provider about your test results, treatment options, and if necessary, the need for more tests. Vaccines  Your health care provider may recommend certain vaccines, such as: Influenza vaccine. This is recommended every year. Tetanus, diphtheria, and acellular pertussis (Tdap, Td) vaccine. You may need a Td booster every 10 years. Zoster vaccine. You may need this after age 79. Pneumococcal 13-valent conjugate (PCV13) vaccine. One dose is recommended after age 32. Pneumococcal polysaccharide (PPSV23) vaccine. One dose is recommended after age 62. Talk to your health care provider about which screenings and vaccines you need and how often you need them. This information is not intended to replace advice given to you by your health care provider. Make sure you discuss any questions you have with your health care provider. Document Released: 03/23/2015 Document Revised: 11/14/2015 Document Reviewed: 12/26/2014 Elsevier Interactive Patient Education  2017 [employer].  Fall Prevention in the Home Falls can cause injuries. They can happen to people of all ages. There are many things you can do to make your home safe and to help prevent falls. What can I do on the outside of  my home? Regularly fix the edges of walkways and driveways and fix any cracks. Remove anything that might make you trip as you walk through a  door, such as a raised step or threshold. Trim any bushes or trees on the path to your home. Use bright outdoor lighting. Clear any walking paths of anything that might make someone trip, such as rocks or tools. Regularly check to see if handrails are loose or broken. Make sure that both sides of any steps have handrails. Any raised decks and porches should have guardrails on the edges. Have any leaves, snow, or ice cleared regularly. Use sand or salt on walking paths during winter. Clean up any spills in your garage right away. This includes oil or grease spills. What can I do in the bathroom? Use night lights. Install grab bars by the toilet and in the tub and shower. Do not use towel bars as grab bars. Use non-skid mats or decals in the tub or shower. If you need to sit down in the shower, use a plastic, non-slip stool. Keep the floor dry. Clean up any water that spills on the floor as soon as it happens. Remove soap buildup in the tub or shower regularly. Attach bath mats securely with double-sided non-slip rug tape. Do not have throw rugs and other things on the floor that can make you trip. What can I do in the bedroom? Use night lights. Make sure that you have a light by your bed that is easy to reach. Do not use any sheets or blankets that are too big for your bed. They should not hang down onto the floor. Have a firm chair that has side arms. You can use this for support while you get dressed. Do not have throw rugs and other things on the floor that can make you trip. What can I do in the kitchen? Clean up any spills right away. Avoid walking on wet floors. Keep items that you use a lot in easy-to-reach places. If you need to reach something above you, use a strong step stool that has a grab bar. Keep electrical cords out of the way. Do not use floor polish or wax that makes floors slippery. If you must use wax, use non-skid floor wax. Do not have throw rugs and other things  on the floor that can make you trip. What can I do with my stairs? Do not leave any items on the stairs. Make sure that there are handrails on both sides of the stairs and use them. Fix handrails that are broken or loose. Make sure that handrails are as long as the stairways. Check any carpeting to make sure that it is firmly attached to the stairs. Fix any carpet that is loose or worn. Avoid having throw rugs at the top or bottom of the stairs. If you do have throw rugs, attach them to the floor with carpet tape. Make sure that you have a light switch at the top of the stairs and the bottom of the stairs. If you do not have them, ask someone to add them for you. What else can I do to help prevent falls? Wear shoes that: Do not have high heels. Have rubber bottoms. Are comfortable and fit you well. Are closed at the toe. Do not wear sandals. If you use a stepladder: Make sure that it is fully opened. Do not climb a closed stepladder. Make sure that both sides of the stepladder are locked into place. Ask  someone to hold it for you, if possible. Clearly mark and make sure that you can see: Any grab bars or handrails. First and last steps. Where the edge of each step is. Use tools that help you move around (mobility aids) if they are needed. These include: Canes. Walkers. Scooters. Crutches. Turn on the lights when you go into a dark area. Replace any light bulbs as soon as they burn out. Set up your furniture so you have a clear path. Avoid moving your furniture around. If any of your floors are uneven, fix them. If there are any pets around you, be aware of where they are. Review your medicines with your doctor. Some medicines can make you feel dizzy. This can increase your chance of falling. Ask your doctor what other things that you can do to help prevent falls. This information is not intended to replace advice given to you by your health care provider. Make sure you discuss any  questions you have with your health care provider. Document Released: 12/21/2008 Document Revised: 08/02/2015 Document Reviewed: 03/31/2014 Elsevier Interactive Patient Education  2017 [employer].

## 2023-03-02 ENCOUNTER — Other Ambulatory Visit: Payer: Self-pay | Admitting: Internal Medicine

## 2023-03-17 DIAGNOSIS — Z5189 Encounter for other specified aftercare: Secondary | ICD-10-CM | POA: Diagnosis not present

## 2023-04-01 ENCOUNTER — Other Ambulatory Visit: Payer: Self-pay | Admitting: Internal Medicine

## 2023-04-16 ENCOUNTER — Ambulatory Visit: Payer: Medicare HMO | Admitting: Internal Medicine

## 2023-05-27 NOTE — Progress Notes (Incomplete)
 Patient Care Team: Margaree Mackintosh, MD as PCP - General (Internal Medicine)  Visit Date: 05/27/23  Subjective:  No chief complaint on file.  BP Readings from Last 1 Encounters:  02/10/23 139/72   Patient [name]:WNUU L Ashmead,Female DOB:[birth year],83 y.o. VOZ:[id number]   83 y.o. Female presents today for 6 months follow-up for ***. Patient has a past medical history of hypertension, hyperlipidemia, obesity, metabolic syndrome and diet-controlled controlled type 2 diabetes mellitus. Last seen in this office via telephone 02/2023 for her annual exam, in the interim has seen OrthoSurg 03/2023 for f/u s/p Total Hip Arthroplasty, Left.    hypothyroidism. Says it took time to adjust to new dose of thyroid medication. Says she felt bad early on on higher dose of synthroid and that BP ran low.  She was here in mid July and TSH was elevated at 4.97.  She denies missing doses of levothyroxine.  Levothyroxine was increased to 100 mcg daily.  TSH was repeated today and results are pending.   Getting flu vaccine at her local health department.   She has chronic kidney disease stage IIIa which is being observed and is stable.   History of osteoarthritis right shoulder.  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  She had left shoulder arthroplasty by Dr. Devonne Doughty in October 2018.  Right hip arthroplasty in 2018.  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   Has had one Shingrix vaccine at [facility] in [place]. Tetanus vaccine given there also. These were done in March and are in Epic.   Lipid panel and TSH checked today.   She has chronic kidney disease stage IIIa which is being observed and followed here.  History of hypertension, hyperlipidemia, obesity, metabolic syndrome, controlled type 2 diabetes mellitus.  Left shoulder arthroplasty by Dr. Devonne Doughty October 2018, right hip  arthroplasty 2018.  Diabetes is diet controlled at the present time. Past Medical History:  Diagnosis Date   Anxiety    Arthritis    ostearthritis. Degenerative disc disease- back, knees, hands   Back pain    Cataract 11/2016   hx - surgery to remove   Hyperlipidemia    Hypertension    Hypothyroidism    Pre-diabetes    Renal insufficiency    Renal insufficiency    Rotator cuff disorder    Pain with limited ROM right, spur left shoulder   Thyroid disease     Allergies  Allergen Reactions   Prinivil [Lisinopril] Cough    Family History  Problem Relation Age of Onset   Hypertension Mother    Stroke Mother    Cancer Father        Prostate cancer   Cancer Brother        Lung cancer   Hypertension Sister    Hypertension Child    Diabetes Child    Colon cancer Neg Hx    Esophageal cancer Neg Hx    Stomach cancer Neg Hx    Pancreatic cancer Neg Hx    Social History   Social History Narrative   Not on file   ROS   Objective:  Vitals: There were no vitals taken for this visit.  Physical Exam  Results:  Studies Obtained And Personally Reviewed By Me:  Imaging, colonoscopy, mammogram, bone density scan, echocardiogram, heart cath, stress test, CT calcium score, etc. ***  Labs:  Component Value Date/Time   NA 131 (L) 02/10/2023 0339   K 4.1 02/10/2023 0339   CL 100 02/10/2023 0339   CO2 26 02/10/2023 0339   GLUCOSE 162 (H) 02/10/2023 0339   BUN 16 02/10/2023 0339   CREATININE 1.40 (H) 02/10/2023 0339   CREATININE 1.37 (H) 04/18/2022 1002   CALCIUM 8.7 (L) 02/10/2023 0339   PROT 8.4 (H) 12/25/2022 1149   ALBUMIN 3.8 01/22/2017 1123   AST 19 12/25/2022 1149   ALT 10 12/25/2022 1149   ALKPHOS 71 01/22/2017 1123   BILITOT 0.7 12/25/2022 1149   GFRNONAA 38 (L) 02/10/2023 0339   GFRNONAA 38 (L) 08/20/2020 0000   GFRAA 44 (L) 08/20/2020 0000    Lab Results  Component Value Date   WBC 6.7 02/10/2023   HGB 9.8 (L) 02/10/2023   HCT 28.3 (L) 02/10/2023    MCV 94.3 02/10/2023   PLT 200 02/10/2023   Lab Results  Component Value Date   CHOL 161 12/25/2022   HDL 55 12/25/2022   LDLCALC 86 12/25/2022   TRIG 105 12/25/2022   CHOLHDL 2.9 12/25/2022   Lab Results  Component Value Date   HGBA1C 6.0 (H) 12/25/2022    Lab Results  Component Value Date   TSH 1.31 12/25/2022    {PSA (Optional):32132} No results found for any visits on 06/15/23. Assessment & Plan:   ***    I,Emily Lagle,acting as a scribe for Margaree Mackintosh, MD.,have documented all relevant documentation on the behalf of Margaree Mackintosh, MD,as directed by  Margaree Mackintosh, MD while in the presence of Margaree Mackintosh, MD.   ***

## 2023-06-15 ENCOUNTER — Ambulatory Visit: Payer: Medicare HMO | Admitting: Internal Medicine

## 2023-06-22 ENCOUNTER — Ambulatory Visit (INDEPENDENT_AMBULATORY_CARE_PROVIDER_SITE_OTHER): Admitting: Internal Medicine

## 2023-06-22 VITALS — BP 122/72 | HR 61 | Temp 97.2°F | Ht 62.5 in | Wt 198.0 lb

## 2023-06-22 DIAGNOSIS — M7918 Myalgia, other site: Secondary | ICD-10-CM

## 2023-06-22 DIAGNOSIS — N183 Chronic kidney disease, stage 3 unspecified: Secondary | ICD-10-CM | POA: Diagnosis not present

## 2023-06-22 DIAGNOSIS — Z96612 Presence of left artificial shoulder joint: Secondary | ICD-10-CM

## 2023-06-22 DIAGNOSIS — R7302 Impaired glucose tolerance (oral): Secondary | ICD-10-CM

## 2023-06-22 DIAGNOSIS — E782 Mixed hyperlipidemia: Secondary | ICD-10-CM

## 2023-06-22 DIAGNOSIS — Z96652 Presence of left artificial knee joint: Secondary | ICD-10-CM | POA: Diagnosis not present

## 2023-06-22 DIAGNOSIS — I10 Essential (primary) hypertension: Secondary | ICD-10-CM

## 2023-06-22 DIAGNOSIS — N1831 Chronic kidney disease, stage 3a: Secondary | ICD-10-CM | POA: Diagnosis not present

## 2023-06-22 DIAGNOSIS — E039 Hypothyroidism, unspecified: Secondary | ICD-10-CM | POA: Diagnosis not present

## 2023-06-22 DIAGNOSIS — Z96641 Presence of right artificial hip joint: Secondary | ICD-10-CM | POA: Diagnosis not present

## 2023-06-22 DIAGNOSIS — G8929 Other chronic pain: Secondary | ICD-10-CM

## 2023-06-22 NOTE — Progress Notes (Signed)
 Patient Care Team: Sylvan Evener, MD as PCP - General (Internal Medicine)  Visit Date: 06/22/23  Subjective:   Chief Complaint  Patient presents with   Follow-up   Vitals:   06/22/23 1120  BP: 122/72  Patient ZO:[name],Female DOB:1940-05-02,83 y.o. EAV:[id number]   83 y.o. Female presents today for 6 months follow-up for Hypertension; Hyperlipidemia; Diabetes Mellitus, type II; Hypothyroidism; Chronic Kidney Disease. Patient has a past medical history of Dependent Edema; Hypokalemia; Osteoarthritis, Left Hip; S/p TKA Left & Right, Right Hip Replacement, Left Shoulder Replacement. Seen for her annual visit on 02/18/2023, in the interim she has seen Sharlynn Dear with Orthopedic Surgery for f/u from her Total Hip Arthroplasty, Left.  History of Hypertension treated with Amlodipine  5 mg daily and Metoprolol  succinate 25 mg daily. Blood Pressure: normotensive today at 122/72. Dependent Edema treated with Torsemide  20 mg daily. Hypokalemia treated with Klor-Con  10 meq daily.   History of Hyperlipidemia treated with  Simvastatin  20 mg at bedtime. Lipid Panel ordered today, collected.   History of Diabetes Mellitus, type II with HgbA1c ordered today, collected.   History of Hypothyroidism treated with Levothyroxine  75 mcg. TSH ordered today, collected.   History of Chronic Kidney Disease, stage 3b likely contributed by Hypertension, Diabetes, and chronic NSAID use for osteoarthritis.  S/p Total Hip Arthroplasty, Left on 02/09/2023 by Dr. Athena Lazier; S/p Left TKA 2017 & Right TKA 01/2017; S/p Left Shoulder Arthroplasty by Dr. Brunilda Capra 12/2016; S/p Right Hip Arthroplasty 2018. History of Osteoarthritis; Musculoskeletal Pain. She says that since her hip surgery she has not been doing any yard work or gardening.   Colonoscopy 12/18/2016 via Dr. Bridgett Camps removed 2 Polyps - one 4 mm sessile polyp from cecum and one 3 mm sessile polyp from the transverse colon; Scattered Small-mouthed  Diverticula in the sigmoid and descending colon; Medium-sized Internal Hemorrhoids. Overdue for repeat - discussed, she declines colonoscopy and at this time hemoccult colo-guard as well.  Past Medical History:  Diagnosis Date   Anxiety    Arthritis    ostearthritis. Degenerative disc disease- back, knees, hands   Back pain    Cataract 11/2016   hx - surgery to remove   Hyperlipidemia    Hypertension    Hypothyroidism    Pre-diabetes    Renal insufficiency    Renal insufficiency    Rotator cuff disorder    Pain with limited ROM right, spur left shoulder   Thyroid  disease     Allergies  Allergen Reactions   Prinivil [Lisinopril] Cough    Family History  Problem Relation Age of Onset   Hypertension Mother    Stroke Mother    Cancer Father        Prostate cancer   Cancer Brother        Lung cancer   Hypertension Sister    Hypertension Child    Diabetes Child    Colon cancer Neg Hx    Esophageal cancer Neg Hx    Stomach cancer Neg Hx    Pancreatic cancer Neg Hx    Social History   Social History Narrative   Not on file   Review of Systems  Cardiovascular:  Positive for leg swelling (did not take Demadex  this morning).     Objective:  Vitals: BP 122/72   Pulse 61   Temp (!) 97.2 F (36.2 C) (Temporal)   Ht 5' 2.5" (1.588 m)   Wt 198 lb (89.8 kg)   SpO2 94%   BMI 35.64  kg/m   Physical Exam Vitals and nursing note reviewed.  Constitutional:      General: She is not in acute distress.    Appearance: Normal appearance. She is not toxic-appearing.  HENT:     Head: Normocephalic and atraumatic.  Cardiovascular:     Pulses:          Dorsalis pedis pulses are 2+ on the right side and 2+ on the left side.       Posterior tibial pulses are 2+ on the right side and 2+ on the left side.  Pulmonary:     Effort: Pulmonary effort is normal.  Musculoskeletal:     Right lower leg: Edema present.     Left lower leg: Edema present.     Right foot: No deformity.      Left foot: No deformity.  Feet:     Right foot:     Skin integrity: Skin integrity normal. No ulcer, blister, skin breakdown, erythema, warmth, callus, dry skin or fissure.     Left foot:     Skin integrity: Skin integrity normal. No ulcer, blister, skin breakdown, erythema, warmth, callus, dry skin or fissure.  Skin:    General: Skin is warm and dry.  Neurological:     Mental Status: She is alert and oriented to person, place, and time. Mental status is at baseline.  Psychiatric:        Mood and Affect: Mood normal.        Behavior: Behavior normal.        Thought Content: Thought content normal.        Judgment: Judgment normal.     Results:  Studies Obtained And Personally Reviewed By Me:  Diabetic Foot Exam - Simple   Simple Foot Form Diabetic Foot exam was performed with the following findings: Yes 06/22/2023 11:45 AM  Visual Inspection No deformities, no ulcerations, no other skin breakdown bilaterally: Yes Sensation Testing Intact to touch and monofilament testing bilaterally: Yes Pulse Check Posterior Tibialis and Dorsalis pulse intact bilaterally: Yes Comments    Labs:     Component Value Date/Time   NA 131 (L) 02/10/2023 0339   K 4.1 02/10/2023 0339   CL 100 02/10/2023 0339   CO2 26 02/10/2023 0339   GLUCOSE 162 (H) 02/10/2023 0339   BUN 16 02/10/2023 0339   CREATININE 1.40 (H) 02/10/2023 0339   CREATININE 1.37 (H) 04/18/2022 1002   CALCIUM 8.7 (L) 02/10/2023 0339   PROT 8.4 (H) 12/25/2022 1149   ALBUMIN  3.8 01/22/2017 1123   AST 19 12/25/2022 1149   ALT 10 12/25/2022 1149   ALKPHOS 71 01/22/2017 1123   BILITOT 0.7 12/25/2022 1149   GFRNONAA 38 (L) 02/10/2023 0339   GFRNONAA 38 (L) 08/20/2020 0000   GFRAA 44 (L) 08/20/2020 0000    Lab Results  Component Value Date   WBC 6.7 02/10/2023   HGB 9.8 (L) 02/10/2023   HCT 28.3 (L) 02/10/2023   MCV 94.3 02/10/2023   PLT 200 02/10/2023   Lab Results  Component Value Date   CHOL 161 12/25/2022   HDL 55  12/25/2022   LDLCALC 86 12/25/2022   TRIG 105 12/25/2022   CHOLHDL 2.9 12/25/2022   Lab Results  Component Value Date   HGBA1C 6.0 (H) 12/25/2022    Lab Results  Component Value Date   TSH 1.31 12/25/2022   Assessment & Plan:   Hypertension treated with Amlodipine  5 mg daily and Metoprolol  succinate 25 mg daily. Blood Pressure: normotensive  today at 122/72.   Dependent Edema treated with Torsemide  20 mg daily.   Hypokalemia treated with Klor-Con  10 meq daily.   Hyperlipidemia treated with  Simvastatin  20 mg at bedtime. Lipid Panel ordered today, collected.   Diabetes Mellitus, type II with HgbA1c ordered today, collected.   Hypothyroidism treated with Levothyroxine  75 mcg. TSH ordered today, collected.   Chronic Kidney Disease, stage 3b likely contributed by Hypertension, Diabetes, and chronic NSAID use for osteoarthritis.  Osteoarthritis; Musculoskeletal Pain; S/p Total Hip Arthroplasty, Left on 02/09/2023 by Dr. Athena Lazier. She says that since her hip surgery she has not been doing any yard work or gardening. Sending in Norco 5/325-- 5 day supply  (#15 tabs) to take sparingly as needed for pain. She takes this medication infrequently and sparingly.   Colonoscopy 12/18/2016 via Dr. Bridgett Camps removed 2 Polyps - one 4 mm sessile polyp from cecum and one 3 mm sessile polyp from the transverse colon; Scattered Small-mouthed Diverticula in the sigmoid and descending colon; Medium-sized Internal Hemorrhoids. Overdue for repeat - discussed, she declines colonoscopy and at this time hemoccult colo-guard as well.     I,Emily Lagle,acting as a Neurosurgeon for Sylvan Evener, MD.,have documented all relevant documentation on the behalf of Sylvan Evener, MD,as directed by  Sylvan Evener, MD while in the presence of Sylvan Evener, MD.   I, Sylvan Evener, MD, have reviewed all documentation for this visit. The documentation on 07/02/23 for the exam, diagnosis, procedures, and orders are all  accurate and complete.

## 2023-06-22 NOTE — Patient Instructions (Addendum)
 It was a pleasure to see you today. Labs drawn and results pending. Sending in Hydrocodone  to take sparingly for pain if needed. See you for wellness visit in 6 months

## 2023-06-23 LAB — LIPID PANEL
Cholesterol: 240 mg/dL — ABNORMAL HIGH (ref <200)
HDL: 68 mg/dL (ref >=50)
LDL Cholesterol (Calc): 150 mg/dL — ABNORMAL HIGH
Non-HDL Cholesterol (Calc): 172 mg/dL — ABNORMAL HIGH (ref <130)
Total CHOL/HDL Ratio: 3.5 (calc) (ref <5.0)
Triglycerides: 102 mg/dL (ref <150)

## 2023-06-23 LAB — HEPATIC FUNCTION PANEL
AG Ratio: 1.1 (calc) (ref 1.0–2.5)
ALT: 5 U/L — ABNORMAL LOW (ref 6–29)
AST: 17 U/L (ref 10–35)
Albumin: 4.1 g/dL (ref 3.6–5.1)
Alkaline phosphatase (APISO): 67 U/L (ref 37–153)
Bilirubin, Direct: 0.2 mg/dL (ref 0.0–0.2)
Globulin: 3.9 g/dL — ABNORMAL HIGH (ref 1.9–3.7)
Indirect Bilirubin: 0.4 mg/dL (ref 0.2–1.2)
Total Bilirubin: 0.6 mg/dL (ref 0.2–1.2)
Total Protein: 8 g/dL (ref 6.1–8.1)

## 2023-06-23 LAB — HEMOGLOBIN A1C
Hgb A1c MFr Bld: 5.7 % — ABNORMAL HIGH (ref <5.7)
Mean Plasma Glucose: 117 mg/dL
eAG (mmol/L): 6.5 mmol/L

## 2023-06-23 LAB — TSH: TSH: 2.85 m[IU]/L (ref 0.40–4.50)

## 2023-07-02 ENCOUNTER — Encounter: Payer: Self-pay | Admitting: Internal Medicine

## 2023-07-02 MED ORDER — HYDROCODONE-ACETAMINOPHEN 5-325 MG PO TABS: ORAL_TABLET | ORAL | 0 refills | Status: DC

## 2023-07-03 ENCOUNTER — Other Ambulatory Visit: Payer: Self-pay | Admitting: Internal Medicine

## 2023-07-03 ENCOUNTER — Other Ambulatory Visit: Payer: Self-pay

## 2023-07-03 DIAGNOSIS — E782 Mixed hyperlipidemia: Secondary | ICD-10-CM

## 2023-07-03 MED ORDER — SIMVASTATIN 20 MG PO TABS: 20.0000 mg | ORAL_TABLET | Freq: Every day | ORAL | 1 refills | Status: DC

## 2023-07-16 DIAGNOSIS — H3530 Unspecified macular degeneration: Secondary | ICD-10-CM | POA: Diagnosis not present

## 2023-08-20 DIAGNOSIS — H353113 Nonexudative age-related macular degeneration, right eye, advanced atrophic without subfoveal involvement: Secondary | ICD-10-CM | POA: Diagnosis not present

## 2023-09-14 DIAGNOSIS — H02831 Dermatochalasis of right upper eyelid: Secondary | ICD-10-CM | POA: Diagnosis not present

## 2023-09-28 DIAGNOSIS — Z961 Presence of intraocular lens: Secondary | ICD-10-CM | POA: Diagnosis not present

## 2023-09-28 DIAGNOSIS — H353110 Nonexudative age-related macular degeneration, right eye, stage unspecified: Secondary | ICD-10-CM | POA: Diagnosis not present

## 2023-09-28 DIAGNOSIS — H26491 Other secondary cataract, right eye: Secondary | ICD-10-CM | POA: Diagnosis not present

## 2023-09-28 DIAGNOSIS — H26493 Other secondary cataract, bilateral: Secondary | ICD-10-CM | POA: Diagnosis not present

## 2023-09-28 DIAGNOSIS — H02421 Myogenic ptosis of right eyelid: Secondary | ICD-10-CM | POA: Diagnosis not present

## 2023-09-28 DIAGNOSIS — H26492 Other secondary cataract, left eye: Secondary | ICD-10-CM | POA: Diagnosis not present

## 2023-09-28 DIAGNOSIS — H35033 Hypertensive retinopathy, bilateral: Secondary | ICD-10-CM | POA: Diagnosis not present

## 2023-12-08 ENCOUNTER — Other Ambulatory Visit: Payer: Self-pay | Admitting: Internal Medicine

## 2023-12-13 DIAGNOSIS — Z23 Encounter for immunization: Secondary | ICD-10-CM | POA: Diagnosis not present

## 2023-12-25 ENCOUNTER — Other Ambulatory Visit: Payer: Self-pay | Admitting: Internal Medicine

## 2023-12-25 DIAGNOSIS — E782 Mixed hyperlipidemia: Secondary | ICD-10-CM

## 2024-02-15 NOTE — Progress Notes (Signed)
 "  Annual Wellness Visit   Patient Care Team: Talesha Ellithorpe, Sydney PARAS, MD as PCP - General (Internal Medicine)  Visit Date: 02/29/2024   Chief Complaint  Patient presents with   Annual Exam   Medicare Wellness   Subjective:  Patient: [name], Female DOB: [birth year], 83 y.o. MRN: [id number] Vitals:   02/29/24 1050  BP: 130/80   [name] is a 83 y.o. Female who presents today for her Annual Wellness Visit. Patient has Hypertension; Hypothyroidism; Hyperlipidemia; OA (osteoarthritis) of hip; Low back pain; Diabetes mellitus, type 2 (HCC); Dependent edema; Chronic kidney disease; S/P shoulder replacement, left; Status post left knee replacement; Status post right knee replacement; History of right hip replacement; and Primary osteoarthritis of left hip on their problem list.  History of Hypertension treated with Amlodipine  5 mg daily and Metoprolol  succinate 25 mg daily. Blood Pressure: normotensive today at 130/80. Dependent Edema treated with Torsemide  20 mg daily. Hypokalemia treated with Klor-Con  10 meq daily.    She has been experiencing sinus drainage for at least 2 weeks.       History of musculoskeletal pain. left knee arthroplasty 2017. Right knee arthroplasty November 2018. Left shoulder arthroplasty by Dr. Kay October 2018. Right hip arthroplasty 2018. She says that she has been experiencing hip pain.   History of Hyperlipidemia treated with  Simvastatin  20 mg at bedtime. Lipid Panel ordered today, collected.    History of Diabetes Mellitus, type II with HgbA1c ordered today, collected.    History of Hypothyroidism treated with Levothyroxine  75 mcg. TSH ordered today, collected.    History of Chronic Kidney Disease, stage 3b likely contributed by Hypertension, Diabetes, and chronic NSAID use for osteoarthritis.  Labs Pending.    08/20/2020 Mammogram no mammographic evidence of malignancy. Repeat in one year.    12/18/2016 Colonoscopy One 4 mm polyp in the cecum.  Resected and retrieved. One 3 mm polyp in the transverse colon. Resected and retrieved. Pathology found both polyps to be benign but one was precancerous. Mild diverticulosis in the sigmoid colon and in the descending colon. Internal hemorrhoids. Repeat in five years.  Vaccine counseling: UTD on Influenza vaccine.     Health maintenance: Mammogram declined.   Health Maintenance  Topic Date Due   Diabetic kidney evaluation - Urine ACR  04/19/2023   HEMOGLOBIN A1C  12/22/2023   Diabetic kidney evaluation - eGFR measurement  02/10/2024   Medicare Annual Wellness (AWV)  02/18/2024   Colonoscopy  06/21/2024 (Originally 12/18/2021)   FOOT EXAM  06/21/2024   OPHTHALMOLOGY EXAM  08/19/2024   DTaP/Tdap/Td (3 - Td or Tdap) 05/25/2031   Pneumococcal Vaccine: 50+ Years  Completed   Influenza Vaccine  Completed   Zoster Vaccines- Shingrix  Completed   Meningococcal B Vaccine  Aged Out   Bone Density Scan  Discontinued   COVID-19 Vaccine  Discontinued    Review of Systems  Constitutional:  Negative for fever and malaise/fatigue.  HENT:  Negative for congestion.   Eyes:  Negative for blurred vision.  Respiratory:  Negative for cough and shortness of breath.   Cardiovascular:  Negative for chest pain, palpitations and leg swelling.  Gastrointestinal:  Negative for vomiting.  Musculoskeletal:  Negative for back pain.  Skin:  Negative for rash.  Neurological:  Negative for loss of consciousness and headaches.   Objective:  Vitals: body mass index is 40.32 kg/m. Today's Vitals   02/29/24 1050  BP: 130/80  Pulse: 72  SpO2: 98%  Weight: 224 lb (101.6  kg)  Height: 5' 2.5 (1.588 m)   Physical Exam Vitals and nursing note reviewed.  Constitutional:      General: She is not in acute distress.    Appearance: Normal appearance. She is not ill-appearing or toxic-appearing.  HENT:     Head: Normocephalic and atraumatic.     Right Ear: Hearing, tympanic membrane, ear canal and external ear  normal.     Left Ear: Hearing, tympanic membrane, ear canal and external ear normal.     Mouth/Throat:     Pharynx: Oropharynx is clear.  Eyes:     Extraocular Movements: Extraocular movements intact.     Pupils: Pupils are equal, round, and reactive to light.  Neck:     Thyroid : No thyroid  mass, thyromegaly or thyroid  tenderness.     Vascular: No carotid bruit.     Comments: Anterior cervical notes palpable but not enlarged. Cardiovascular:     Rate and Rhythm: Normal rate and regular rhythm. No extrasystoles are present.    Pulses:          Dorsalis pedis pulses are 2+ on the right side and 2+ on the left side.     Heart sounds: Normal heart sounds. No murmur heard.    No friction rub. No gallop.  Pulmonary:     Effort: Pulmonary effort is normal.     Breath sounds: Normal breath sounds. No decreased breath sounds, wheezing, rhonchi or rales.  Chest:     Chest wall: No mass.  Abdominal:     Palpations: Abdomen is soft. There is no hepatomegaly, splenomegaly or mass.     Tenderness: There is no abdominal tenderness.     Hernia: No hernia is present.  Musculoskeletal:     Cervical back: Normal range of motion.     Right lower leg: Edema present.     Left lower leg: Edema present.     Comments: Trace lower extremity edema.   Lymphadenopathy:     Cervical: No cervical adenopathy.     Upper Body:     Right upper body: No supraclavicular adenopathy.     Left upper body: No supraclavicular adenopathy.  Skin:    General: Skin is warm and dry.  Neurological:     General: No focal deficit present.     Mental Status: She is alert and oriented to person, place, and time. Mental status is at baseline.     Sensory: Sensation is intact.     Motor: Motor function is intact. No weakness.     Deep Tendon Reflexes: Reflexes are normal and symmetric.  Psychiatric:        Attention and Perception: Attention normal.        Mood and Affect: Mood normal.        Speech: Speech normal.         Behavior: Behavior normal.        Thought Content: Thought content normal.        Cognition and Memory: Cognition normal.        Judgment: Judgment normal.     Current Outpatient Medications  Medication Instructions   amLODipine  (NORVASC ) 5 mg, Oral, Daily   azithromycin  (ZITHROMAX ) 250 MG tablet Take 2 tablets on day 1, then 1 tablet daily on days 2 through 5   cyclobenzaprine  (FLEXERIL ) 10 mg, Oral, 2 times daily PRN   HYDROcodone -acetaminophen  (NORCO/VICODIN) 5-325 MG tablet One tab by mouth every 8 hours as needed for musculoskeletal pain   levothyroxine  (SYNTHROID ) 75 mcg,  Oral, Daily before breakfast   metoprolol  succinate (TOPROL -XL) 25 mg, Oral, Daily   ondansetron  (ZOFRAN ) 4 mg, Oral, Every 6 hours PRN   potassium chloride  (KLOR-CON ) 10 MEQ tablet 10 mEq, Oral, Daily   simvastatin  (ZOCOR ) 20 mg, Oral, Daily at bedtime   simvastatin  (ZOCOR ) 20 mg, Oral, Daily at bedtime   torsemide  (DEMADEX ) 20 mg, Oral, Daily   Past Medical History:  Diagnosis Date   Anxiety    Arthritis    ostearthritis. Degenerative disc disease- back, knees, hands   Back pain    Cataract 11/2016   hx - surgery to remove   Hyperlipidemia    Hypertension    Hypothyroidism    Pre-diabetes    Renal insufficiency    Renal insufficiency    Rotator cuff disorder    Pain with limited ROM right, spur left shoulder   Thyroid  disease    Medical/Surgical History Narrative:  Allergic/Intolerant to:  Allergies  Allergen Reactions   Prinivil [Lisinopril] Cough    Past Surgical History:  Procedure Laterality Date   CATARACT EXTRACTION, BILATERAL Bilateral 11/2016   INJECTION KNEE Right 07/16/2015   Procedure: RIGHT KNEE INJECTION;  Surgeon: Dempsey Moan, MD;  Location: [facility];  Service: Orthopedics;  Laterality: Right;   JOINT REPLACEMENT Right 10/2008   RTHA-hip   REVERSE SHOULDER ARTHROPLASTY Left 08/15/2016   Procedure: REVERSE LEFT SHOULDER ARTHROPLASTY;  Surgeon: Kay Kemps, MD;  Location: [facility];   Service: Orthopedics;  Laterality: Left;   TOTAL HIP ARTHROPLASTY Left 02/09/2023   Procedure: TOTAL HIP ARTHROPLASTY ANTERIOR APPROACH;  Surgeon: Moan Dempsey, MD;  Location: [facility];  Service: Orthopedics;  Laterality: Left;   TOTAL KNEE ARTHROPLASTY Left 07/16/2015   Procedure: LEFT TOTAL KNEE ARTHROPLASTY;  Surgeon: Dempsey Moan, MD;  Location: [facility];  Service: Orthopedics;  Laterality: Left;   TOTAL KNEE ARTHROPLASTY Right 01/26/2017   Procedure: RIGHT TOTAL KNEE ARTHROPLASTY;  Surgeon: Moan Dempsey, MD;  Location: [facility];  Service: Orthopedics;  Laterality: Right;   Family History  Problem Relation Age of Onset   Hypertension Mother    Stroke Mother    Cancer Father        Prostate cancer   Cancer Brother        Lung cancer   Hypertension Sister    Hypertension Child    Diabetes Child    Colon cancer Neg Hx    Esophageal cancer Neg Hx    Stomach cancer Neg Hx    Pancreatic cancer Neg Hx    Family History Narrative:  Family history: Father died at age 76 of prostate cancer.  Mother died at age 33 of a stroke with history of hypertension.  1 brother with history of lung cancer.  Social history: She resides alone in [place], [place]. Daughter who is a engineer, civil (consulting) at [employer] resides here.  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  She does her own housework and her yard work.  She owns her own home.  Patient has grandchildren and great-grandchildren.  Non-smoker.  Does not consume alcohol.   Most Recent Health Risks Assessment:   Most Recent Social Determinants of Health (Including Hx of Tobacco, Alcohol, and Drug Use) SDOH Screenings   Food Insecurity: No Food Insecurity (02/29/2024)  Housing: Low Risk (02/29/2024)  Transportation Needs: No Transportation Needs (02/09/2023)  Utilities: Not At Risk (02/09/2023)  Alcohol Screen: Low Risk (02/29/2024)  Depression (PHQ2-9): Low Risk (06/22/2023)  Financial Resource  Strain:  Low Risk (02/29/2024)  Physical Activity: Insufficiently Active (02/29/2024)  Social Connections: Moderately Integrated (02/29/2024)  Stress: No Stress Concern Present (02/29/2024)  Tobacco Use: Low Risk (02/29/2024)  Health Literacy: Adequate Health Literacy (02/18/2023)   Social History   Tobacco Use   Smoking status: Never   Smokeless tobacco: Never  Vaping Use   Vaping status: Never Used  Substance Use Topics   Alcohol use: No   Drug use: No     Most Recent Fall Risk Assessment:    02/29/2024   10:44 AM  Fall Risk   Falls in the past year? 0  Number falls in past yr: 0  Injury with Fall? 0  Risk for fall due to : No Fall Risks  Follow up Falls prevention discussed;Education provided;Falls evaluation completed   Most Recent Anxiety/Depression Screenings:    06/22/2023   11:20 AM 04/18/2022    9:34 AM  PHQ 2/9 Scores  PHQ - 2 Score 0 0      06/22/2023   11:20 AM  GAD 7 : Generalized Anxiety Score  Nervous, Anxious, on Edge 0  Control/stop worrying 0  Worry too much - different things 0  Trouble relaxing 0  Restless 0  Easily annoyed or irritable 0  Afraid - awful might happen 0  Total GAD 7 Score 0  Anxiety Difficulty Not difficult at all   Most Recent Cognitive Screening:    02/29/2024   10:44 AM  6CIT Screen  What Year? 0 points  What month? 0 points  What time? 0 points  Count back from 20 4 points  Months in reverse 4 points  Repeat phrase 0 points  Total Score 8 points    Results:  Studies Obtained And Personally Reviewed By Me:   08/20/2020 Mammogram no mammographic evidence of malignancy. Repeat in one year.    12/18/2016 Colonoscopy One 4 mm polyp in the cecum. Resected and retrieved. One 3 mm polyp in the transverse colon. Resected and retrieved. Pathology found both polyps to be benign but one was precancerous. Mild diverticulosis in the sigmoid colon and in the descending colon. Internal hemorrhoids. Repeat in five years.   Labs:   CBC w/ Differential Lab Results  Component Value Date   WBC 6.7 02/10/2023   RBC 3.00 (L) 02/10/2023   HGB 9.8 (L) 02/10/2023   HCT 28.3 (L) 02/10/2023   PLT 200 02/10/2023   MCV 94.3 02/10/2023   MCH 32.7 02/10/2023   MCHC 34.6 02/10/2023   RDW 13.9 02/10/2023   MPV 10.4 04/18/2022   LYMPHSABS 1,527 04/18/2022   MONOABS 456 12/24/2015   BASOSABS 20 04/18/2022    Comprehensive Metabolic Panel Lab Results  Component Value Date   NA 131 (L) 02/10/2023   K 4.1 02/10/2023   CL 100 02/10/2023   CO2 26 02/10/2023   GLUCOSE 162 (H) 02/10/2023   BUN 16 02/10/2023   CREATININE 1.40 (H) 02/10/2023   CALCIUM 8.7 (L) 02/10/2023   PROT 8.0 06/22/2023   ALBUMIN  3.8 01/22/2017   AST 17 06/22/2023   ALT 5 (L) 06/22/2023   ALKPHOS 71 01/22/2017   BILITOT 0.6 06/22/2023   EGFR 39 (L) 04/18/2022   GFRNONAA 38 (L) 02/10/2023   Lipid Panel  Lab Results  Component Value Date   CHOL 240 (H) 06/22/2023   HDL 68 06/22/2023   LDLCALC 150 (H) 06/22/2023   TRIG 102 06/22/2023   A1c Lab Results  Component Value Date   HGBA1C 5.7 (H) 06/22/2023  TSH Lab Results  Component Value Date   TSH 2.85 06/22/2023   Assessment & Plan:   Orders Placed This Encounter  Procedures   CBC with Differential/Platelet   Comprehensive metabolic panel with GFR   Hemoglobin A1c   Lipid panel   Microalbumin / creatinine urine ratio   TSH   Meds ordered this encounter  Medications   azithromycin  (ZITHROMAX ) 250 MG tablet    Sig: Take 2 tablets on day 1, then 1 tablet daily on days 2 through 5    Dispense:  6 tablet    Refill:  0   cyclobenzaprine  (FLEXERIL ) 10 MG tablet    Sig: Take 1 tablet (10 mg total) by mouth 2 (two) times daily as needed for muscle spasms.    Dispense:  60 tablet    Refill:  0   Hypertension: treated with Amlodipine  5 mg daily and Metoprolol  succinate 25 mg daily. Blood Pressure: normotensive today at 130/80. Dependent Edema treated with Torsemide  20 mg daily.  Hypokalemia treated with Klor-Con  10 meq daily.    She has been experiencing sinus drainage for at least 2 weeks.     Azithromycin  250 mg  2 tablets on day one, 1 tablet day on days 2-5 prescribed.   Musculoskeletal pain: left knee arthroplasty 2017. Right knee arthroplasty November 2018. Left shoulder arthroplasty by Dr. Kay October 2018. Right hip arthroplasty 2018. She says that she has been experiencing hip pain.    Flexeril  250 mg twice daily as needed prescribed .    Norco/[place] 5-325 mg every 8 hours as needed prescribed.   Hyperlipidemia: treated with  Simvastatin  20 mg at bedtime. Lipid Panel ordered today, collected.    Diabetes Mellitus, type II: with HgbA1c ordered today, collected.    Hypothyroidism: treated with Levothyroxine  75 mcg. TSH ordered today, collected.    Chronic Kidney Disease, stage 3b: likely contributed by Hypertension, Diabetes, and chronic NSAID use for osteoarthritis.    08/20/2020 Mammogram no mammographic evidence of malignancy. Repeat in one year.    12/18/2016 Colonoscopy One 4 mm polyp in the cecum. Resected and retrieved. One 3 mm polyp in the transverse colon. Resected and retrieved. Pathology found both polyps to be benign but one was precancerous. Mild diverticulosis in the sigmoid colon and in the descending colon. Internal hemorrhoids. Repeat in five years.  Vaccine counseling: UTD on Influenza vaccine.     Health maintenance: Mammogram declined.     Annual Wellness Visit done today including the all of the following: Reviewed patient's Family Medical History Reviewed patient's SDOH and reviewed tobacco, alcohol, and drug use.  Reviewed and updated list of patient's medical providers Assessment of cognitive impairment was done Assessed patient's functional ability Established a written schedule for health screening services Health Risk Assessent Completed and Reviewed  Discussed health benefits of physical activity, and encouraged  her to engage in regular exercise appropriate for her age and condition.    I,Makayla C Reid,acting as a scribe for Sydney JINNY Hailstone, MD.,have documented all relevant documentation on the behalf of Sydney JINNY Hailstone, MD,as directed by  Sydney JINNY Hailstone, MD while in the presence of Sydney JINNY Hailstone, MD.  I, Sydney JINNY Hailstone, MD, have reviewed all documentation for and agree with the above Annual Wellness Visit documentation.  Sydney JINNY Hailstone, MD Internal Medicine 02/29/2024 "

## 2024-02-18 ENCOUNTER — Ambulatory Visit: Admitting: Internal Medicine

## 2024-02-29 ENCOUNTER — Ambulatory Visit: Payer: Self-pay | Admitting: Internal Medicine

## 2024-02-29 ENCOUNTER — Encounter: Payer: Self-pay | Admitting: Internal Medicine

## 2024-02-29 VITALS — BP 130/80 | HR 72 | Ht 62.5 in | Wt 224.0 lb

## 2024-02-29 DIAGNOSIS — I10 Essential (primary) hypertension: Secondary | ICD-10-CM | POA: Diagnosis not present

## 2024-02-29 DIAGNOSIS — E785 Hyperlipidemia, unspecified: Secondary | ICD-10-CM

## 2024-02-29 DIAGNOSIS — R609 Edema, unspecified: Secondary | ICD-10-CM

## 2024-02-29 DIAGNOSIS — M7918 Myalgia, other site: Secondary | ICD-10-CM

## 2024-02-29 DIAGNOSIS — N1832 Chronic kidney disease, stage 3b: Secondary | ICD-10-CM

## 2024-02-29 DIAGNOSIS — Z96652 Presence of left artificial knee joint: Secondary | ICD-10-CM

## 2024-02-29 DIAGNOSIS — N183 Chronic kidney disease, stage 3 unspecified: Secondary | ICD-10-CM

## 2024-02-29 DIAGNOSIS — Z0000 Encounter for general adult medical examination without abnormal findings: Secondary | ICD-10-CM

## 2024-02-29 DIAGNOSIS — E782 Mixed hyperlipidemia: Secondary | ICD-10-CM

## 2024-02-29 DIAGNOSIS — M1612 Unilateral primary osteoarthritis, left hip: Secondary | ICD-10-CM

## 2024-02-29 DIAGNOSIS — Z96641 Presence of right artificial hip joint: Secondary | ICD-10-CM

## 2024-02-29 DIAGNOSIS — E039 Hypothyroidism, unspecified: Secondary | ICD-10-CM | POA: Diagnosis not present

## 2024-02-29 DIAGNOSIS — E1122 Type 2 diabetes mellitus with diabetic chronic kidney disease: Secondary | ICD-10-CM | POA: Diagnosis not present

## 2024-02-29 DIAGNOSIS — Z96651 Presence of right artificial knee joint: Secondary | ICD-10-CM

## 2024-02-29 DIAGNOSIS — R7302 Impaired glucose tolerance (oral): Secondary | ICD-10-CM

## 2024-02-29 DIAGNOSIS — Z96612 Presence of left artificial shoulder joint: Secondary | ICD-10-CM

## 2024-02-29 DIAGNOSIS — N1831 Chronic kidney disease, stage 3a: Secondary | ICD-10-CM

## 2024-02-29 MED ORDER — AZITHROMYCIN 250 MG PO TABS: ORAL_TABLET | ORAL | 0 refills | Status: AC

## 2024-02-29 MED ORDER — HYDROCODONE-ACETAMINOPHEN 5-325 MG PO TABS: ORAL_TABLET | ORAL | 0 refills | Status: AC

## 2024-02-29 MED ORDER — CYCLOBENZAPRINE HCL 10 MG PO TABS: 10.0000 mg | ORAL_TABLET | Freq: Two times a day (BID) | ORAL | 0 refills | Status: AC | PRN

## 2024-02-29 NOTE — Patient Instructions (Addendum)
 [name],  Have refilled Hydrocodone  APAP and Flexeril . Labs drawn and results are pending.  Addendum: needs repeat CBC due to elevatedHCT/HGB which may be an error  Thank you for taking the time for your Medicare Wellness Visit. I appreciate your continued commitment to your health goals. Please review the care plan we discussed, and feel free to reach out if I can assist you further.  Please note that Annual Wellness Visits do not include a physical exam. Some assessments may be limited, especially if the visit was conducted virtually. If needed, we may recommend an in-person follow-up with your provider.  Ongoing Care Seeing your primary care provider every 3 to 6 months helps us  monitor your health and provide consistent, personalized care.   Referrals If a referral was made during today's visit and you haven't received any updates within two weeks, please contact the referred provider directly to check on the status.  Recommended Screenings:  Health Maintenance  Topic Date Due   Yearly kidney health urinalysis for diabetes  04/19/2023   Hemoglobin A1C  12/22/2023   Yearly kidney function blood test for diabetes  02/10/2024   Medicare Annual Wellness Visit  02/18/2024   Colon Cancer Screening  06/21/2024*   Complete foot exam   06/21/2024   Eye exam for diabetics  08/19/2024   DTaP/Tdap/Td vaccine (3 - Td or Tdap) 05/25/2031   Pneumococcal Vaccine for age over 34  Completed   Flu Shot  Completed   Zoster (Shingles) Vaccine  Completed   Meningitis B Vaccine  Aged Out   Osteoporosis screening with Bone Density Scan  Discontinued   COVID-19 Vaccine  Discontinued  *Topic was postponed. The date shown is not the original due date.       02/29/2024   10:44 AM  Advanced Directives  Does Patient Have a Medical Advance Directive? No  Would patient like information on creating a medical advance directive? No - Patient declined    Vision: Annual vision screenings are  recommended for early detection of glaucoma, cataracts, and diabetic retinopathy. These exams can also reveal signs of chronic conditions such as diabetes and high blood pressure.  Dental: Annual dental screenings help detect early signs of oral cancer, gum disease, and other conditions linked to overall health, including heart disease and diabetes.  Please see the attached documents for additional preventive care recommendations.

## 2024-02-29 NOTE — Progress Notes (Addendum)
 "  Chief Complaint  Patient presents with   Annual Exam   Medicare Wellness     Subjective:   [name] is a 83 y.o. female who presents for a Medicare Annual Wellness Visit.  Visit info / Clinical Intake: Medicare Wellness Visit Type:: Subsequent Annual Wellness Visit Medicare Wellness Visit Mode:: In-person (required for WTM) Interpreter Needed?: No Pre-visit prep was completed: yes AWV questionnaire completed by patient prior to visit?: no Living arrangements:: with family/others Patient's Overall Health Status Rating: good Typical amount of pain: none Does pain affect daily life?: no Are you currently prescribed opioids?: no  Dietary Habits and Nutritional Risks How many meals a day?: 2 Eats fruit and vegetables daily?: yes Most meals are obtained by: preparing own meals In the last 2 weeks, have you had any of the following?: none Diabetic:: (!) yes Any non-healing wounds?: no How often do you check your BS?: 0 Would you like to be referred to a Nutritionist or for Diabetic Management? : no  Functional Status Activities of Daily Living (to include ambulation/medication): Independent Ambulation: Independent Medication Administration: Independent Home Management (perform basic housework or laundry): Independent Manage your own finances?: yes Primary transportation is: driving Concerns about vision?: no *vision screening is required for WTM* Concerns about hearing?: no  Fall Screening Falls in the past year?: 0 Number of falls in past year: 0 Was there an injury with Fall?: 0 Fall Risk Category Calculator: 0 Patient Fall Risk Level: Low Fall Risk  Fall Risk Patient at Risk for Falls Due to: No Fall Risks Fall risk Follow up: Falls prevention discussed; Education provided; Falls evaluation completed  Home and Transportation Safety: All rugs have non-skid backing?: yes All stairs or steps have railings?: yes Grab bars in the bathtub or shower?: (!) no Have  non-skid surface in bathtub or shower?: yes Good home lighting?: yes Regular seat belt use?: yes Hospital stays in the last year:: (!) no; yes How many hospital stays:: 1 Reason: L hip surgery  Cognitive Assessment Difficulty concentrating, remembering, or making decisions? : no Will 6CIT or Mini Cog be Completed: yes What year is it?: 0 points What month is it?: 0 points About what time is it?: 0 points Count backwards from 20 to 1: 4 points Say the months of the year in reverse: 4 points Repeat the address phrase from earlier: 0 points 6 CIT Score: 8 points  Advance Directives (For Healthcare) Does Patient Have a Medical Advance Directive?: No Would patient like information on creating a medical advance directive?: No - Patient declined  Reviewed/Updated  Reviewed/Updated: Reviewed All (Medical, Surgical, Family, Medications, Allergies, Care Teams, Patient Goals)    Allergies (verified) Prinivil [lisinopril]   Current Medications (verified) Outpatient Encounter Medications as of 02/29/2024  Medication Sig   amLODipine  (NORVASC ) 5 MG tablet TAKE 1 TABLET BY MOUTH EVERY DAY   azithromycin  (ZITHROMAX ) 250 MG tablet Take 2 tablets on day 1, then 1 tablet daily on days 2 through 5   cyclobenzaprine  (FLEXERIL ) 10 MG tablet Take 1 tablet (10 mg total) by mouth 2 (two) times daily as needed for muscle spasms.   levothyroxine  (SYNTHROID ) 75 MCG tablet TAKE 1 TABLET BY MOUTH ONCE DAILY BEFORE BREAKFAST   metoprolol  succinate (TOPROL -XL) 25 MG 24 hr tablet TAKE 1 TABLET BY MOUTH DAILY   ondansetron  (ZOFRAN ) 4 MG tablet Take 1 tablet (4 mg total) by mouth every 6 (six) hours as needed for nausea.   potassium chloride  (KLOR-CON ) 10 MEQ tablet TAKE  1 TABLET BY MOUTH ONCE DAILY   simvastatin  (ZOCOR ) 20 MG tablet TAKE 1 TABLET BY MOUTH AT BEDTIME   simvastatin  (ZOCOR ) 20 MG tablet TAKE 1 TABLET BY MOUTH AT BEDTIME   torsemide  (DEMADEX ) 20 MG tablet TAKE 1 TABLET BY MOUTH ONCE DAILY    [DISCONTINUED] HYDROcodone -acetaminophen  (NORCO/VICODIN) 5-325 MG tablet One tab by mouth every 8 hours as needed for musculoskeletal pain   [DISCONTINUED] methocarbamol  (ROBAXIN ) 500 MG tablet Take 1 tablet (500 mg total) by mouth every 6 (six) hours as needed for muscle spasms.   HYDROcodone -acetaminophen  (NORCO/VICODIN) 5-325 MG tablet One tab by mouth every 8 hours as needed for musculoskeletal pain   [DISCONTINUED] traMADol  (ULTRAM ) 50 MG tablet Take 1 tablet (50 mg total) by mouth every 6 (six) hours as needed.   No facility-administered encounter medications on file as of 02/29/2024.    History: Past Medical History:  Diagnosis Date   Anxiety    Arthritis    ostearthritis. Degenerative disc disease- back, knees, hands   Back pain    Cataract 11/2016   hx - surgery to remove   Hyperlipidemia    Hypertension    Hypothyroidism    Pre-diabetes    Renal insufficiency    Renal insufficiency    Rotator cuff disorder    Pain with limited ROM right, spur left shoulder   Thyroid  disease    Past Surgical History:  Procedure Laterality Date   CATARACT EXTRACTION, BILATERAL Bilateral 11/2016   INJECTION KNEE Right 07/16/2015   Procedure: RIGHT KNEE INJECTION;  Surgeon: Dempsey Moan, MD;  Location: [facility];  Service: Orthopedics;  Laterality: Right;   JOINT REPLACEMENT Right 10/2008   RTHA-hip   REVERSE SHOULDER ARTHROPLASTY Left 08/15/2016   Procedure: REVERSE LEFT SHOULDER ARTHROPLASTY;  Surgeon: Kay Kemps, MD;  Location: [facility] OR;  Service: Orthopedics;  Laterality: Left;   TOTAL HIP ARTHROPLASTY Left 02/09/2023   Procedure: TOTAL HIP ARTHROPLASTY ANTERIOR APPROACH;  Surgeon: Moan Dempsey, MD;  Location: [facility];  Service: Orthopedics;  Laterality: Left;   TOTAL KNEE ARTHROPLASTY Left 07/16/2015   Procedure: LEFT TOTAL KNEE ARTHROPLASTY;  Surgeon: Dempsey Moan, MD;  Location: [facility];  Service: Orthopedics;  Laterality: Left;   TOTAL KNEE ARTHROPLASTY Right 01/26/2017   Procedure:  RIGHT TOTAL KNEE ARTHROPLASTY;  Surgeon: Moan Dempsey, MD;  Location: [facility];  Service: Orthopedics;  Laterality: Right;   Family History  Problem Relation Age of Onset   Hypertension Mother    Stroke Mother    Cancer Father        Prostate cancer   Cancer Brother        Lung cancer   Hypertension Sister    Hypertension Child    Diabetes Child    Colon cancer Neg Hx    Esophageal cancer Neg Hx    Stomach cancer Neg Hx    Pancreatic cancer Neg Hx    Social History   Occupational History   Not on file  Tobacco Use   Smoking status: Never   Smokeless tobacco: Never  Vaping Use   Vaping status: Never Used  Substance and Sexual Activity   Alcohol use: No   Drug use: No   Sexual activity: Not Currently    Birth control/protection: Post-menopausal   Tobacco Counseling Counseling given: No  SDOH Screenings   Food Insecurity: No Food Insecurity (02/29/2024)  Housing: Low Risk (02/29/2024)  Transportation Needs: No Transportation Needs (02/29/2024)  Utilities: Not At Risk (02/29/2024)  Alcohol Screen: Low Risk (02/29/2024)  Depression (PHQ2-9): Low Risk (06/22/2023)  Financial Resource Strain: Low Risk (02/29/2024)  Physical Activity: Insufficiently Active (02/29/2024)  Social Connections: Moderately Integrated (02/29/2024)  Stress: No Stress Concern Present (02/29/2024)  Tobacco Use: Low Risk (02/29/2024)  Health Literacy: Adequate Health Literacy (02/29/2024)   See flowsheets for full screening details  Depression Screen PHQ 2 & 9 Depression Scale- Over the past 2 weeks, how often have you been bothered by any of the following problems? Little interest or pleasure in doing things: 0 Feeling down, depressed, or hopeless (PHQ Adolescent also includes...irritable): 0 PHQ-2 Total Score: 0     Goals Addressed               This Visit's Progress     DIET - REDUCE CALORIE INTAKE (pt-stated)               Objective:    Today's Vitals   02/29/24 1050   BP: 130/80  Pulse: 72  SpO2: 98%  Weight: 224 lb (101.6 kg)  Height: 5' 2.5 (1.588 m)   Body mass index is 40.32 kg/m.  Hearing/Vision screen No results found. Immunizations and Health Maintenance Health Maintenance  Topic Date Due   Diabetic kidney evaluation - Urine ACR  04/19/2023   HEMOGLOBIN A1C  12/22/2023   Diabetic kidney evaluation - eGFR measurement  02/10/2024   Colonoscopy  06/21/2024 (Originally 12/18/2021)   FOOT EXAM  06/21/2024   OPHTHALMOLOGY EXAM  08/19/2024   Medicare Annual Wellness (AWV)  02/28/2025   DTaP/Tdap/Td (3 - Td or Tdap) 05/25/2031   Pneumococcal Vaccine: 50+ Years  Completed   Influenza Vaccine  Completed   Zoster Vaccines- Shingrix  Completed   Meningococcal B Vaccine  Aged Out   Bone Density Scan  Discontinued   COVID-19 Vaccine  Discontinued        Assessment/Plan:  This is a routine wellness examination for [facility].  Patient Care Team: Perri Ronal PARAS, MD as PCP - General (Internal Medicine)  I have personally reviewed and noted the following in the patients chart:   Medical and social history Use of alcohol, tobacco or illicit drugs  Current medications and supplements including opioid prescriptions. Functional ability and status Nutritional status Physical activity Advanced directives List of other physicians Hospitalizations, surgeries, and ER visits in previous 12 months Vitals Screenings to include cognitive, depression, and falls Referrals and appointments  Orders Placed This Encounter  Procedures   CBC with Differential/Platelet   Comprehensive metabolic panel with GFR   Hemoglobin A1c   Lipid panel   Microalbumin / creatinine urine ratio   TSH   In addition, I have reviewed and discussed with patient certain preventive protocols, quality metrics, and best practice recommendations. A written personalized care plan for preventive services as well as general preventive health recommendations were provided to  patient.   Reis Goga Zelda, CMA   02/29/2024   Return in 1 year (on 02/28/2025).  After Visit Summary: (In Person-Printed) AVS printed and given to the patient  Nurse Notes: none  I, Ronal PARAS Perri, MD, have reviewed all documentation for this visit. The documentation on 02/29/2024 for the exam, diagnosis, procedures, and orders are all accurate and complete.  "

## 2024-03-01 ENCOUNTER — Ambulatory Visit: Payer: Self-pay | Admitting: Internal Medicine

## 2024-03-01 LAB — COMPREHENSIVE METABOLIC PANEL WITH GFR
AG Ratio: 0.9 (calc) — ABNORMAL LOW (ref 1.0–2.5)
ALT: 6 U/L (ref 6–29)
AST: 16 U/L (ref 10–35)
Albumin: 4.2 g/dL (ref 3.6–5.1)
Alkaline phosphatase (APISO): 67 U/L (ref 37–153)
BUN/Creatinine Ratio: 14 (calc) (ref 6–22)
BUN: 19 mg/dL (ref 7–25)
CO2: 31 mmol/L (ref 20–32)
Calcium: 10.4 mg/dL (ref 8.6–10.4)
Chloride: 98 mmol/L (ref 98–110)
Creat: 1.39 mg/dL — ABNORMAL HIGH (ref 0.60–0.95)
Globulin: 4.5 g/dL — ABNORMAL HIGH (ref 1.9–3.7)
Glucose, Bld: 111 mg/dL — ABNORMAL HIGH (ref 65–99)
Potassium: 4.5 mmol/L (ref 3.5–5.3)
Sodium: 136 mmol/L (ref 135–146)
Total Bilirubin: 0.5 mg/dL (ref 0.2–1.2)
Total Protein: 8.7 g/dL — ABNORMAL HIGH (ref 6.1–8.1)
eGFR: 38 mL/min/1.73m2 — ABNORMAL LOW

## 2024-03-01 LAB — CBC WITH DIFFERENTIAL/PLATELET
Absolute Lymphocytes: 2418 {cells}/uL (ref 850–3900)
Absolute Monocytes: 553 {cells}/uL (ref 200–950)
Basophils Absolute: 111 {cells}/uL (ref 0–200)
Basophils Relative: 1.7 %
Eosinophils Absolute: 78 {cells}/uL (ref 15–500)
Eosinophils Relative: 1.2 %
HCT: 67.8 % — ABNORMAL HIGH (ref 35.9–46.0)
Hemoglobin: 22.3 g/dL — ABNORMAL HIGH (ref 11.7–15.5)
MCH: 31.7 pg (ref 27.0–33.0)
MCHC: 32.9 g/dL (ref 31.6–35.4)
MCV: 96.3 fL (ref 81.4–101.7)
MPV: 10.6 fL (ref 7.5–12.5)
Monocytes Relative: 8.5 %
Neutro Abs: 3341 {cells}/uL (ref 1500–7800)
Neutrophils Relative %: 51.4 %
Platelets: 154 Thousand/uL (ref 140–400)
RBC: 7.04 Million/uL — ABNORMAL HIGH (ref 3.80–5.10)
RDW: 13 % (ref 11.0–15.0)
Total Lymphocyte: 37.2 %
WBC: 6.5 Thousand/uL (ref 3.8–10.8)

## 2024-03-01 LAB — LIPID PANEL
Cholesterol: 225 mg/dL — ABNORMAL HIGH
HDL: 54 mg/dL
LDL Cholesterol (Calc): 145 mg/dL — ABNORMAL HIGH
Non-HDL Cholesterol (Calc): 171 mg/dL — ABNORMAL HIGH
Total CHOL/HDL Ratio: 4.2 (calc)
Triglycerides: 133 mg/dL

## 2024-03-01 LAB — MICROALBUMIN / CREATININE URINE RATIO
Creatinine, Urine: 178 mg/dL (ref 20–275)
Microalb Creat Ratio: 2 mg/g{creat}
Microalb, Ur: 0.4 mg/dL

## 2024-03-01 LAB — HEMOGLOBIN A1C
Hgb A1c MFr Bld: 5.8 % — ABNORMAL HIGH
Mean Plasma Glucose: 120 mg/dL
eAG (mmol/L): 6.6 mmol/L

## 2024-03-01 LAB — TSH: TSH: 2.48 m[IU]/L (ref 0.40–4.50)

## 2024-03-05 ENCOUNTER — Encounter: Payer: Self-pay | Admitting: Internal Medicine

## 2024-03-07 ENCOUNTER — Other Ambulatory Visit

## 2024-03-07 DIAGNOSIS — N1831 Chronic kidney disease, stage 3a: Secondary | ICD-10-CM

## 2024-03-07 LAB — CBC WITH DIFFERENTIAL/PLATELET
Absolute Lymphocytes: 1808 {cells}/uL (ref 850–3900)
Absolute Monocytes: 469 {cells}/uL (ref 200–950)
Basophils Absolute: 18 {cells}/uL (ref 0–200)
Basophils Relative: 0.4 %
Eosinophils Absolute: 78 {cells}/uL (ref 15–500)
Eosinophils Relative: 1.7 %
HCT: 38.6 % (ref 35.9–46.0)
Hemoglobin: 12.6 g/dL (ref 11.7–15.5)
MCH: 32 pg (ref 27.0–33.0)
MCHC: 32.6 g/dL (ref 31.6–35.4)
MCV: 98 fL (ref 81.4–101.7)
MPV: 10.6 fL (ref 7.5–12.5)
Monocytes Relative: 10.2 %
Neutro Abs: 2226 {cells}/uL (ref 1500–7800)
Neutrophils Relative %: 48.4 %
Platelets: 245 Thousand/uL (ref 140–400)
RBC: 3.94 Million/uL (ref 3.80–5.10)
RDW: 12.9 % (ref 11.0–15.0)
Total Lymphocyte: 39.3 %
WBC: 4.6 Thousand/uL (ref 3.8–10.8)

## 2024-03-07 MED ORDER — AZITHROMYCIN 250 MG PO TABS: ORAL_TABLET | ORAL | 0 refills | Status: AC

## 2024-03-07 MED ORDER — HYDROCODONE BIT-HOMATROP MBR 5-1.5 MG/5ML PO SOLN: 5.0000 mL | Freq: Four times a day (QID) | ORAL | 0 refills | Status: AC | PRN

## 2024-03-07 NOTE — Patient Instructions (Signed)
 Will contact you with repeat lab results. Sending in Zithromax  and Hycodan for URI, MJB, MD

## 2024-03-07 NOTE — Progress Notes (Signed)
 Patient came in for  repeat labs. but also has URI. Sending in Hycodan Cough syrup and Zithromax  Z pak for her. She is not febrile. Recent CBC with diff was abnormal and is possibly a lab error. HGB was reported as 22.3 g which does not female sense given previous results.

## 2024-03-08 ENCOUNTER — Ambulatory Visit: Payer: Self-pay | Admitting: Internal Medicine

## 2024-03-24 ENCOUNTER — Telehealth: Payer: Self-pay

## 2024-03-24 NOTE — Telephone Encounter (Signed)
 I am not sure what type of referral we need to place?   Copied from CRM [id number]. Topic: Referral - Request for Referral >> Mar 24, 2024  9:56 AM Tiffini S wrote: Did the patient discuss referral with their provider in the last year? Yes (If No - schedule appointment) (If Yes - send message)  Appointment offered? No  Type of order/referral and detailed reason for visit: patient had a flu shot on 12/12/23 with [facility] [phone] and the claim was denied by [facility] [phone] due to [facility] plan- need a referral to reprocess the claim  Preference of office, provider, location: [facility]   If referral order, have you been seen by this specialty before? Yes (If Yes, this issue or another issue? When? Where?  Can we respond through MyChart? No, please call the patient at [phone]

## 2024-03-25 ENCOUNTER — Other Ambulatory Visit: Payer: Self-pay

## 2024-03-25 ENCOUNTER — Telehealth: Payer: Self-pay | Admitting: Internal Medicine

## 2024-03-25 ENCOUNTER — Encounter: Payer: Self-pay | Admitting: Internal Medicine

## 2024-03-25 NOTE — Telephone Encounter (Signed)
 [employer] is not allowing me to place an order to [facility]. Would one of you please call [employer] and ask what needs to be done. I have never heard of this before.

## 2024-03-25 NOTE — Telephone Encounter (Signed)
"  Daughter was notified.   "

## 2024-03-25 NOTE — Telephone Encounter (Signed)
 Spoke with [employer] rep a flu vaccine does not require pre authorization reference number [id number]

## 2024-08-29 ENCOUNTER — Ambulatory Visit: Admitting: Internal Medicine
